# Patient Record
Sex: Female | Born: 1990 | Race: Black or African American | Hispanic: No | Marital: Single | Smoking: Never smoker
Health system: Southern US, Community
[De-identification: ages and names within clinical notes are randomized; demographics above are authoritative.]

## PROBLEM LIST (undated history)

## (undated) ENCOUNTER — Inpatient Hospital Stay (HOSPITAL_COMMUNITY): Payer: Self-pay

## (undated) DIAGNOSIS — Z862 Personal history of diseases of the blood and blood-forming organs and certain disorders involving the immune mechanism: Secondary | ICD-10-CM

## (undated) DIAGNOSIS — R51 Headache: Secondary | ICD-10-CM

## (undated) DIAGNOSIS — A749 Chlamydial infection, unspecified: Secondary | ICD-10-CM

## (undated) DIAGNOSIS — F909 Attention-deficit hyperactivity disorder, unspecified type: Secondary | ICD-10-CM

## (undated) DIAGNOSIS — O139 Gestational [pregnancy-induced] hypertension without significant proteinuria, unspecified trimester: Secondary | ICD-10-CM

## (undated) DIAGNOSIS — R519 Headache, unspecified: Secondary | ICD-10-CM

## (undated) DIAGNOSIS — O24419 Gestational diabetes mellitus in pregnancy, unspecified control: Secondary | ICD-10-CM

## (undated) DIAGNOSIS — I10 Essential (primary) hypertension: Secondary | ICD-10-CM

## (undated) HISTORY — PX: INDUCED ABORTION: SHX677

## (undated) HISTORY — DX: Attention-deficit hyperactivity disorder, unspecified type: F90.9

---
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
## 2008-03-18 ENCOUNTER — Emergency Department (HOSPITAL_COMMUNITY): Admission: EM | Admit: 2008-03-18 | Discharge: 2008-03-18 | Payer: Self-pay | Admitting: Emergency Medicine

## 2010-02-06 ENCOUNTER — Emergency Department (HOSPITAL_COMMUNITY)
Admission: EM | Admit: 2010-02-06 | Discharge: 2010-02-06 | Payer: Self-pay | Source: Home / Self Care | Admitting: Emergency Medicine

## 2010-04-28 LAB — POCT PREGNANCY, URINE: Preg Test, Ur: NEGATIVE

## 2010-11-06 LAB — POCT URINALYSIS DIP (DEVICE)
Glucose, UA: NEGATIVE
Hgb urine dipstick: NEGATIVE
Nitrite: NEGATIVE
Operator id: 247071
Specific Gravity, Urine: 1.025
Urobilinogen, UA: 1

## 2010-11-06 LAB — POCT PREGNANCY, URINE
Operator id: 247071
Preg Test, Ur: POSITIVE

## 2010-11-10 LAB — GC/CHLAMYDIA PROBE AMP, GENITAL: GC Probe Amp, Genital: POSITIVE — AB

## 2010-11-10 LAB — URINALYSIS, ROUTINE W REFLEX MICROSCOPIC
Glucose, UA: NEGATIVE
Hgb urine dipstick: NEGATIVE
Protein, ur: NEGATIVE
pH: 8

## 2010-11-10 LAB — URINE MICROSCOPIC-ADD ON

## 2010-11-10 LAB — WET PREP, GENITAL: Trich, Wet Prep: NONE SEEN

## 2010-11-13 LAB — URINALYSIS, ROUTINE W REFLEX MICROSCOPIC
Glucose, UA: NEGATIVE
Ketones, ur: NEGATIVE
Leukocytes, UA: NEGATIVE
Nitrite: NEGATIVE
Protein, ur: 30 — AB
Urobilinogen, UA: 0.2

## 2010-11-13 LAB — URINE MICROSCOPIC-ADD ON

## 2010-11-14 LAB — WET PREP, GENITAL: Trich, Wet Prep: NONE SEEN

## 2010-11-14 LAB — URINE MICROSCOPIC-ADD ON

## 2010-11-14 LAB — URINALYSIS, ROUTINE W REFLEX MICROSCOPIC
Glucose, UA: NEGATIVE
Ketones, ur: NEGATIVE
Protein, ur: NEGATIVE
Urobilinogen, UA: 0.2

## 2010-11-14 LAB — RAPID URINE DRUG SCREEN, HOSP PERFORMED
Amphetamines: NOT DETECTED
Benzodiazepines: NOT DETECTED
Cocaine: NOT DETECTED

## 2010-12-02 LAB — POCT PREGNANCY, URINE: Preg Test, Ur: NEGATIVE

## 2011-02-17 NOTE — L&D Delivery Note (Signed)
Delivery Note At 4:22 PM a viable female was delivered via  (Presentation: ;  ).  APGAR: , ; weight .   Placenta status: , .  Cord:  with the following complications: .  Cord pH: not done  Anesthesia:   Episiotomy:  Lacerations:  Suture Repair: 2.0 Est. Blood Loss (mL):   Mom to postpartum.  Baby to nursery-stable.  Hadyn Blanck A 08/15/2011, 4:42 PM

## 2011-03-23 ENCOUNTER — Inpatient Hospital Stay (HOSPITAL_COMMUNITY)
Admission: AD | Admit: 2011-03-23 | Discharge: 2011-03-23 | Disposition: A | Discharge status: Home / Self Care | Payer: Medicaid Other | Source: Ambulatory Visit | Attending: Family Medicine | Admitting: Family Medicine

## 2011-03-23 ENCOUNTER — Encounter (HOSPITAL_COMMUNITY): Payer: Self-pay

## 2011-03-23 ENCOUNTER — Inpatient Hospital Stay (HOSPITAL_COMMUNITY): Payer: Medicaid Other

## 2011-03-23 DIAGNOSIS — R109 Unspecified abdominal pain: Secondary | ICD-10-CM | POA: Insufficient documentation

## 2011-03-23 DIAGNOSIS — O99891 Other specified diseases and conditions complicating pregnancy: Secondary | ICD-10-CM | POA: Insufficient documentation

## 2011-03-23 DIAGNOSIS — Z3480 Encounter for supervision of other normal pregnancy, unspecified trimester: Secondary | ICD-10-CM

## 2011-03-23 HISTORY — DX: Chlamydial infection, unspecified: A74.9

## 2011-03-23 LAB — URINALYSIS, ROUTINE W REFLEX MICROSCOPIC
Glucose, UA: NEGATIVE mg/dL
Hgb urine dipstick: NEGATIVE
Leukocytes, UA: NEGATIVE
pH: 6.5 (ref 5.0–8.0)

## 2011-03-23 NOTE — ED Provider Notes (Signed)
Monique Reeve Stimpson21 y.o.G3P1011 @UnknownNo  chief complaint on file.   SUBJECTIVE  HPI: Presents with suprapubic abdominal pain and dysuria. She endorses urinary frequency but no urgency or gross hematuria. Denies back pain. She's not had an ultrasound that she had prenatal care in prison in [place] including blood work, pelvic and cultures/Pap. LMP is unsure. States she went to the health department  for her first pregnancy but intends to go to Dr. Gaynell Face for this pregnancy. Denies leakage of fluid, vaginal bleeding or other known pregnancy problems. Reports good fetal movement.  Past Medical History  Diagnosis Date  . Chlamydia    History reviewed. No pertinent past surgical history. History   Social History  . Marital Status: Single    Spouse Name: N/A    Number of Children: N/A  . Years of Education: N/A   Occupational History  . Not on file.   Social History Main Topics  . Smoking status: Never Smoker   . Smokeless tobacco: Never Used  . Alcohol Use: No  . Drug Use: Yes    Special: Marijuana     Last used in October  . Sexually Active: Yes     Intercourse last night   Other Topics Concern  . Not on file   Social History Narrative  . No narrative on file   No current facility-administered medications on file prior to encounter.   No current outpatient prescriptions on file prior to encounter.   Not on File  ROS: Pertinent items in HPI  OBJECTIVE Blood pressure 119/67, pulse 101, temperature 98.3 F (36.8 C), temperature source Oral, resp. rate 16, height 5\' 3"  (1.6 m), weight 64.139 kg (141 lb 6.4 oz), SpO2 99.00%. GENERAL: Well-developed, well-nourished female in no acute distress.  LUNGS: Clear to auscultation bilaterally.  HEART: Regular rate and rhythm. ABDOMEN: Soft, nontender. Fundus at umbilicus. FHR 152 EXTREMITIES: Nontender, no edema EXTERNAL GENITALIA: normal VAGINA: physiologic discharge CERVIX: long/closed/high  Results for orders placed  during the hospital encounter of 03/23/11 (from the past 24 hour(s))  URINALYSIS, ROUTINE W REFLEX MICROSCOPIC     Status: Abnormal   Collection Time   03/23/11  3:20 PM      Component Value Range   Color, Urine YELLOW  YELLOW    APPearance HAZY (*) CLEAR    Specific Gravity, Urine 1.025  1.005 - 1.030    pH 6.5  5.0 - 8.0    Glucose, UA NEGATIVE  NEGATIVE (mg/dL)   Hgb urine dipstick NEGATIVE  NEGATIVE    Bilirubin Urine NEGATIVE  NEGATIVE    Ketones, ur NEGATIVE  NEGATIVE (mg/dL)   Protein, ur NEGATIVE  NEGATIVE (mg/dL)   Urobilinogen, UA 0.2  0.0 - 1.0 (mg/dL)   Nitrite NEGATIVE  NEGATIVE    Leukocytes, UA NEGATIVE  NEGATIVE   [place]: Normal anatomy, normal AFV, cx3.3 cm  ASSESSMENT Normal pregnancy at [redacted]w[redacted]d  PLAN Pregnancy precautions. Continue prenatal vitamins. Call Dr. Gaynell Face for prenatal appointment as soon as possible

## 2011-03-23 NOTE — Progress Notes (Signed)
Patient states her last period was in September but unsure when. Has been in jail for the past 90 days. Wants to know how far pregnant she is. Denies any pain, bleeding or leaking at this time. Has had some lower abdominal pain off and on. Has felt fetal movement.

## 2011-03-24 NOTE — ED Provider Notes (Signed)
Chart reviewed and agree with management and plan.  

## 2011-04-13 ENCOUNTER — Inpatient Hospital Stay (HOSPITAL_COMMUNITY)
Admission: AD | Admit: 2011-04-13 | Discharge: 2011-04-13 | Disposition: A | Discharge status: Home / Self Care | Payer: Medicaid Other | Source: Ambulatory Visit | Attending: Obstetrics | Admitting: Obstetrics

## 2011-04-13 ENCOUNTER — Inpatient Hospital Stay (HOSPITAL_COMMUNITY): Payer: Medicaid Other

## 2011-04-13 ENCOUNTER — Encounter (HOSPITAL_COMMUNITY): Payer: Self-pay | Admitting: *Deleted

## 2011-04-13 DIAGNOSIS — O99891 Other specified diseases and conditions complicating pregnancy: Secondary | ICD-10-CM | POA: Insufficient documentation

## 2011-04-13 DIAGNOSIS — R51 Headache: Secondary | ICD-10-CM | POA: Insufficient documentation

## 2011-04-13 LAB — URINE MICROSCOPIC-ADD ON

## 2011-04-13 LAB — CBC
Hemoglobin: 9.5 g/dL — ABNORMAL LOW (ref 12.0–15.0)
RBC: 3.32 MIL/uL — ABNORMAL LOW (ref 3.87–5.11)
WBC: 10.1 10*3/uL (ref 4.0–10.5)

## 2011-04-13 LAB — URINALYSIS, ROUTINE W REFLEX MICROSCOPIC
Leukocytes, UA: NEGATIVE
Nitrite: NEGATIVE
Specific Gravity, Urine: 1.025 (ref 1.005–1.030)
pH: 6 (ref 5.0–8.0)

## 2011-04-13 MED ORDER — BUTALBITAL-APAP-CAFFEINE 50-325-40 MG PO TABS
2.0000 | ORAL_TABLET | Freq: Once | ORAL | Status: AC
  Administered 2011-04-13: 2 via ORAL
  Filled 2011-04-13: qty 2

## 2011-04-13 MED ORDER — BUTALBITAL-APAP-CAFFEINE 50-325-40 MG PO TABS: 1.0000 | ORAL_TABLET | Freq: Four times a day (QID) | ORAL | Status: DC | PRN

## 2011-04-13 NOTE — ED Provider Notes (Signed)
History   Pt presents today c/o passing out on Thursday of last week. She states she was told to come to the MAU on that day, but she didn't have a ride. She states she has been doing well and has not had any trouble since that time other than occ HA that comes and goes. She denies blurry vision, CP, SOB, heart palpitations, or any other sx at this time.  Chief Complaint  Patient presents with  . Loss of Consciousness  . Dizziness   HPI  OB History    Grav Para Term Preterm Abortions TAB SAB Ect Mult Living   3 1 1  0 1 1 0 0 0 1      Past Medical History  Diagnosis Date  . Chlamydia     No past surgical history on file.  Family History  Problem Relation Age of Onset  . Anesthesia problems Neg Hx     History  Substance Use Topics  . Smoking status: Never Smoker   . Smokeless tobacco: Never Used  . Alcohol Use: No    Allergies: No Known Allergies  Prescriptions prior to admission  Medication Sig Dispense Refill  . acetaminophen (TYLENOL) 325 MG tablet Take 650 mg by mouth every 6 (six) hours as needed. Patient used this medication for a headache.      . nitrofurantoin, macrocrystal-monohydrate, (MACROBID) 100 MG capsule Take 100 mg by mouth 2 (two) times daily. Pt is on the third day of a 7 day course of medication.        Review of Systems  Constitutional: Negative for fever and chills.  HENT: Negative for hearing loss.   Eyes: Negative for blurred vision, double vision, photophobia, pain and discharge.  Respiratory: Negative for cough, hemoptysis, sputum production, shortness of breath and wheezing.   Cardiovascular: Negative for chest pain and palpitations.  Gastrointestinal: Negative for nausea, vomiting, abdominal pain, diarrhea and constipation.  Genitourinary: Negative for dysuria, urgency, frequency and hematuria.  Neurological: Positive for headaches. Negative for dizziness and tingling.  Psychiatric/Behavioral: Negative for depression and suicidal ideas.    Physical Exam   Blood pressure 108/63, pulse 88, temperature 97.6 F (36.4 C), temperature source Oral, resp. rate 18, height 5\' 4"  (1.626 m), weight 152 lb (68.947 kg), SpO2 100.00%.  Physical Exam  Nursing note and vitals reviewed. Constitutional: She is oriented to person, place, and time. She appears well-developed and well-nourished. No distress.  HENT:  Head: Normocephalic and atraumatic.  Eyes: Conjunctivae and EOM are normal. Pupils are equal, round, and reactive to light.  Neck: Normal range of motion. Neck supple.  GI: Soft. She exhibits no distension and no mass. There is no tenderness. There is no rebound and no guarding.  Neurological: She is alert and oriented to person, place, and time. No cranial nerve deficit. Coordination normal.  Skin: Skin is warm and dry. She is not diaphoretic.  Psychiatric: She has a normal mood and affect. Her behavior is normal. Judgment and thought content normal.    MAU Course  Procedures  On further discussion with pt, she now states that she has had a continuous HA and dizziness since she fell on Thursday. She states she fell and hit her head and she has had the sx since that time. Therefore, will get head CT.  Results for orders placed during the hospital encounter of 04/13/11 (from the past 72 hour(s))  URINALYSIS, ROUTINE W REFLEX MICROSCOPIC     Status: Abnormal   Collection Time  04/13/11 12:00 PM      Component Value Range Comment   Color, Urine YELLOW  YELLOW     APPearance CLOUDY (*) CLEAR     Specific Gravity, Urine 1.025  1.005 - 1.030     pH 6.0  5.0 - 8.0     Glucose, UA NEGATIVE  NEGATIVE (mg/dL)    Hgb urine dipstick NEGATIVE  NEGATIVE     Bilirubin Urine NEGATIVE  NEGATIVE     Ketones, ur NEGATIVE  NEGATIVE (mg/dL)    Protein, ur 30 (*) NEGATIVE (mg/dL)    Urobilinogen, UA 1.0  0.0 - 1.0 (mg/dL)    Nitrite NEGATIVE  NEGATIVE     Leukocytes, UA NEGATIVE  NEGATIVE    URINE MICROSCOPIC-ADD ON     Status: Abnormal    Collection Time   04/13/11 12:00 PM      Component Value Range Comment   Squamous Epithelial / LPF MANY (*) RARE     WBC, UA 0-2  <3 (WBC/hpf)    Bacteria, UA FEW (*) RARE    CBC     Status: Abnormal   Collection Time   04/13/11 12:15 PM      Component Value Range Comment   WBC 10.1  4.0 - 10.5 (K/uL)    RBC 3.32 (*) 3.87 - 5.11 (MIL/uL)    Hemoglobin 9.5 (*) 12.0 - 15.0 (g/dL)    HCT 16.1 (*) 09.6 - 46.0 (%)    MCV 91.3  78.0 - 100.0 (fL)    MCH 28.6  26.0 - 34.0 (pg)    MCHC 31.4  30.0 - 36.0 (g/dL)    RDW 04.5  40.9 - 81.1 (%)    Platelets 184  150 - 400 (K/uL)     Pt HA improved following fioricet. Assessment and Plan  HA: discussed with pt at length. Will give Rx for fioricet. She will f/u with Dr. Gaynell Face. Discussed diet, activity, risks, and precautions.  Clinton Gallant. Monika Chestang III, DrHSc, MPAS, PA-C  04/13/2011, 12:16 PM   Henrietta Hoover, PA 04/13/11 1321

## 2011-04-13 NOTE — Discharge Instructions (Signed)
Headache °Headaches are caused by many different problems. Most commonly, headache is caused by muscle tension from an injury, fatigue, or emotional upset. Excessive muscle contractions in the scalp and neck result in a headache that often feels like a tight band around the head. Tension headaches often have areas of tenderness over the scalp and the back of the neck. These headaches may last for hours, days, or longer, and some may contribute to migraines in those who have migraine problems. °Migraines usually cause a throbbing headache, which is made worse by activity. Sometimes only one side of the head hurts. Nausea, vomiting, eye pain, and avoidance of food are common with migraines. Visual symptoms such as light sensitivity, blind spots, or flashing lights may also occur. Loud noises may worsen migraine headaches. Many factors may cause migraine headaches: °· Emotional stress, lack of sleep, and menstrual periods.  °· Alcohol and some drugs (such as birth control pills).  °· Diet factors (fasting, caffeine, food preservatives, chocolate).  °· Environmental factors (weather changes, bright lights, odors, smoke).  °Other causes of headaches include minor injuries to the head. Arthritis in the neck; problems with the jaw, eyes, ears, or nose are also causes of headaches. Allergies, drugs, alcohol, and exposure to smoke can also cause moderate headaches. Rebound headaches can occur if someone uses pain medications for a long period of time and then stops. Less commonly, blood vessel problems in the neck and brain (including stroke) can cause various types of headache. °Treatment of headaches includes medicines for pain and relaxation. Ice packs or heat applied to the back of the head and neck help some people. Massaging the shoulders, neck and scalp are often very useful. Relaxation techniques and stretching can help prevent these headaches. Avoid alcohol and cigarette smoking as these tend to make headaches  worse. Please see your caregiver if your headache is not better in 2 days.  °SEEK IMMEDIATE MEDICAL CARE IF:  °· You develop a high fever, chills, or repeated vomiting.  °· You faint or have difficulty with vision.  °· You develop unusual numbness or weakness of your arms or legs.  °· Relief of pain is inadequate with medication, or you develop severe pain.  °· You develop confusion, or neck stiffness.  °· You have a worsening of a headache or do not obtain relief.  °Document Released: 02/02/2005 Document Revised: 10/15/2010 Document Reviewed: 07/29/2006 °[employer]® Patient Information ©2012 [employer]. °

## 2011-04-13 NOTE — Progress Notes (Addendum)
Light headed since Thurs, fainted on Thurs.  Did not have a ride in.  Happened at school, 'only for a second', hit the back of her head, no tenderness to touch.

## 2011-04-13 NOTE — ED Notes (Signed)
States that she has had her headache and her dizziness since Thursday ( 4 days ago); states that she was not feeling well and was lightheaded on Thursday before she passed out and fell; FHR dopplered at  156; denies any cramping,vaginal discharge or spotting;

## 2011-05-07 LAB — OB RESULTS CONSOLE HEPATITIS B SURFACE ANTIGEN: Hepatitis B Surface Ag: NEGATIVE

## 2011-05-07 LAB — OB RESULTS CONSOLE RPR: RPR: NONREACTIVE

## 2011-05-07 LAB — OB RESULTS CONSOLE ABO/RH: RH Type: POSITIVE

## 2011-05-07 LAB — OB RESULTS CONSOLE HIV ANTIBODY (ROUTINE TESTING): HIV: NONREACTIVE

## 2011-07-09 ENCOUNTER — Encounter (HOSPITAL_COMMUNITY): Payer: Self-pay | Admitting: *Deleted

## 2011-07-09 ENCOUNTER — Inpatient Hospital Stay (HOSPITAL_COMMUNITY)
Admission: AD | Admit: 2011-07-09 | Discharge: 2011-07-09 | Disposition: A | Discharge status: Home / Self Care | Payer: Medicaid Other | Source: Ambulatory Visit | Attending: Obstetrics | Admitting: Obstetrics

## 2011-07-09 DIAGNOSIS — O4700 False labor before 37 completed weeks of gestation, unspecified trimester: Secondary | ICD-10-CM

## 2011-07-09 LAB — URINALYSIS, ROUTINE W REFLEX MICROSCOPIC
Bilirubin Urine: NEGATIVE
Ketones, ur: NEGATIVE mg/dL
Nitrite: NEGATIVE
Protein, ur: NEGATIVE mg/dL
Urobilinogen, UA: 0.2 mg/dL (ref 0.0–1.0)

## 2011-07-09 NOTE — MAU Provider Note (Signed)
Emeterio Reeve Stimpson21 y.o.G3P1011 @[redacted]w[redacted]d  by LMP No chief complaint on file.    First Provider Initiated Contact with Patient 07/09/11 2152      SUBJECTIVE  HPI: Pt presents to MAU with contractions.  She initially says contractions are 5 minutes apart but reports she has had 2-3 contractions per hour for last couple of hours.  She reports she has not had much water to drink today.  She reports good fetal movement, denies LOF, vaginal bleeding, vaginal itching/burning, urinary symptoms, h/a, dizziness, n/v, or fever/chills.    Past Medical History  Diagnosis Date  . Chlamydia    Past Surgical History  Procedure Date  . Induced abortion    History   Social History  . Marital Status: Single    Spouse Name: N/A    Number of Children: N/A  . Years of Education: N/A   Occupational History  . Not on file.   Social History Main Topics  . Smoking status: Never Smoker   . Smokeless tobacco: Never Used  . Alcohol Use: No  . Drug Use: Yes    Special: Marijuana     Last used in October  . Sexually Active: Yes     Intercourse last night   Other Topics Concern  . Not on file   Social History Narrative  . No narrative on file   No current facility-administered medications on file prior to encounter.   Current Outpatient Prescriptions on File Prior to Encounter  Medication Sig Dispense Refill  . acetaminophen (TYLENOL) 325 MG tablet Take 650 mg by mouth every 6 (six) hours as needed. Patient used this medication for a headache.       No Known Allergies  ROS: Pertinent items in HPI  OBJECTIVE Blood pressure 114/62, pulse 95, temperature 98.4 F (36.9 C), temperature source Oral, resp. rate 20, height 5\' 5"  (1.651 m), weight 76.431 kg (168 lb 8 oz).  GENERAL: Well-developed, well-nourished female in no acute distress.  HEENT: Normocephalic, good dentition HEART: normal rate RESP: normal effort ABDOMEN: Soft, nontender EXTREMITIES: Nontender, no edema NEURO: Alert and  oriented  Cervix 1/30/high, soft, posterior  FHR baseline 135 with moderate variability, positive accels, no decels [place] with no contractions, pt denies contractions since monitor placed  LAB RESULTS Results for orders placed during the hospital encounter of 07/09/11 (from the past 24 hour(s))  URINALYSIS, ROUTINE W REFLEX MICROSCOPIC     Status: Normal   Collection Time   07/09/11  9:30 PM      Component Value Range   Color, Urine YELLOW  YELLOW    APPearance CLEAR  CLEAR    Specific Gravity, Urine 1.010  1.005 - 1.030    pH 8.0  5.0 - 8.0    Glucose, UA NEGATIVE  NEGATIVE (mg/dL)   Hgb urine dipstick NEGATIVE  NEGATIVE    Bilirubin Urine NEGATIVE  NEGATIVE    Ketones, ur NEGATIVE  NEGATIVE (mg/dL)   Protein, ur NEGATIVE  NEGATIVE (mg/dL)   Urobilinogen, UA 0.2  0.0 - 1.0 (mg/dL)   Nitrite NEGATIVE  NEGATIVE    Leukocytes, UA NEGATIVE  NEGATIVE      ASSESSMENT Threatened preterm labor  PLAN D/C home per Dr Gaynell Face with labor precautions F/U in office Return to MAU as needed   LEFTWICH-KIRBY, Destenee Guerry 07/09/2011 9:54 PM

## 2011-07-09 NOTE — Discharge Instructions (Signed)
Pregnancy - Third Trimester The third trimester of pregnancy (the last 3 months) is a period of the most rapid growth for you and your baby. The baby approaches a length of 20 inches and a weight of 6 to 10 pounds. The baby is adding on fat and getting ready for life outside your body. While inside, babies have periods of sleeping and waking, suck their thumbs, and hiccups. You can often feel small contractions of the uterus. This is false labor. It is also called Braxton-Hicks contractions. This is like a practice for labor. The usual problems in this stage of pregnancy include more difficulty breathing, swelling of the hands and feet from water retention, and having to urinate more often because of the uterus and baby pressing on your bladder.  PRENATAL EXAMS  Blood work may continue to be done during prenatal exams. These tests are done to check on your health and the probable health of your baby. Blood work is used to follow your blood levels (hemoglobin). Anemia (low hemoglobin) is common during pregnancy. Iron and vitamins are given to help prevent this. You may also continue to be checked for diabetes. Some of the past blood tests may be done again.   The size of the uterus is measured during each visit. This makes sure your baby is growing properly according to your pregnancy dates.   Your blood pressure is checked every prenatal visit. This is to make sure you are not getting toxemia.   Your urine is checked every prenatal visit for infection, diabetes and protein.   Your weight is checked at each visit. This is done to make sure gains are happening at the suggested rate and that you and your baby are growing normally.   Sometimes, an ultrasound is performed to confirm the position and the proper growth and development of the baby. This is a test done that bounces harmless sound waves off the baby so your caregiver can more accurately determine due dates.   Discuss the type of pain  medication and anesthesia you will have during your labor and delivery.   Discuss the possibility and anesthesia if a Cesarean Section might be necessary.   Inform your caregiver if there is any mental or physical violence at home.  Sometimes, a specialized non-stress test, contraction stress test and biophysical profile are done to make sure the baby is not having a problem. Checking the amniotic fluid surrounding the baby is called an amniocentesis. The amniotic fluid is removed by sticking a needle into the belly (abdomen). This is sometimes done near the end of pregnancy if an early delivery is required. In this case, it is done to help make sure the baby's lungs are mature enough for the baby to live outside of the womb. If the lungs are not mature and it is unsafe to deliver the baby, an injection of cortisone medication is given to the mother 1 to 2 days before the delivery. This helps the baby's lungs mature and makes it safer to deliver the baby. CHANGES OCCURING IN THE THIRD TRIMESTER OF PREGNANCY Your body goes through many changes during pregnancy. They vary from person to person. Talk to your caregiver about changes you notice and are concerned about.  During the last trimester, you have probably had an increase in your appetite. It is normal to have cravings for certain foods. This varies from person to person and pregnancy to pregnancy.   You may begin to get stretch marks on your hips,   abdomen, and breasts. These are normal changes in the body during pregnancy. There are no exercises or medications to take which prevent this change.   Constipation may be treated with a stool softener or adding bulk to your diet. Drinking lots of fluids, fiber in vegetables, fruits, and whole grains are helpful.   Exercising is also helpful. If you have been very active up until your pregnancy, most of these activities can be continued during your pregnancy. If you have been less active, it is helpful  to start an exercise program such as walking. Consult your caregiver before starting exercise programs.   Avoid all smoking, alcohol, un-prescribed drugs, herbs and "street drugs" during your pregnancy. These chemicals affect the formation and growth of the baby. Avoid chemicals throughout the pregnancy to ensure the delivery of a healthy infant.   Backache, varicose veins and hemorrhoids may develop or get worse.   You will tire more easily in the third trimester, which is normal.   The baby's movements may be stronger and more often.   You may become short of breath easily.   Your belly button may stick out.   A yellow discharge may leak from your breasts called colostrum.   You may have a bloody mucus discharge. This usually occurs a few days to a week before labor begins.  HOME CARE INSTRUCTIONS   Keep your caregiver's appointments. Follow your caregiver's instructions regarding medication use, exercise, and diet.   During pregnancy, you are providing food for you and your baby. Continue to eat regular, well-balanced meals. Choose foods such as meat, fish, milk and other low fat dairy products, vegetables, fruits, and whole-grain breads and cereals. Your caregiver will tell you of the ideal weight gain.   A physical sexual relationship may be continued throughout pregnancy if there are no other problems such as early (premature) leaking of amniotic fluid from the membranes, vaginal bleeding, or belly (abdominal) pain.   Exercise regularly if there are no restrictions. Check with your caregiver if you are unsure of the safety of your exercises. Greater weight gain will occur in the last 2 trimesters of pregnancy. Exercising helps:   Control your weight.   Get you in shape for labor and delivery.   You lose weight after you deliver.   Rest a lot with legs elevated, or as needed for leg cramps or low back pain.   Wear a good support or jogging bra for breast tenderness during  pregnancy. This may help if worn during sleep. Pads or tissues may be used in the bra if you are leaking colostrum.   Do not use hot tubs, steam rooms, or saunas.   Wear your seat belt when driving. This protects you and your baby if you are in an accident.   Avoid raw meat, cat litter boxes and soil used by cats. These carry germs that can cause birth defects in the baby.   It is easier to loose urine during pregnancy. Tightening up and strengthening the pelvic muscles will help with this problem. You can practice stopping your urination while you are going to the bathroom. These are the same muscles you need to strengthen. It is also the muscles you would use if you were trying to stop from passing gas. You can practice tightening these muscles up 10 times a set and repeating this about 3 times per day. Once you know what muscles to tighten up, do not perform these exercises during urination. It is more likely   to cause an infection by backing up the urine.   Ask for help if you have financial, counseling or nutritional needs during pregnancy. Your caregiver will be able to offer counseling for these needs as well as refer you for other special needs.   Make a list of emergency phone numbers and have them available.   Plan on getting help from family or friends when you go home from the hospital.   Make a trial run to the hospital.   Take prenatal classes with the father to understand, practice and ask questions about the labor and delivery.   Prepare the baby's room/nursery.   Do not travel out of the city unless it is absolutely necessary and with the advice of your caregiver.   Wear only low or no heal shoes to have better balance and prevent falling.  MEDICATIONS AND DRUG USE IN PREGNANCY  Take prenatal vitamins as directed. The vitamin should contain 1 milligram of folic acid. Keep all vitamins out of reach of children. Only a couple vitamins or tablets containing iron may be fatal  to a baby or young child when ingested.   Avoid use of all medications, including herbs, over-the-counter medications, not prescribed or suggested by your caregiver. Only take over-the-counter or prescription medicines for pain, discomfort, or fever as directed by your caregiver. Do not use aspirin, ibuprofen (Motrin, Advil, Nuprin) or naproxen (Aleve) unless OK'd by your caregiver.   Let your caregiver also know about herbs you may be using.   Alcohol is related to a number of birth defects. This includes fetal alcohol syndrome. All alcohol, in any form, should be avoided completely. Smoking will cause low birth rate and premature babies.   Street/illegal drugs are very harmful to the baby. They are absolutely forbidden. A baby born to an addicted mother will be addicted at birth. The baby will go through the same withdrawal an adult does.  SEEK MEDICAL CARE IF: You have any concerns or worries during your pregnancy. It is better to call with your questions if you feel they cannot wait, rather than worry about them. DECISIONS ABOUT CIRCUMCISION You may or may not know the sex of your baby. If you know your baby is a boy, it may be time to think about circumcision. Circumcision is the removal of the foreskin of the penis. This is the skin that covers the sensitive end of the penis. There is no proven medical need for this. Often this decision is made on what is popular at the time or based upon religious beliefs and social issues. You can discuss these issues with your caregiver or pediatrician. SEEK IMMEDIATE MEDICAL CARE IF:   An unexplained oral temperature above 102 F (38.9 C) develops, or as your caregiver suggests.   You have leaking of fluid from the vagina (birth canal). If leaking membranes are suspected, take your temperature and tell your caregiver of this when you call.   There is vaginal spotting, bleeding or passing clots. Tell your caregiver of the amount and how many pads are  used.   You develop a bad smelling vaginal discharge with a change in the color from clear to white.   You develop vomiting that lasts more than 24 hours.   You develop chills or fever.   You develop shortness of breath.   You develop burning on urination.   You loose more than 2 pounds of weight or gain more than 2 pounds of weight or as suggested by your   caregiver.   You notice sudden swelling of your face, hands, and feet or legs.   You develop belly (abdominal) pain. Round ligament discomfort is a common non-cancerous (benign) cause of abdominal pain in pregnancy. Your caregiver still must evaluate you.   You develop a severe headache that does not go away.   You develop visual problems, blurred or double vision.   If you have not felt your baby move for more than 1 hour. If you think the baby is not moving as much as usual, eat something with sugar in it and lie down on your left side for an hour. The baby should move at least 4 to 5 times per hour. Call right away if your baby moves less than that.   You fall, are in a car accident or any kind of trauma.   There is mental or physical violence at home.  Document Released: 01/27/2001 Document Revised: 01/22/2011 Document Reviewed: 08/01/2008 [employer] Patient Information 2012 [employer]. 

## 2011-07-09 NOTE — MAU Note (Signed)
PT SAYS WHEN SHE WIPED  AT 12N- SHE SAW  RED BLOOD ON TOILET PAPER.  NO PAD ON.  SAYS IS HAVING UC - STARTED AT 12 NOON.  WENT TO SLEEP - AWOKE AT  830PM- STILL FEELS THEM. WENT TO DR MARSHALL YESTERDAY.

## 2011-07-12 ENCOUNTER — Inpatient Hospital Stay (HOSPITAL_COMMUNITY)
Admission: AD | Admit: 2011-07-12 | Discharge: 2011-07-12 | Disposition: A | Discharge status: Home / Self Care | Payer: Medicaid Other | Source: Ambulatory Visit | Attending: Obstetrics | Admitting: Obstetrics

## 2011-07-12 ENCOUNTER — Encounter (HOSPITAL_COMMUNITY): Payer: Self-pay | Admitting: *Deleted

## 2011-07-12 DIAGNOSIS — O4700 False labor before 37 completed weeks of gestation, unspecified trimester: Secondary | ICD-10-CM | POA: Insufficient documentation

## 2011-07-12 DIAGNOSIS — O479 False labor, unspecified: Secondary | ICD-10-CM

## 2011-07-12 LAB — URINALYSIS, ROUTINE W REFLEX MICROSCOPIC
Bilirubin Urine: NEGATIVE
Hgb urine dipstick: NEGATIVE
Ketones, ur: NEGATIVE mg/dL
Protein, ur: NEGATIVE mg/dL
Specific Gravity, Urine: 1.015 (ref 1.005–1.030)
Urobilinogen, UA: 1 mg/dL (ref 0.0–1.0)

## 2011-07-12 LAB — URINE MICROSCOPIC-ADD ON

## 2011-07-12 NOTE — MAU Provider Note (Signed)
Chief Complaint:  Contractions   None     HPI  Monique Gamble is a 21 y.o. G3P1011 at [redacted]w[redacted]d presenting with contractions. Denies contractions, leakage of fluid or vaginal bleeding. Good fetal movement.   Past Medical History: Past Medical History  Diagnosis Date  . Chlamydia     Past Surgical History: Past Surgical History  Procedure Date  . Induced abortion     Family History: Family History  Problem Relation Age of Onset  . Anesthesia problems Neg Hx   . Hypertension Mother     Social History: History  Substance Use Topics  . Smoking status: Never Smoker   . Smokeless tobacco: Never Used  . Alcohol Use: No    Allergies: No Known Allergies  Meds:  Prescriptions prior to admission  Medication Sig Dispense Refill  . acetaminophen (TYLENOL) 325 MG tablet Take 650 mg by mouth every 6 (six) hours as needed. Patient used this medication for a headache.      . calcium carbonate (TUMS - DOSED IN MG ELEMENTAL CALCIUM) 500 MG chewable tablet Chew 1 tablet by mouth daily. As needed for heartburn      . OVER THE COUNTER MEDICATION Take 1 tablet by mouth daily. Patient takes an over-the-counter chewable multivitamin          Physical Exam  Blood pressure 124/75, pulse 116, temperature 98.3 F (36.8 C), resp. rate 20, height 5\' 5"  (1.651 m), weight 77.202 kg (170 lb 3.2 oz). GENERAL: Well-developed, well-nourished female in no acute distress.  HEENT: normocephalic HEART: normal rate RESP: normal effort ABDOMEN: Soft, nontender, nondistended, gravid.  EXTREMITIES: Nontender, no edema NEURO: alert and oriented  Dilation: 1 Effacement (%): 50 Cervical Position: Posterior Station: -3 Presentation: Vertex Exam by:: Ivonne Andrew CNM    FHT:  Baseline 130 , moderate variability, accelerations present, no decelerations Contractions: q 4-7 mins, mild   Labs:    Imaging:    Assessment: 1. Preterm contractions     Plan: D/C home pr consult w/ Dr.  Tamela Oddi Follow-up Information    Follow up with MARSHALL,BERNARD A, MD. (as scheduled or MAU as needed if symptoms worsen)    Contact information:   [address] [place] [address] [phone]         Medication List  As of 07/12/2011  5:19 AM   CONTINUE taking these medications         acetaminophen 325 MG tablet   Commonly known as: TYLENOL      calcium carbonate 500 MG chewable tablet   Commonly known as: TUMS - dosed in mg elemental calcium      OVER THE COUNTER MEDICATION          PTL precautions, FKCs  Akeria Hedstrom 5/26/20134:22 AM

## 2011-07-12 NOTE — MAU Note (Signed)
OK to d/c efm per V. SMith CNM 

## 2011-07-12 NOTE — MAU Note (Signed)
"  I've been having contractions for about an hour. Have not been able to sleep. Was here the other night with ctxs. "

## 2011-07-12 NOTE — Progress Notes (Signed)
Written and verbal d/c instructions given and understanding voiced. 

## 2011-07-24 ENCOUNTER — Inpatient Hospital Stay (HOSPITAL_COMMUNITY)
Admission: AD | Admit: 2011-07-24 | Discharge: 2011-07-24 | Disposition: A | Discharge status: Home / Self Care | Payer: Medicaid Other | Source: Ambulatory Visit | Attending: Obstetrics | Admitting: Obstetrics

## 2011-07-24 ENCOUNTER — Encounter (HOSPITAL_COMMUNITY): Payer: Self-pay | Admitting: *Deleted

## 2011-07-24 DIAGNOSIS — O4700 False labor before 37 completed weeks of gestation, unspecified trimester: Secondary | ICD-10-CM | POA: Insufficient documentation

## 2011-07-24 LAB — URINALYSIS, ROUTINE W REFLEX MICROSCOPIC
Bilirubin Urine: NEGATIVE
Nitrite: NEGATIVE
Specific Gravity, Urine: 1.015 (ref 1.005–1.030)
pH: 7.5 (ref 5.0–8.0)

## 2011-07-24 LAB — URINE MICROSCOPIC-ADD ON

## 2011-07-24 NOTE — Discharge Instructions (Signed)

## 2011-07-24 NOTE — MAU Note (Signed)
Patient states she is having irregular contractions, about one every hour. Denies any bleeding or leaking and reports good fetal movement.

## 2011-07-27 ENCOUNTER — Encounter (HOSPITAL_COMMUNITY): Payer: Self-pay | Admitting: *Deleted

## 2011-07-27 ENCOUNTER — Inpatient Hospital Stay (HOSPITAL_COMMUNITY)
Admission: AD | Admit: 2011-07-27 | Discharge: 2011-07-27 | Disposition: A | Discharge status: Home / Self Care | Payer: Medicaid Other | Source: Ambulatory Visit | Attending: Obstetrics | Admitting: Obstetrics

## 2011-07-27 DIAGNOSIS — O4700 False labor before 37 completed weeks of gestation, unspecified trimester: Secondary | ICD-10-CM | POA: Insufficient documentation

## 2011-07-27 LAB — URINALYSIS, ROUTINE W REFLEX MICROSCOPIC
Bilirubin Urine: NEGATIVE
Hgb urine dipstick: NEGATIVE
Protein, ur: NEGATIVE mg/dL
Urobilinogen, UA: 1 mg/dL (ref 0.0–1.0)

## 2011-07-27 NOTE — Discharge Instructions (Signed)

## 2011-07-27 NOTE — MAU Note (Signed)
Pt G3 P1 at 36.6wks having contractions all day.  Denies bleeding or leaking.  No problems with pregnancy.

## 2011-08-12 ENCOUNTER — Encounter (HOSPITAL_COMMUNITY): Payer: Self-pay | Admitting: *Deleted

## 2011-08-12 ENCOUNTER — Telehealth (HOSPITAL_COMMUNITY): Payer: Self-pay | Admitting: *Deleted

## 2011-08-12 NOTE — Telephone Encounter (Signed)
Preadmission screen  

## 2011-08-15 ENCOUNTER — Encounter (HOSPITAL_COMMUNITY): Payer: Self-pay

## 2011-08-15 ENCOUNTER — Inpatient Hospital Stay (HOSPITAL_COMMUNITY)
Admission: RE | Admit: 2011-08-15 | Discharge: 2011-08-17 | DRG: 775 | Disposition: A | Discharge status: Home / Self Care | Payer: Medicaid Other | Source: Ambulatory Visit | Attending: Obstetrics | Admitting: Obstetrics

## 2011-08-15 DIAGNOSIS — O99892 Other specified diseases and conditions complicating childbirth: Principal | ICD-10-CM | POA: Diagnosis present

## 2011-08-15 DIAGNOSIS — Z22330 Carrier of Group B streptococcus: Secondary | ICD-10-CM

## 2011-08-15 LAB — CBC
HCT: 30.3 % — ABNORMAL LOW (ref 36.0–46.0)
MCHC: 30.4 g/dL (ref 30.0–36.0)
MCV: 78.9 fL (ref 78.0–100.0)
RDW: 14.8 % (ref 11.5–15.5)

## 2011-08-15 LAB — ABO/RH: ABO/RH(D): O POS

## 2011-08-15 MED ORDER — DIPHENHYDRAMINE HCL 25 MG PO CAPS: 25.0000 mg | ORAL_CAPSULE | Freq: Four times a day (QID) | ORAL | Status: DC | PRN

## 2011-08-15 MED ORDER — PENICILLIN G POTASSIUM 5000000 UNITS IJ SOLR
5.0000 10*6.[IU] | Freq: Once | INTRAVENOUS | Status: AC
  Administered 2011-08-15: 5 10*6.[IU] via INTRAVENOUS
  Filled 2011-08-15: qty 5

## 2011-08-15 MED ORDER — IBUPROFEN 600 MG PO TABS
600.0000 mg | ORAL_TABLET | Freq: Four times a day (QID) | ORAL | Status: DC
  Administered 2011-08-15 – 2011-08-17 (×7): 600 mg via ORAL
  Filled 2011-08-15 (×2): qty 1

## 2011-08-15 MED ORDER — OXYTOCIN BOLUS FROM INFUSION
250.0000 mL | Freq: Once | INTRAVENOUS | Status: DC
  Filled 2011-08-15: qty 500

## 2011-08-15 MED ORDER — TERBUTALINE SULFATE 1 MG/ML IJ SOLN: 0.2500 mg | Freq: Once | INTRAMUSCULAR | Status: DC | PRN

## 2011-08-15 MED ORDER — LANOLIN HYDROUS EX OINT: TOPICAL_OINTMENT | CUTANEOUS | Status: DC | PRN

## 2011-08-15 MED ORDER — PRENATAL MULTIVITAMIN CH
1.0000 | ORAL_TABLET | Freq: Every day | ORAL | Status: DC
  Administered 2011-08-15 – 2011-08-17 (×3): 1 via ORAL
  Filled 2011-08-15 (×3): qty 1

## 2011-08-15 MED ORDER — CITRIC ACID-SODIUM CITRATE 334-500 MG/5ML PO SOLN: 30.0000 mL | ORAL | Status: DC | PRN

## 2011-08-15 MED ORDER — OXYTOCIN 40 UNITS IN LACTATED RINGERS INFUSION - SIMPLE MED
1.0000 m[IU]/min | INTRAVENOUS | Status: DC
  Administered 2011-08-15: 1 m[IU]/min via INTRAVENOUS
  Administered 2011-08-15: 7 m[IU]/min via INTRAVENOUS

## 2011-08-15 MED ORDER — FERROUS SULFATE 325 (65 FE) MG PO TABS
325.0000 mg | ORAL_TABLET | Freq: Two times a day (BID) | ORAL | Status: DC
  Administered 2011-08-15 – 2011-08-17 (×4): 325 mg via ORAL
  Filled 2011-08-15 (×4): qty 1

## 2011-08-15 MED ORDER — LACTATED RINGERS IV SOLN
INTRAVENOUS | Status: DC
  Administered 2011-08-15: 10:00:00 via INTRAVENOUS

## 2011-08-15 MED ORDER — OXYTOCIN 40 UNITS IN LACTATED RINGERS INFUSION - SIMPLE MED
62.5000 mL/h | Freq: Once | INTRAVENOUS | Status: DC
  Filled 2011-08-15: qty 1000

## 2011-08-15 MED ORDER — OXYCODONE-ACETAMINOPHEN 5-325 MG PO TABS: 1.0000 | ORAL_TABLET | ORAL | Status: DC | PRN

## 2011-08-15 MED ORDER — TETANUS-DIPHTH-ACELL PERTUSSIS 5-2.5-18.5 LF-MCG/0.5 IM SUSP
0.5000 mL | Freq: Once | INTRAMUSCULAR | Status: AC
  Administered 2011-08-17: 0.5 mL via INTRAMUSCULAR

## 2011-08-15 MED ORDER — PENICILLIN G POTASSIUM 5000000 UNITS IJ SOLR
2.5000 10*6.[IU] | INTRAVENOUS | Status: DC
  Administered 2011-08-15: 2.5 10*6.[IU] via INTRAVENOUS
  Filled 2011-08-15 (×4): qty 2.5

## 2011-08-15 MED ORDER — ONDANSETRON HCL 4 MG PO TABS: 4.0000 mg | ORAL_TABLET | ORAL | Status: DC | PRN

## 2011-08-15 MED ORDER — SIMETHICONE 80 MG PO CHEW: 80.0000 mg | CHEWABLE_TABLET | ORAL | Status: DC | PRN

## 2011-08-15 MED ORDER — SENNOSIDES-DOCUSATE SODIUM 8.6-50 MG PO TABS: 2.0000 | ORAL_TABLET | Freq: Every day | ORAL | Status: DC

## 2011-08-15 MED ORDER — WITCH HAZEL-GLYCERIN EX PADS: 1.0000 "application " | MEDICATED_PAD | CUTANEOUS | Status: DC | PRN

## 2011-08-15 MED ORDER — LIDOCAINE HCL (PF) 1 % IJ SOLN
30.0000 mL | INTRAMUSCULAR | Status: DC | PRN
  Filled 2011-08-15: qty 30

## 2011-08-15 MED ORDER — IBUPROFEN 600 MG PO TABS
600.0000 mg | ORAL_TABLET | Freq: Four times a day (QID) | ORAL | Status: DC | PRN
  Filled 2011-08-15 (×5): qty 1

## 2011-08-15 MED ORDER — ACETAMINOPHEN 325 MG PO TABS: 650.0000 mg | ORAL_TABLET | ORAL | Status: DC | PRN

## 2011-08-15 MED ORDER — LACTATED RINGERS IV SOLN: 500.0000 mL | INTRAVENOUS | Status: DC | PRN

## 2011-08-15 MED ORDER — FLEET ENEMA 7-19 GM/118ML RE ENEM: 1.0000 | ENEMA | RECTAL | Status: DC | PRN

## 2011-08-15 MED ORDER — ZOLPIDEM TARTRATE 5 MG PO TABS: 5.0000 mg | ORAL_TABLET | Freq: Every evening | ORAL | Status: DC | PRN

## 2011-08-15 MED ORDER — ONDANSETRON HCL 4 MG/2ML IJ SOLN: 4.0000 mg | INTRAMUSCULAR | Status: DC | PRN

## 2011-08-15 MED ORDER — DIBUCAINE 1 % RE OINT: 1.0000 "application " | TOPICAL_OINTMENT | RECTAL | Status: DC | PRN

## 2011-08-15 MED ORDER — BUTORPHANOL TARTRATE 2 MG/ML IJ SOLN
1.0000 mg | INTRAMUSCULAR | Status: DC | PRN
  Administered 2011-08-15: 1 mg via INTRAVENOUS
  Filled 2011-08-15: qty 1

## 2011-08-15 MED ORDER — BENZOCAINE-MENTHOL 20-0.5 % EX AERO
1.0000 "application " | INHALATION_SPRAY | CUTANEOUS | Status: DC | PRN
  Administered 2011-08-15: 1 via TOPICAL
  Filled 2011-08-15: qty 56

## 2011-08-15 MED ORDER — ONDANSETRON HCL 4 MG/2ML IJ SOLN: 4.0000 mg | Freq: Four times a day (QID) | INTRAMUSCULAR | Status: DC | PRN

## 2011-08-15 NOTE — H&P (Signed)
This is Dr. Francoise Ceo dictating the history and physical on  Monique Gamble she's a 21 year old gravida 3 para 1011 at 39+ weeks her EDC 7-13 and she desires induction her cervix is 3 cm 80% and vertex at -3 station amniotomy was performed the fluids clear she is on low-dose Pitocin should've positive GBS and was treated with penicillin Past medical history negative Past surgical history negative Social history negative System review noncontributory Physical exam well-developed female in in early labor HEENT negative Lungs clear to P&A Heart regular rhythm no murmurs no gallops Breasts engorged Abdomen term Pelvic as described above Extremities negative

## 2011-08-15 NOTE — Progress Notes (Signed)
Pt removing monitors--adjusting frequently

## 2011-08-15 NOTE — Progress Notes (Signed)
Pt refusing medication for pain-pt moving frequently r/t pain--difficult to trace at times--pt continue to remove monitors

## 2011-08-15 NOTE — Progress Notes (Signed)
Patient ID: Monique Gamble, female   DOB: 1990-06-16, 21 y.o.   MRN: [id number] I was called to room by nurse as Dr Gaynell Face was in with another delivery.  Infant was crowning and delivered with one push.  Delivery ROA with right hand by face.  Dr Gaynell Face arrived shortly after cord was clamped and cut.  Monique Heritage, DO 08/15/2011 4:34 PM

## 2011-08-16 LAB — CBC
MCH: 24.1 pg — ABNORMAL LOW (ref 26.0–34.0)
MCHC: 30.6 g/dL (ref 30.0–36.0)
RDW: 14.7 % (ref 11.5–15.5)

## 2011-08-16 NOTE — Progress Notes (Signed)
Patient ID: Monique Gamble, female   DOB: 1991/02/05, 21 y.o.   MRN: [id number] Postpartum day one Vital signs normal Fundus firm Lochia moderate Legs negative No complaints doing well

## 2011-08-16 NOTE — Progress Notes (Signed)
Patient was referred for Social Work Consult: * Referral screened out by Visual merchandiser because none of the following criteria appear to apply: ~ History of anxiety/depression during this pregnancy, or of post-partum depression. ~ Diagnosis of anxiety and/or depression within last 3 years ~ History of depression due to pregnancy loss/loss of child  Please contact the Clinical Social Worker if needs arise, or by the patient's request.  Staci Acosta, MSW LCSW 08/16/2011, 8:49 am

## 2011-08-17 NOTE — Discharge Summary (Signed)
Obstetric Discharge Summary Reason for Admission: induction of labor Prenatal Procedures: none Intrapartum Procedures: spontaneous vaginal delivery Postpartum Procedures: none Complications-Operative and Postpartum: none Hemoglobin  Date Value Range Status  08/16/2011 7.7* 12.0 - 15.0 g/dL Final     HCT  Date Value Range Status  08/16/2011 25.2* 36.0 - 46.0 % Final    Physical Exam:  General: alert Lochia: appropriate Uterine Fundus: firm Incision: healing well DVT Evaluation: No evidence of DVT seen on physical exam.  Discharge Diagnoses: Term Pregnancy-delivered  Discharge Information: Date: 08/17/2011 Activity: pelvic rest Diet: routine Medications: Percocet Condition: stable Instructions: refer to practice specific booklet Discharge to: home Follow-up Information    Follow up with Cataleah Stites A, MD. Call in 6 weeks.   Contact information:   [address] [place] [address] [phone]          Newborn Data: Live born female  Birth Weight: 6 lb 12.1 oz (3065 g) APGAR: 7, 9  Home with mother.  Monique Gamble A 08/17/2011, 6:41 AM

## 2011-08-17 NOTE — Progress Notes (Signed)
UR chart review completed.  

## 2011-11-27 ENCOUNTER — Encounter (HOSPITAL_COMMUNITY): Payer: Self-pay | Admitting: *Deleted

## 2011-11-27 ENCOUNTER — Emergency Department (HOSPITAL_COMMUNITY)
Admission: EM | Admit: 2011-11-27 | Discharge: 2011-11-28 | Disposition: A | Discharge status: Home / Self Care | Payer: Medicaid Other | Attending: Emergency Medicine | Admitting: Emergency Medicine

## 2011-11-27 DIAGNOSIS — Z8489 Family history of other specified conditions: Secondary | ICD-10-CM | POA: Insufficient documentation

## 2011-11-27 DIAGNOSIS — K612 Anorectal abscess: Secondary | ICD-10-CM | POA: Insufficient documentation

## 2011-11-27 DIAGNOSIS — Z8249 Family history of ischemic heart disease and other diseases of the circulatory system: Secondary | ICD-10-CM | POA: Insufficient documentation

## 2011-11-27 MED ORDER — LIDOCAINE HCL 1 % IJ SOLN
INTRAMUSCULAR | Status: AC
  Administered 2011-11-28
  Filled 2011-11-27: qty 40

## 2011-11-27 MED ORDER — LORAZEPAM 1 MG PO TABS
1.0000 mg | ORAL_TABLET | Freq: Once | ORAL | Status: AC
  Administered 2011-11-27: 1 mg via ORAL
  Filled 2011-11-27: qty 1

## 2011-11-27 MED ORDER — OXYCODONE-ACETAMINOPHEN 5-325 MG PO TABS
1.0000 | ORAL_TABLET | Freq: Once | ORAL | Status: AC
Start: 2011-11-27 — End: 2011-11-27
  Administered 2011-11-27: 1 via ORAL
  Filled 2011-11-27: qty 1

## 2011-11-27 NOTE — ED Provider Notes (Signed)
History     CSN: [id number]  Arrival date & time 11/27/11  2024   First MD Initiated Contact with Patient 11/27/11 2119      Chief Complaint  Patient presents with  . Recurrent Skin Infections    (Consider location/radiation/quality/duration/timing/severity/associated sxs/prior treatment) HPI Comments: 21 year old female presents emergency department complaining of right gluteal abscess.  Onset of boil began approximately 4-5 days ago.  Patient reports inability to defecate due to pain and associated symptoms including chills and fever.  Patient has been taking ibuprofen and Tylenol without any relief.  Patient has had a gluteal abscess before when pregnant, but she has never had to get drained in the OR.  There is a family history of abscesses.  Patient has no other complaints this time.  Patient denies a personal history of diabetes, immunocompromise, cancer or steroid use.  The history is provided by the patient.    Past Medical History  Diagnosis Date  . Chlamydia     Past Surgical History  Procedure Date  . Induced abortion     Family History  Problem Relation Age of Onset  . Anesthesia problems Neg Hx   . Hypertension Mother     History  Substance Use Topics  . Smoking status: Never Smoker   . Smokeless tobacco: Never Used  . Alcohol Use: No    OB History    Grav Para Term Preterm Abortions TAB SAB Ect Mult Living   3 2 2  0 1 1 0 0 0 2      Review of Systems  Constitutional: Positive for fever and chills. Negative for diaphoresis and activity change.  HENT: Negative for congestion and neck pain.   Respiratory: Negative for cough.   Genitourinary: Negative for dysuria.  Musculoskeletal: Negative for myalgias.  Skin: Positive for color change. Negative for wound.       Abscess, gluteal  Neurological: Negative for headaches.  All other systems reviewed and are negative.    Allergies  Review of patient's allergies indicates no known allergies.  Home  Medications   Current Outpatient Rx  Name Route Sig Dispense Refill  . ACETAMINOPHEN 500 MG PO TABS Oral Take 1,000 mg by mouth every 6 (six) hours as needed. For pain    . IBUPROFEN 200 MG PO TABS Oral Take 400 mg by mouth every 6 (six) hours as needed.    Marland Kitchen MEDROXYPROGESTERONE ACETATE 150 MG/ML IM SUSP Intramuscular Inject 150 mg into the muscle every 3 (three) months. Next dose due 12/2011      BP 124/60  Pulse 112  Temp 99 F (37.2 C) (Oral)  Resp 16  Ht 5\' 6"  (1.676 m)  Wt 160 lb (72.576 kg)  BMI 25.82 kg/m2  SpO2 100%  Physical Exam  Nursing note and vitals reviewed. Constitutional: She is oriented to person, place, and time. She appears well-developed and well-nourished. She does not have a sickly appearance. She does not appear ill. No distress.  HENT:  Head: Normocephalic and atraumatic.  Eyes: Conjunctivae normal and EOM are normal.  Neck: Normal range of motion. Neck supple.  Cardiovascular: Normal rate and regular rhythm.   Pulmonary/Chest: Effort normal and breath sounds normal.  Genitourinary:       Exam chaperoned.  Patient unable to tolerate digital exam do to severe pain.  Fluctuance palpated on the right rectal wall.  Musculoskeletal: She exhibits no edema.  Lymphadenopathy:       Head (right side): No submental, no preauricular and no posterior auricular  adenopathy present.       Head (left side): No submental, no submandibular, no preauricular and no posterior auricular adenopathy present.    She has no axillary adenopathy.  Neurological: She is alert and oriented to person, place, and time.  Skin: Skin is warm and dry. No rash noted. She is not diaphoretic.       7x5cm sized abscess located on right gluteal fold.  and aExtreme tenderness to palpation. Not currently draining. Abscess is fluctuant without warmth.  Surrounding erythema and induration.  Fluctuance extends into the perianal region     ED Course  Procedures (including critical care  time)   Labs Reviewed  CBC WITH DIFFERENTIAL  BASIC METABOLIC PANEL   No results found.   No diagnosis found.  Consult to general surgery; Dr. Briant Cedar to see patient in the ER for incision and drainage of abscess.  MDM  Rectal abscess  Pt I&D preformed in the ED by general surgery. Dc done by him. Follow up in his office in 2 weeks. Strict return precautions discussed.         Jaci Carrel, [place] 11/28/11 0009

## 2011-11-27 NOTE — ED Notes (Signed)
I and D kit placed at patient bedside.

## 2011-11-27 NOTE — ED Notes (Signed)
Pt arrives ambulatory to [facility]. Pt sts 4-5 days ago she noticed a boil on her right buttocks. Pt sts she has been taking ibuprofen and tylenol for pain. Fever associated with boil.

## 2011-11-28 DIAGNOSIS — K612 Anorectal abscess: Secondary | ICD-10-CM

## 2011-11-28 MED ORDER — AMOXICILLIN-POT CLAVULANATE 875-125 MG PO TABS: 1.0000 | ORAL_TABLET | Freq: Two times a day (BID) | ORAL | Status: DC

## 2011-11-28 MED ORDER — HYDROCODONE-ACETAMINOPHEN 5-325 MG PO TABS: 1.0000 | ORAL_TABLET | Freq: Four times a day (QID) | ORAL | Status: DC | PRN

## 2011-11-28 NOTE — Consult Note (Signed)
Reason for Consult:Right buttock abscess Referring Physician: Dr. Joanna Puff is an 21 y.o. female.  HPI: She describes a 2 day history of swelling in the right buttock area. She has had this before and put some cream on it and the area drained. This did not work with this specific area. She presented to the emergency department and was found to have a right buttock abscess and I was asked to see her for this. She has had a low-grade fever. She has had some constipation.  Past Medical History  Diagnosis Date  . Chlamydia     Past Surgical History  Procedure Date  . Induced abortion     Family History  Problem Relation Age of Onset  . Anesthesia problems Neg Hx   . Hypertension Mother     Social History:  reports that she has never smoked. She has never used smokeless tobacco. She reports that she uses illicit drugs (Marijuana). She reports that she does not drink alcohol.  Allergies: No Known Allergies  Prior to Admission medications   Medication Sig Start Date End Date Taking? Authorizing Provider  acetaminophen (TYLENOL) 500 MG tablet Take 1,000 mg by mouth every 6 (six) hours as needed. For pain   Yes Historical Provider, MD  ibuprofen (ADVIL,MOTRIN) 200 MG tablet Take 400 mg by mouth every 6 (six) hours as needed.   Yes Historical Provider, MD  medroxyPROGESTERone (DEPO-PROVERA) 150 MG/ML injection Inject 150 mg into the muscle every 3 (three) months. Next dose due 12/2011   Yes Historical Provider, MD  amoxicillin-clavulanate (AUGMENTIN) 875-125 MG per tablet Take 1 tablet by mouth 2 (two) times daily. 11/28/11   Lisette Paz, PA-C  HYDROcodone-acetaminophen (NORCO/VICODIN) 5-325 MG per tablet Take 1 tablet by mouth every 6 (six) hours as needed for pain. 11/28/11   Lisette Paz, PA-C    No results found for this or any previous visit (from the past 48 hour(s)).  No results found.  Review of Systems  Constitutional: Positive for fever and chills.  Respiratory:  Negative for shortness of breath.   Cardiovascular: Negative for chest pain.  Gastrointestinal: Positive for constipation.   Blood pressure 124/60, pulse 112, temperature 99 F (37.2 C), temperature source Oral, resp. rate 16, height 5\' 6"  (1.676 m), weight 160 lb (72.576 kg), SpO2 100.00%. Physical Exam  Constitutional: She appears well-developed and well-nourished. No distress.  HENT:  Head: Normocephalic and atraumatic.  Genitourinary:       Tender fluctuant area and medial aspect of right buttock with surrounding erythema.    Assessment/Plan: Right anorectal abscess  Plan: Incision and drainage. Will give her wound care instructions. Oral antibiotics and Norco for pain. Follow up in the office in 2 weeks.  Roseland Braun J 11/28/2011, 12:13 AM

## 2011-11-28 NOTE — Op Note (Signed)
Preoperative diagnosis: Right anorectal abscess  Postoperative diagnosis: Same   Procedure: Complex incision and drainage of anorectal abscess abscess  Surgeon Dr. Avel Peace  Anesthesia: Xylocaine local  Technique: After explaining the procedure and risks to her, the abscess area was sterilely prepped with Betadine and anesthetized with local anesthetic. A cruciate incision was made at the fluctuant portion of the abscess and purulent material under pressure was evacuated. The corners were then excised leaving a circular defect. Suction was used to completely evacuate the purulent fluid. A quarter inch Penrose drain was then placed into the wound. The depth of the wound was approximately 4-5 cm. The drain was sutured to the skin with 2-0 chromic suture. A bulky dressing was applied.  She tolerated the procedure well without any apparent complications. She was given wound care instructions and we will followup with her in our office in about 2 weeks. She was told to call for an appointment.

## 2011-11-28 NOTE — ED Notes (Signed)
Pt having procedure performed unable to obtain labs at this time attempts x 2

## 2011-11-28 NOTE — ED Provider Notes (Signed)
Medical screening examination/treatment/procedure(s) were conducted as a shared visit with non-physician practitioner(s) and myself.  I personally evaluated the patient during the encounter Jones Skene, M.D.  Patient had abscess on the right buttock extending down the gluteal cleft-on PA exam there is fluctuance inside the rectal canal. Will consult surgical colleagues for I&D.    Jones Skene, MD 11/28/11 4782

## 2011-12-14 ENCOUNTER — Encounter (INDEPENDENT_AMBULATORY_CARE_PROVIDER_SITE_OTHER): Payer: Medicaid Other | Admitting: General Surgery

## 2011-12-31 ENCOUNTER — Encounter (INDEPENDENT_AMBULATORY_CARE_PROVIDER_SITE_OTHER): Payer: Self-pay | Admitting: General Surgery

## 2012-01-11 ENCOUNTER — Encounter (INDEPENDENT_AMBULATORY_CARE_PROVIDER_SITE_OTHER): Payer: Self-pay | Admitting: General Surgery

## 2012-03-22 ENCOUNTER — Emergency Department (HOSPITAL_COMMUNITY)
Admission: EM | Admit: 2012-03-22 | Discharge: 2012-03-22 | Disposition: A | Discharge status: Home / Self Care | Payer: Medicaid Other | Attending: Emergency Medicine | Admitting: Emergency Medicine

## 2012-03-22 ENCOUNTER — Encounter (HOSPITAL_COMMUNITY): Payer: Self-pay | Admitting: Emergency Medicine

## 2012-03-22 DIAGNOSIS — Z79899 Other long term (current) drug therapy: Secondary | ICD-10-CM | POA: Insufficient documentation

## 2012-03-22 DIAGNOSIS — Z8619 Personal history of other infectious and parasitic diseases: Secondary | ICD-10-CM | POA: Insufficient documentation

## 2012-03-22 DIAGNOSIS — N764 Abscess of vulva: Secondary | ICD-10-CM | POA: Insufficient documentation

## 2012-03-22 MED ORDER — PERCOCET 5-325 MG PO TABS: 1.0000 | ORAL_TABLET | Freq: Four times a day (QID) | ORAL | Status: DC | PRN

## 2012-03-22 MED ORDER — OXYCODONE-ACETAMINOPHEN 5-325 MG PO TABS
1.0000 | ORAL_TABLET | Freq: Once | ORAL | Status: AC
  Administered 2012-03-22: 1 via ORAL
  Filled 2012-03-22: qty 1

## 2012-03-22 NOTE — ED Provider Notes (Signed)
History     CSN: [id number]  Arrival date & time 03/22/12  1649   First MD Initiated Contact with Patient 03/22/12 1656      Chief Complaint  Patient presents with  . Abscess    (Consider location/radiation/quality/duration/timing/severity/associated sxs/prior treatment) Patient is a 22 y.o. female presenting with abscess. The history is provided by the patient.  Abscess  This is a new problem. Episode onset: 3 days. The onset was gradual. The problem has been gradually worsening. Affected Location: left upper labia. The problem is mild. The abscess is characterized by swelling, painfulness and redness. It is unknown what she was exposed to. The abscess first occurred at home. Pertinent negatives include no anorexia, not sleeping less and no fever. Her past medical history is significant for skin abscesses in family. There were no sick contacts. She has received no recent medical care.    Past Medical History  Diagnosis Date  . Chlamydia     Past Surgical History  Procedure Date  . Induced abortion     Family History  Problem Relation Age of Onset  . Anesthesia problems Neg Hx   . Hypertension Mother     History  Substance Use Topics  . Smoking status: Never Smoker   . Smokeless tobacco: Never Used  . Alcohol Use: No    OB History    Grav Para Term Preterm Abortions TAB SAB Ect Mult Living   3 2 2  0 1 1 0 0 0 2      Review of Systems  Constitutional: Negative for fever.  Gastrointestinal: Negative for anorexia.    Allergies  Review of patient's allergies indicates no known allergies.  Home Medications   Current Outpatient Rx  Name  Route  Sig  Dispense  Refill  . ACETAMINOPHEN 500 MG PO TABS   Oral   Take 1,000 mg by mouth every 6 (six) hours as needed. For pain         . MEDROXYPROGESTERONE ACETATE 150 MG/ML IM SUSP   Intramuscular   Inject 150 mg into the muscle every 3 (three) months. Next dose due 12/2011           BP 123/74  Pulse 98   Temp 98.4 F (36.9 C) (Oral)  Resp 20  SpO2 99%  Physical Exam  Nursing note and vitals reviewed. Constitutional: She is oriented to person, place, and time. She appears well-developed and well-nourished. She does not have a sickly appearance. She does not appear ill. No distress.  HENT:  Head: Normocephalic and atraumatic.  Eyes: Conjunctivae normal and EOM are normal.  Neck: Normal range of motion. Neck supple.  Cardiovascular: Normal rate and regular rhythm.   Pulmonary/Chest: Effort normal and breath sounds normal.  Musculoskeletal: She exhibits no edema.  Lymphadenopathy:       Head (right side): No submental, no preauricular and no posterior auricular adenopathy present.       Head (left side): No submental, no submandibular, no preauricular and no posterior auricular adenopathy present.    She has no axillary adenopathy.  Neurological: She is alert and oriented to person, place, and time.  Skin: Skin is warm and dry. No rash noted. She is not diaphoretic.       2x2 cm sized abscess located on left upper labia. Extreme tenderness to palpation. Not currently draining. Abscess is fluctuant without warmth, surrounding erythema, or induration.    ED Course  Procedures (including critical care time)  Labs Reviewed - No data to  display No results found.  INCISION AND DRAINAGE Performed by: Jaci Carrel Consent: Verbal consent obtained. Risks and benefits: risks, benefits and alternatives were discussed Type: abscess  Body area: left upper labia  Anesthesia: local infiltration  Incision was made with a scalpel.  Local anesthetic: lidocaine 2% wothout epinephrine  Anesthetic total: 2 ml  Complexity: complex Blunt dissection to break up loculations  Drainage: purulent  Drainage amount: moderate  Packing material: none  Patient tolerance: Patient tolerated the procedure well with no immediate complications.    No diagnosis found.    MDM  Patient with skin  abscess amenable to incision and drainage.  Abscess was not large enough to warrant packing or drain,  wound recheck in 2 days. Encouraged home warm soaks and flushing.  Mild signs of cellulitis is surrounding skin.  Will d/c to home.  No antibiotic therapy is indicated.         Jaci Carrel, [place] 03/22/12 [phone]

## 2012-03-22 NOTE — ED Notes (Signed)
Pt with vaginal abscess x 3 days

## 2012-03-22 NOTE — ED Notes (Signed)
Pt c/o boil to vaginal area that started a few days ago, reports pain has gotten worse.

## 2012-03-24 NOTE — ED Provider Notes (Signed)
Medical screening examination/treatment/procedure(s) were performed by non-physician practitioner and as supervising physician I was immediately available for consultation/collaboration.   Joya Gaskins, MD 03/24/12 2222

## 2012-05-12 ENCOUNTER — Encounter (HOSPITAL_COMMUNITY): Payer: Self-pay | Admitting: Emergency Medicine

## 2012-05-12 ENCOUNTER — Emergency Department (HOSPITAL_COMMUNITY)
Admission: EM | Admit: 2012-05-12 | Discharge: 2012-05-12 | Disposition: A | Discharge status: Home / Self Care | Payer: Medicaid Other | Attending: Emergency Medicine | Admitting: Emergency Medicine

## 2012-05-12 DIAGNOSIS — R21 Rash and other nonspecific skin eruption: Secondary | ICD-10-CM | POA: Insufficient documentation

## 2012-05-12 DIAGNOSIS — Z8619 Personal history of other infectious and parasitic diseases: Secondary | ICD-10-CM | POA: Insufficient documentation

## 2012-05-12 DIAGNOSIS — L299 Pruritus, unspecified: Secondary | ICD-10-CM | POA: Insufficient documentation

## 2012-05-12 MED ORDER — PREDNISONE 20 MG PO TABS: ORAL_TABLET | ORAL | Status: DC

## 2012-05-12 MED ORDER — PREDNISONE 20 MG PO TABS
60.0000 mg | ORAL_TABLET | Freq: Once | ORAL | Status: AC
  Administered 2012-05-12: 60 mg via ORAL
  Filled 2012-05-12: qty 3

## 2012-05-12 NOTE — ED Provider Notes (Signed)
Medical screening examination/treatment/procedure(s) were performed by non-physician practitioner and as supervising physician I was immediately available for consultation/collaboration. Devoria Albe, MD, Armando Gang   Ward Givens, MD 05/12/12 [phone]

## 2012-05-12 NOTE — ED Notes (Signed)
Pt presents with a rash over body. Pt states "itstarted 3 days ago on my hands and it has spread all over my body" Pt report that the rash itches.

## 2012-05-12 NOTE — ED Notes (Signed)
MD at bedside. 

## 2012-05-12 NOTE — ED Provider Notes (Signed)
History    This chart was scribed for non-physician practitioner working with Ward Givens, MD by Leone Payor, ED Scribe. This patient was seen in room WTR8/WTR8 and the patient's care was started at 1748.   CSN: [id number]  Arrival date & time 05/12/12  1748   First MD Initiated Contact with Patient 05/12/12 1752      Chief Complaint  Patient presents with  . Rash     The history is provided by the patient. No language interpreter was used.    Monique Gamble is a 22 y.o. female who presents to the Emergency Department complaining of a rash that started 3 days ago. Pt states the bumps started on wrists and back of hands and spread to upper arms, lower extremities, all over body. Pt denies being bitten by a tick. Pt denies any allergies. She states the bumps are itchy. She denies fever, vaginal discharge, genital sores, eye redness, eye swelling, runny nose, sore throat. Denies new skin exposure such as new soaps or lotions. She denies past history of STD however records show she has had Chlamydia in the past. Patient was seen in emergency department 2 months ago for a labial abscess. Onset of symptoms acute. Course is constant. Nothing makes symptoms better or worse.  Pt denies smoking and alcohol use.   Past Medical History  Diagnosis Date  . Chlamydia     Past Surgical History  Procedure Laterality Date  . Induced abortion      Family History  Problem Relation Age of Onset  . Anesthesia problems Neg Hx   . Hypertension Mother     History  Substance Use Topics  . Smoking status: Never Smoker   . Smokeless tobacco: Never Used  . Alcohol Use: No    OB History   Grav Para Term Preterm Abortions TAB SAB Ect Mult Living   3 2 2  0 1 1 0 0 0 2      Review of Systems  Constitutional: Negative for fever.  HENT: Negative for congestion, sore throat, facial swelling, rhinorrhea and trouble swallowing.   Eyes: Negative for discharge, redness and itching.  Respiratory:  Negative for shortness of breath, wheezing and stridor.   Cardiovascular: Negative for chest pain.  Gastrointestinal: Negative for nausea and vomiting.  Genitourinary: Negative for vaginal discharge and genital sores.  Musculoskeletal: Negative for myalgias.  Skin: Positive for rash.  Neurological: Negative for light-headedness.  Psychiatric/Behavioral: Negative for confusion.    Allergies  Review of patient's allergies indicates no known allergies.  Home Medications   Current Outpatient Rx  Name  Route  Sig  Dispense  Refill  . predniSONE (DELTASONE) 20 MG tablet      3 Tabs PO Days 1-3, then 2 tabs PO Days 4-6, then 1 tab PO Day 7-9, then Half Tab PO Day 10-12   20 tablet   0     BP 151/91  Pulse 86  Temp(Src) 98.9 F (37.2 C) (Oral)  Resp 15  SpO2 99%  Physical Exam  Nursing note and vitals reviewed. Constitutional: She is oriented to person, place, and time. She appears well-developed and well-nourished. No distress.  HENT:  Head: Normocephalic and atraumatic.  Mouth/Throat: Oropharynx is clear and moist.  Eyes: EOM are normal. Pupils are equal, round, and reactive to light.  Conjunctiva normal.  Neck: Neck supple. No tracheal deviation present.  Cardiovascular: Normal rate.   Pulmonary/Chest: Effort normal. No respiratory distress.  Abdominal: Soft. There is no tenderness.  Musculoskeletal: Normal range of motion.  Neurological: She is alert and oriented to person, place, and time.  Skin: Skin is warm and dry. Rash noted.  Scattered papules over entire body. They spare her palms and soles. A few small macules noted on the periphery of her hands.   Psychiatric: She has a normal mood and affect. Her behavior is normal.    ED Course  Procedures (including critical care time)  DIAGNOSTIC STUDIES: Oxygen Saturation is 99% on room air, normal by my interpretation.    COORDINATION OF CARE: 6:07 PM Discussed treatment plan with pt at bedside and pt agreed to  plan.    Labs Reviewed - No data to display No results found.   1. Rash    6:33 PM Patient seen and examined. Work-up initiated. Medications ordered.   Vital signs reviewed and are as follows: Filed Vitals:   05/12/12 1825  BP: 121/80  Pulse: 92  Temp:   Resp:    Will treat his allergic reaction.  Patient's boyfriend who is present states that he was recently tested for STDs in emergency department. He did not receive a phone call regarding positive results. Patient gave me verbal permission to check his results. He was tested for syphilis, gonorrhea, Chlamydia. All of these tests were negative. Patient informed.  Patient informed that [facility] will not return and that she will receive a phone call this is positive.  Counseled on course of prednisone. Urged return if not improved in several days. Urged return sooner with worsening symptoms including airway swelling, trouble breathing, or fever. She verbalizes understanding and agrees with plan.    MDM  Patient with nonspecific skin eruption. She does not have any systemic symptoms including fever. No signs of anaphylaxis.  No mucosal involvement. Patient denies history of STDs. Rash spares palms and soles. She denies chancre. Regardless, [facility] sent to screen for syphilis. Since rash is itchy, will treat as allergic reaction with prednisone. Patient appears well, nontoxic  I personally performed the services described in this documentation, which was scribed in my presence. The recorded information has been reviewed and is accurate.       Renne Crigler, PA-C 05/12/12 1836

## 2013-07-24 ENCOUNTER — Emergency Department (HOSPITAL_COMMUNITY)
Admission: EM | Admit: 2013-07-24 | Discharge: 2013-07-25 | Disposition: A | Discharge status: Home / Self Care | Payer: Medicaid Other | Attending: Emergency Medicine | Admitting: Emergency Medicine

## 2013-07-24 ENCOUNTER — Encounter (HOSPITAL_COMMUNITY): Payer: Self-pay | Admitting: Emergency Medicine

## 2013-07-24 DIAGNOSIS — R51 Headache: Secondary | ICD-10-CM | POA: Insufficient documentation

## 2013-07-24 DIAGNOSIS — H539 Unspecified visual disturbance: Secondary | ICD-10-CM | POA: Insufficient documentation

## 2013-07-24 DIAGNOSIS — H538 Other visual disturbances: Secondary | ICD-10-CM | POA: Insufficient documentation

## 2013-07-24 DIAGNOSIS — O9989 Other specified diseases and conditions complicating pregnancy, childbirth and the puerperium: Secondary | ICD-10-CM | POA: Insufficient documentation

## 2013-07-24 DIAGNOSIS — H53149 Visual discomfort, unspecified: Secondary | ICD-10-CM | POA: Insufficient documentation

## 2013-07-24 DIAGNOSIS — Z3490 Encounter for supervision of normal pregnancy, unspecified, unspecified trimester: Secondary | ICD-10-CM

## 2013-07-24 DIAGNOSIS — Z8619 Personal history of other infectious and parasitic diseases: Secondary | ICD-10-CM | POA: Insufficient documentation

## 2013-07-24 DIAGNOSIS — R630 Anorexia: Secondary | ICD-10-CM | POA: Insufficient documentation

## 2013-07-24 DIAGNOSIS — R42 Dizziness and giddiness: Secondary | ICD-10-CM | POA: Insufficient documentation

## 2013-07-24 DIAGNOSIS — R1110 Vomiting, unspecified: Secondary | ICD-10-CM

## 2013-07-24 DIAGNOSIS — O210 Mild hyperemesis gravidarum: Secondary | ICD-10-CM | POA: Insufficient documentation

## 2013-07-24 DIAGNOSIS — R519 Headache, unspecified: Secondary | ICD-10-CM

## 2013-07-24 NOTE — ED Notes (Signed)
Patient is alert and oriented x3.  She is complaining of nausea, vomiting, blurred vision and  Lightheadedness.  Patient states that the nausea and vomiting started 3 days ago and the  Vision and lightheadedness started today.  Currently she rates her 10 of 10.

## 2013-07-25 ENCOUNTER — Emergency Department (HOSPITAL_COMMUNITY): Payer: Medicaid Other

## 2013-07-25 LAB — URINALYSIS, ROUTINE W REFLEX MICROSCOPIC
Bilirubin Urine: NEGATIVE
Glucose, UA: NEGATIVE mg/dL
Hgb urine dipstick: NEGATIVE
KETONES UR: NEGATIVE mg/dL
LEUKOCYTES UA: NEGATIVE
NITRITE: NEGATIVE
PH: 7 (ref 5.0–8.0)
Protein, ur: NEGATIVE mg/dL
Specific Gravity, Urine: 1.015 (ref 1.005–1.030)
UROBILINOGEN UA: 1 mg/dL (ref 0.0–1.0)

## 2013-07-25 LAB — PREGNANCY, URINE: Preg Test, Ur: POSITIVE — AB

## 2013-07-25 MED ORDER — SODIUM CHLORIDE 0.9 % IV BOLUS (SEPSIS)
1000.0000 mL | Freq: Once | INTRAVENOUS | Status: AC
  Administered 2013-07-25: 1000 mL via INTRAVENOUS

## 2013-07-25 MED ORDER — METOCLOPRAMIDE HCL 5 MG/ML IJ SOLN
10.0000 mg | Freq: Once | INTRAMUSCULAR | Status: AC
  Administered 2013-07-25: 10 mg via INTRAVENOUS
  Filled 2013-07-25: qty 2

## 2013-07-25 MED ORDER — ONDANSETRON 4 MG PO TBDP
4.0000 mg | ORAL_TABLET | Freq: Once | ORAL | Status: AC
  Administered 2013-07-25: 4 mg via ORAL
  Filled 2013-07-25: qty 1

## 2013-07-25 MED ORDER — DIPHENHYDRAMINE HCL 50 MG/ML IJ SOLN
50.0000 mg | Freq: Once | INTRAMUSCULAR | Status: AC
  Administered 2013-07-25: 50 mg via INTRAVENOUS
  Filled 2013-07-25: qty 1

## 2013-07-25 NOTE — ED Provider Notes (Signed)
CSN: [id number]     Arrival date & time 07/24/13  2126 History   First MD Initiated Contact with Patient 07/25/13 0021     Chief Complaint  Patient presents with  . Vomiting  . Blurred Vision     (Consider location/radiation/quality/duration/timing/severity/associated sxs/prior Treatment) HPI Comments: 23 year old female with no significant medical history, nonsmoker presents with vomiting, headache, photophobia. Symptoms have been gradually worsening for 3 days and this is more severe than her normal headaches. Mild blurry vision which has improved in ER. No head injuries, local history, aneurysm history, bleeding disorders, fevers, neck stiffness. Located frontal, non radiating.   The history is provided by the patient.    Past Medical History  Diagnosis Date  . Chlamydia    Past Surgical History  Procedure Laterality Date  . Induced abortion     Family History  Problem Relation Age of Onset  . Anesthesia problems Neg Hx   . Hypertension Mother    History  Substance Use Topics  . Smoking status: Never Smoker   . Smokeless tobacco: Never Used  . Alcohol Use: No   OB History   Grav Para Term Preterm Abortions TAB SAB Ect Mult Living   3 2 2  0 1 1 0 0 0 2     Review of Systems  Constitutional: Positive for appetite change. Negative for fever and chills.  HENT: Negative for congestion.   Eyes: Positive for photophobia and visual disturbance.  Respiratory: Negative for shortness of breath.   Cardiovascular: Negative for chest pain and leg swelling.  Gastrointestinal: Negative for vomiting and abdominal pain.  Genitourinary: Negative for dysuria and flank pain.  Musculoskeletal: Negative for back pain, neck pain and neck stiffness.  Skin: Negative for rash.  Neurological: Positive for light-headedness and headaches. Negative for syncope, weakness and numbness.      Allergies  Review of patient's allergies indicates no known allergies.  Home Medications   Prior  to Admission medications   Medication Sig Start Date End Date Taking? Authorizing Provider  acetaminophen (TYLENOL) 500 MG tablet Take 500 mg by mouth every 6 (six) hours as needed (pain).   Yes Historical Provider, MD   BP 124/72  Pulse 83  Temp(Src) 98.3 F (36.8 C) (Oral)  Resp 18  SpO2 100%  LMP 06/07/2013 Physical Exam  Nursing note and vitals reviewed. Constitutional: She is oriented to person, place, and time. She appears well-developed and well-nourished.  HENT:  Head: Normocephalic and atraumatic.  Eyes: Conjunctivae are normal. Right eye exhibits no discharge. Left eye exhibits no discharge.  Neck: Normal range of motion. Neck supple. No tracheal deviation present.  Cardiovascular: Normal rate and regular rhythm.   Pulmonary/Chest: Effort normal and breath sounds normal.  Abdominal: Soft. She exhibits no distension. There is no tenderness. There is no guarding.  Musculoskeletal: She exhibits no edema.  Neurological: She is alert and oriented to person, place, and time. GCS eye subscore is 4. GCS verbal subscore is 5. GCS motor subscore is 6.  5+ strength in UE and LE with f/e at major joints. Sensation to palpation intact in UE and LE. CNs 2-12 grossly intact.  EOMFI.  PERRL.   Finger nose and coordination intact bilateral.   Visual fields intact to finger testing. No papilledema  Skin: Skin is warm. No rash noted.  Psychiatric: She has a normal mood and affect.    ED Course  Procedures (including critical care time) EMERGENCY DEPARTMENT [place] PREGNANCY "Study: Limited Ultrasound of the Pelvis for Pregnancy"  INDICATIONS:Pregnancy(required) Multiple views of the uterus and pelvic cavity were obtained in real-time with a multi-frequency probe.  APPROACH:Transabdominal   PERFORMED BY: Myself  IMAGES ARCHIVED?: Yes  LIMITATIONS: Decompressed bladder  PREGNANCY FREE FLUID: None  PREGNANCY FINDINGS: Intrauterine gestational sac noted, Fetal pole present and Fetal  heart activity seen  INTERPRETATION: Viable intrauterine pregnancy  GESTATIONAL AGE, ESTIMATE: 6wk 4 d  FETAL HEART RATE: nl     Labs Review Labs Reviewed  PREGNANCY, URINE - Abnormal; Notable for the following:    Preg Test, Ur POSITIVE (*)    All other components within normal limits  URINALYSIS, ROUTINE W REFLEX MICROSCOPIC    Imaging Review Ct Head Wo Contrast  07/25/2013   CLINICAL DATA:  Blurred vision and lightheadedness.  EXAM: CT HEAD WITHOUT CONTRAST  TECHNIQUE: Contiguous axial images were obtained from the base of the skull through the vertex without contrast.  COMPARISON:  04/13/2011.  FINDINGS: Normal appearance of the intracranial structures. No evidence for acute hemorrhage, mass lesion, midline shift, hydrocephalus or large infarct. No acute bony abnormality. The visualized sinuses are clear.  IMPRESSION: No acute intracranial abnormality.  Stable appearance from priors.   Electronically Signed   By: Davonna BellingJohn  Curnes M.D.   On: 07/25/2013 01:39     EKG Interpretation None      MDM   Final diagnoses:  Pregnancy  Vomiting  Headache   Well-appearing female with clinically migraine/other type of non-emergent headache.  Patient improved significantly in ER and is asymptomatic on recheck. Urinalysis shows new pregnancy to discuss with patient she is having no abdominal pain no vaginal bleeding. Patient has OB to followup outpatient. Bedside ultrasound showed 6 week 4 days normal fetal heart rate. CT head reviewed unremarkable. No signs of meningitis.  Results and differential diagnosis were discussed with the patient/parent/guardian. Close follow up outpatient was discussed, comfortable with the plan.   Medications  ondansetron (ZOFRAN-ODT) disintegrating tablet 4 mg (4 mg Oral Given 07/25/13 0107)  sodium chloride 0.9 % bolus 1,000 mL (0 mLs Intravenous Stopped 07/25/13 0212)  diphenhydrAMINE (BENADRYL) injection 50 mg (50 mg Intravenous Given 07/25/13 0107)   metoCLOPramide (REGLAN) injection 10 mg (10 mg Intravenous Given 07/25/13 0107)    Filed Vitals:   07/24/13 2257 07/25/13 0206  BP: 125/77 124/72  Pulse: 72 83  Temp: 98.5 F (36.9 C) 98.3 F (36.8 C)  TempSrc:  Oral  Resp: 18 18  SpO2: 100% 100%         Enid SkeensJoshua M Zacory Fiola, MD 07/25/13 [phone]

## 2013-07-25 NOTE — Discharge Instructions (Signed)
If you were given medicines take as directed.  If you are on coumadin or contraceptives realize their levels and effectiveness is altered by many different medicines.  If you have any reaction (rash, tongues swelling, other) to the medicines stop taking and see a physician.   Please follow up as directed and return to the ER or see a physician for new or worsening symptoms.  Thank you. Filed Vitals:   07/24/13 2257 07/25/13 0206  BP: 125/77 124/72  Pulse: 72 83  Temp: 98.5 F (36.9 C) 98.3 F (36.8 C)  TempSrc:  Oral  Resp: 18 18  SpO2: 100% 100%

## 2013-08-14 ENCOUNTER — Emergency Department (HOSPITAL_COMMUNITY)
Admission: EM | Admit: 2013-08-14 | Discharge: 2013-08-14 | Disposition: A | Discharge status: Home / Self Care | Payer: Medicaid Other | Attending: Emergency Medicine | Admitting: Emergency Medicine

## 2013-08-14 ENCOUNTER — Encounter (HOSPITAL_COMMUNITY): Payer: Self-pay | Admitting: Emergency Medicine

## 2013-08-14 DIAGNOSIS — N390 Urinary tract infection, site not specified: Secondary | ICD-10-CM | POA: Insufficient documentation

## 2013-08-14 DIAGNOSIS — R109 Unspecified abdominal pain: Secondary | ICD-10-CM | POA: Insufficient documentation

## 2013-08-14 DIAGNOSIS — R51 Headache: Secondary | ICD-10-CM | POA: Insufficient documentation

## 2013-08-14 DIAGNOSIS — O2390 Unspecified genitourinary tract infection in pregnancy, unspecified trimester: Secondary | ICD-10-CM | POA: Insufficient documentation

## 2013-08-14 DIAGNOSIS — O2341 Unspecified infection of urinary tract in pregnancy, first trimester: Secondary | ICD-10-CM

## 2013-08-14 DIAGNOSIS — O9989 Other specified diseases and conditions complicating pregnancy, childbirth and the puerperium: Secondary | ICD-10-CM | POA: Insufficient documentation

## 2013-08-14 DIAGNOSIS — Z8619 Personal history of other infectious and parasitic diseases: Secondary | ICD-10-CM | POA: Insufficient documentation

## 2013-08-14 LAB — URINALYSIS, ROUTINE W REFLEX MICROSCOPIC
GLUCOSE, UA: NEGATIVE mg/dL
HGB URINE DIPSTICK: NEGATIVE
KETONES UR: NEGATIVE mg/dL
Nitrite: POSITIVE — AB
PROTEIN: 30 mg/dL — AB
Specific Gravity, Urine: 1.025 (ref 1.005–1.030)
UROBILINOGEN UA: 2 mg/dL — AB (ref 0.0–1.0)
pH: 6.5 (ref 5.0–8.0)

## 2013-08-14 LAB — PREGNANCY, URINE: PREG TEST UR: POSITIVE — AB

## 2013-08-14 LAB — URINE MICROSCOPIC-ADD ON

## 2013-08-14 MED ORDER — SODIUM CHLORIDE 0.9 % IV BOLUS (SEPSIS)
1000.0000 mL | Freq: Once | INTRAVENOUS | Status: AC
  Administered 2013-08-14: 1000 mL via INTRAVENOUS

## 2013-08-14 MED ORDER — ACETAMINOPHEN 325 MG PO TABS
650.0000 mg | ORAL_TABLET | Freq: Once | ORAL | Status: AC
  Administered 2013-08-14: 650 mg via ORAL
  Filled 2013-08-14: qty 2

## 2013-08-14 MED ORDER — DEXTROSE 5 % IV SOLN
1.0000 g | Freq: Once | INTRAVENOUS | Status: AC
  Administered 2013-08-14: 1 g via INTRAVENOUS
  Filled 2013-08-14: qty 10

## 2013-08-14 MED ORDER — CEPHALEXIN 500 MG PO CAPS: 500.0000 mg | ORAL_CAPSULE | Freq: Four times a day (QID) | ORAL | Status: DC

## 2013-08-14 MED ORDER — ONDANSETRON HCL 4 MG PO TABS: 4.0000 mg | ORAL_TABLET | Freq: Four times a day (QID) | ORAL | Status: DC

## 2013-08-14 NOTE — ED Notes (Signed)
Per pt, started having headache 4-5 hours ago.  Pt is pregnant.  No hx of migraines.  Vitals:  136/74, hr 100, resp 18, cbg 129

## 2013-08-14 NOTE — ED Notes (Signed)
Pt states started having headache last night.  No n/v.  No fever.  Pt also states left lower side pain.  States no bleeding or discharge. No difficulty urinating.

## 2013-08-14 NOTE — ED Provider Notes (Signed)
CSN: [id number]     Arrival date & time 08/14/13  1528 History   First MD Initiated Contact with Patient 08/14/13 1716     Chief Complaint  Patient presents with  . Headache  . Flank Pain     (Consider location/radiation/quality/duration/timing/severity/associated sxs/prior Treatment) HPI  23 year old pregnant female (LMP 4/22) presents with headache and dysuria.  Pt report for the past 1 week she has been experiencing burning while urinate with urinary frequency and urgency.  She also has intermittent sharp pain to the L side of her abd that lasted for a few seconds and resolved.  Since last night she experienced sharp throbbing pain to R temporal region, gradual onset around 9pm last night while watching TV and now it has becoming progressively worse.  Light bothers her, and she endorse mild nausea and blurry vision.  Denies fever, chills, neck stiffness, cp, sob, productive cough, rash, recent trauma, vaginal bleeding, vaginal discharge.  Pt is G3P2.  No specific treatment tried.    Past Medical History  Diagnosis Date  . Chlamydia    Past Surgical History  Procedure Laterality Date  . Induced abortion     Family History  Problem Relation Age of Onset  . Anesthesia problems Neg Hx   . Hypertension Mother    History  Substance Use Topics  . Smoking status: Never Smoker   . Smokeless tobacco: Never Used  . Alcohol Use: No   OB History   Grav Para Term Preterm Abortions TAB SAB Ect Mult Living   4 2 2  0 1 1 0 0 0 2     Review of Systems  All other systems reviewed and are negative.     Allergies  Review of patient's allergies indicates no known allergies.  Home Medications   Prior to Admission medications   Medication Sig Start Date End Date Taking? Authorizing Alejo Beamer  acetaminophen (TYLENOL) 500 MG tablet Take 500 mg by mouth every 6 (six) hours as needed for moderate pain.    Yes Historical Iyari Hagner, MD   BP 116/78  Pulse 102  Temp(Src) 97.9 F (36.6 C)  (Oral)  Resp 16  SpO2 100%  LMP 06/07/2013 Physical Exam  Nursing note and vitals reviewed. Constitutional: She appears well-developed and well-nourished. No distress.  HENT:  Head: Atraumatic.  Eyes: Conjunctivae are normal.  Neck: Normal range of motion. Neck supple.  No nuchal rigidity  Cardiovascular: Normal rate and regular rhythm.   Pulmonary/Chest: Effort normal and breath sounds normal.  Abdominal: Soft. There is tenderness (gravid abdomen with mild L lateral abdominal discomfort without guarding or rebound tenderness.  ).  Neurological: She is alert.  Skin: No rash noted.  Psychiatric: She has a normal mood and affect.    ED Course  Procedures (including critical care time)  5:34 PM Pt here with dysuria concerning for UTI.  Headache without meningismal sign concerning for meningitis.  No Htn concerning for preeclampsia.  Tylenol for headache, IVF because pt feels dehydrated with nausea.    6:22 PM UA result consistent with UTI.  Doubt STD given no c/o vaginal discharge or other risky sexual behavior.  Rocephin given in ER.  Will d/c with keflex.  Pt request for antiemetic.  I discussed risk of taking antiemetic in first trimester pregnancy such as Zofran however pt acknowledge the risk, sts she took it in past pregnancy and it has helped without adverse effect.  Pt fully acknowledge the risk and request medication.  Care discussed with attending.  Will prescribe zofran and keflex.  Recommend return if sxs not improve.  Pt able to tolerates PO here. I also offer pelvic exam to r/o STD, pt declined stating she has no sxs to suggest it, and sts she has STD in the past without similar sxs this time.  Pt is scheduled to f/u with OBGYN next week.  She has not had a formal US but a bedside US was performed by Dr. Jodi MourningZavitz over a week ago with evidence of IUP.  Labs Review Labs Reviewed  URINALYSIS, ROUTINE W REFLEX MICROSCOPIC - Abnormal; Notable for the following:    Color, Urine  AMBER (*)    APPearance TURBID (*)    Bilirubin Urine SMALL (*)    Protein, ur 30 (*)    Urobilinogen, UA 2.0 (*)    Nitrite POSITIVE (*)    Leukocytes, UA SMALL (*)    All other components within normal limits  PREGNANCY, URINE - Abnormal; Notable for the following:    Preg Test, Ur POSITIVE (*)    All other components within normal limits  URINE MICROSCOPIC-ADD ON - Abnormal; Notable for the following:    Squamous Epithelial / LPF MANY (*)    Bacteria, UA MANY (*)    All other components within normal limits    Imaging Review No results found.   EKG Interpretation None      MDM   Final diagnoses:  UTI in pregnancy, first trimester    BP 130/73  Pulse 99  Temp(Src) 98.2 F (36.8 C) (Oral)  Resp 18  SpO2 100%  LMP 06/07/2013      Fayrene HelperBowie Tran, PA-C 08/14/13 1950

## 2013-08-14 NOTE — Discharge Instructions (Signed)
Please take antibiotic for the full duration as treatment of your urinary tract infection.  Take antinausea medication only as needed, but please acknowledge the possible risk the medication may present for your fetus.  Return if your symptoms worsen.  Follow up with your OBGYN as previously scheduled for a full evaluation and for a formal ultrasound.  Pregnancy and Urinary Tract Infection A urinary tract infection (UTI) is a bacterial infection of the urinary tract. Infection of the urinary tract can include the ureters, kidneys (pyelonephritis), bladder (cystitis), and urethra (urethritis). All pregnant women should be screened for bacteria in the urinary tract. Identifying and treating a UTI will decrease the risk of preterm labor and developing more serious infections in both the mother and baby. CAUSES Bacteria germs cause almost all UTIs.  RISK FACTORS Many factors can increase your chances of getting a UTI during pregnancy. These include:  Having a short urethra.  Poor toilet and hygiene habits.  Sexual intercourse.  Blockage of urine along the urinary tract.  Problems with the pelvic muscles or nerves.  Diabetes.  Obesity.  Bladder problems after having several children.  Previous history of UTI. SIGNS AND SYMPTOMS   Pain, burning, or a stinging feeling when urinating.  Suddenly feeling the need to urinate right away (urgency).  Loss of bladder control (urinary incontinence).  Frequent urination, more than is common with pregnancy.  Lower abdominal or back discomfort.  Cloudy urine.  Blood in the urine (hematuria).  Fever. When the kidneys are infected, the symptoms may be:  Back pain.  Flank pain on the right side more so than the left.  Fever.  Chills.  Nausea.  Vomiting. DIAGNOSIS  A urinary tract infection is usually diagnosed through urine tests. Additional tests and procedures are sometimes done. These may include:  Ultrasound exam of the  kidneys, ureters, bladder, and urethra.  Looking in the bladder with a lighted tube (cystoscopy). TREATMENT Typically, UTIs can be treated with antibiotic medicines.  HOME CARE INSTRUCTIONS   Only take over-the-counter or prescription medicines as directed by your health care provider. If you were prescribed antibiotics, take them as directed. Finish them even if you start to feel better.  Drink enough fluids to keep your urine clear or pale yellow.  Do not have sexual intercourse until the infection is gone and your health care provider says it is okay.  Make sure you are tested for UTIs throughout your pregnancy. These infections often come back. Preventing a UTI in the Future  Practice good toilet habits. Always wipe from front to back. Use the tissue only once.  Do not hold your urine. Empty your bladder as soon as possible when the urge comes.  Do not douche or use deodorant sprays.  Wash with soap and warm water around the genital area and the anus.  Empty your bladder before and after sexual intercourse.  Wear underwear with a cotton crotch.  Avoid caffeine and carbonated drinks. They can irritate the bladder.  Drink cranberry juice or take cranberry pills. This may decrease the risk of getting a UTI.  Do not drink alcohol.  Keep all your appointments and tests as scheduled. SEEK MEDICAL CARE IF:   Your symptoms get worse.  You are still having fevers 2 or more days after treatment begins.  You have a rash.  You feel that you are having problems with medicines prescribed.  You have abnormal vaginal discharge. SEEK IMMEDIATE MEDICAL CARE IF:   You have back or flank pain.  You have chills.  You have blood in your urine.  You have nausea and vomiting.  You have contractions of your uterus.  You have a gush of fluid from the vagina. MAKE SURE YOU:  Understand these instructions.   Will watch your condition.   Will get help right away if you are  not doing well or get worse.  Document Released: 05/30/2010 Document Revised: 11/23/2012 Document Reviewed: 09/01/2012 [facility] Patient Information 2015 [place], [place]. This information is not intended to replace advice given to you by your health care provider. Make sure you discuss any questions you have with your health care provider.

## 2013-08-15 NOTE — ED Provider Notes (Signed)
Medical screening examination/treatment/procedure(s) were conducted as a shared visit with non-physician practitioner(s) and myself.  I personally evaluated the patient during the encounter.   EKG Interpretation None      I interviewed and examined the patient. Lungs are CTAB. Cardiac exam wnl. Abdomen soft, gravid. Pt feeling better after tx. BP normal. Will tx UTI and rec f/u w/ obgyn.   Junius ArgyleForrest S Harrison, MD 08/15/13 1017

## 2013-09-26 ENCOUNTER — Inpatient Hospital Stay (HOSPITAL_COMMUNITY)
Admission: AD | Admit: 2013-09-26 | Discharge: 2013-09-26 | Disposition: A | Discharge status: Home / Self Care | Payer: Medicaid Other | Source: Ambulatory Visit | Attending: Obstetrics & Gynecology | Admitting: Obstetrics & Gynecology

## 2013-09-26 ENCOUNTER — Encounter (HOSPITAL_COMMUNITY): Payer: Self-pay | Admitting: *Deleted

## 2013-09-26 DIAGNOSIS — N10 Acute tubulo-interstitial nephritis: Secondary | ICD-10-CM

## 2013-09-26 DIAGNOSIS — N12 Tubulo-interstitial nephritis, not specified as acute or chronic: Secondary | ICD-10-CM | POA: Diagnosis not present

## 2013-09-26 DIAGNOSIS — O2390 Unspecified genitourinary tract infection in pregnancy, unspecified trimester: Secondary | ICD-10-CM | POA: Insufficient documentation

## 2013-09-26 DIAGNOSIS — R52 Pain, unspecified: Secondary | ICD-10-CM | POA: Insufficient documentation

## 2013-09-26 LAB — URINALYSIS, ROUTINE W REFLEX MICROSCOPIC
Bilirubin Urine: NEGATIVE
GLUCOSE, UA: NEGATIVE mg/dL
Ketones, ur: >80 mg/dL — AB
NITRITE: POSITIVE — AB
PH: 6 (ref 5.0–8.0)
Protein, ur: 30 mg/dL — AB
SPECIFIC GRAVITY, URINE: 1.02 (ref 1.005–1.030)
Urobilinogen, UA: >8 mg/dL — ABNORMAL HIGH (ref 0.0–1.0)

## 2013-09-26 LAB — URINE MICROSCOPIC-ADD ON

## 2013-09-26 LAB — CBC WITH DIFFERENTIAL/PLATELET
BASOS PCT: 0 % (ref 0–1)
Basophils Absolute: 0 10*3/uL (ref 0.0–0.1)
EOS ABS: 0 10*3/uL (ref 0.0–0.7)
EOS PCT: 0 % (ref 0–5)
HCT: 33 % — ABNORMAL LOW (ref 36.0–46.0)
Hemoglobin: 11 g/dL — ABNORMAL LOW (ref 12.0–15.0)
LYMPHS ABS: 1.7 10*3/uL (ref 0.7–4.0)
Lymphocytes Relative: 12 % (ref 12–46)
MCH: 28.1 pg (ref 26.0–34.0)
MCHC: 33.3 g/dL (ref 30.0–36.0)
MCV: 84.2 fL (ref 78.0–100.0)
Monocytes Absolute: 1.6 10*3/uL — ABNORMAL HIGH (ref 0.1–1.0)
Monocytes Relative: 11 % (ref 3–12)
NEUTROS PCT: 77 % (ref 43–77)
Neutro Abs: 11.4 10*3/uL — ABNORMAL HIGH (ref 1.7–7.7)
PLATELETS: 183 10*3/uL (ref 150–400)
RBC: 3.92 MIL/uL (ref 3.87–5.11)
RDW: 14 % (ref 11.5–15.5)
WBC: 14.7 10*3/uL — ABNORMAL HIGH (ref 4.0–10.5)

## 2013-09-26 MED ORDER — AMOXICILLIN-POT CLAVULANATE 875-125 MG PO TABS: 1.0000 | ORAL_TABLET | Freq: Two times a day (BID) | ORAL | Status: DC

## 2013-09-26 MED ORDER — CEFTRIAXONE SODIUM 1 G IJ SOLR
1.0000 g | Freq: Once | INTRAMUSCULAR | Status: AC
  Administered 2013-09-26: 1 g via INTRAMUSCULAR
  Filled 2013-09-26: qty 10

## 2013-09-26 MED ORDER — CEFTRIAXONE SODIUM 1 G IJ SOLR: 2.0000 g | Freq: Once | INTRAMUSCULAR | Status: DC

## 2013-09-26 NOTE — MAU Provider Note (Signed)
History     CSN: [id number]  Arrival date and time: 09/26/13 2002   First Provider Initiated Contact with Patient 09/26/13 2058      Chief Complaint  Patient presents with  . Dysuria  . Generalized Body Aches   HPI [name] is a 23 y.o. [phone] at 3019w0d who presents to MAU today with complaint of flank pain worse on the left. The patient states subjective fever yesterday, but none today. She states Tylenol taken last night and around 1800 today. She states flank pain x 2 days. She denies UTI symptoms, but had told RN she had dysuria. She denies vaginal bleeding, hematuria, abdominal pain, N/V/D today. She states last BM was ~ 3 days ago, however this is "normal for her."   OB History   Grav Para Term Preterm Abortions TAB SAB Ect Mult Living   4 2 2  0 1 1 0 0 0 2      Past Medical History  Diagnosis Date  . Chlamydia     Past Surgical History  Procedure Laterality Date  . Induced abortion      Family History  Problem Relation Age of Onset  . Anesthesia problems Neg Hx   . Hypertension Mother     History  Substance Use Topics  . Smoking status: Never Smoker   . Smokeless tobacco: Never Used  . Alcohol Use: No    Allergies: No Known Allergies  No prescriptions prior to admission    Review of Systems  Constitutional: Positive for fever. Negative for chills and malaise/fatigue.  Gastrointestinal: Positive for constipation. Negative for nausea, vomiting, abdominal pain and diarrhea.  Genitourinary: Positive for flank pain. Negative for dysuria, urgency, frequency and hematuria.       Neg - vaginal bleeding   Physical Exam   Blood pressure 122/65, pulse 88, temperature 98.4 F (36.9 C), temperature source Oral, resp. rate 16, height 5\' 6"  (1.676 m), weight 150 lb (68.04 kg), last menstrual period 06/07/2013, SpO2 100.00%.  Physical Exam  Vitals reviewed. Constitutional: She is oriented to person, place, and time. She appears well-developed and  well-nourished. No distress.  HENT:  Head: Normocephalic and atraumatic.  Cardiovascular: Regular rhythm and normal heart sounds.  Tachycardia present.   Respiratory: Breath sounds normal. No respiratory distress.  GI: Soft. Bowel sounds are normal. She exhibits no distension and no mass. There is no tenderness. There is CVA tenderness (mild, more on the left). There is no rebound and no guarding.  Neurological: She is alert and oriented to person, place, and time.  Skin: Skin is warm and dry. No erythema.  Psychiatric: She has a normal mood and affect.   Results for orders placed during the hospital encounter of 09/26/13 (from the past 24 hour(s))  URINALYSIS, ROUTINE W REFLEX MICROSCOPIC     Status: Abnormal   Collection Time    09/26/13  8:20 PM      Result Value Ref Range   Color, Urine ORANGE (*) YELLOW   APPearance CLEAR  CLEAR   Specific Gravity, Urine 1.020  1.005 - 1.030   pH 6.0  5.0 - 8.0   Glucose, UA NEGATIVE  NEGATIVE mg/dL   Hgb urine dipstick TRACE (*) NEGATIVE   Bilirubin Urine NEGATIVE  NEGATIVE   Ketones, ur >80 (*) NEGATIVE mg/dL   Protein, ur 30 (*) NEGATIVE mg/dL   Urobilinogen, UA >1.4>8.0 (*) 0.0 - 1.0 mg/dL   Nitrite POSITIVE (*) NEGATIVE   Leukocytes, UA MODERATE (*) NEGATIVE  URINE MICROSCOPIC-ADD ON     Status: Abnormal   Collection Time    09/26/13  8:20 PM      Result Value Ref Range   Squamous Epithelial / LPF FEW (*) RARE   WBC, UA 21-50  <3 WBC/hpf   RBC / HPF 7-10  <3 RBC/hpf   Bacteria, UA MANY (*) RARE   Urine-Other MUCOUS PRESENT    CBC WITH DIFFERENTIAL     Status: Abnormal   Collection Time    09/26/13 10:18 PM      Result Value Ref Range   WBC 14.7 (*) 4.0 - 10.5 K/uL   RBC 3.92  3.87 - 5.11 MIL/uL   Hemoglobin 11.0 (*) 12.0 - 15.0 g/dL   HCT 16.1 (*) 09.6 - 04.5 %   MCV 84.2  78.0 - 100.0 fL   MCH 28.1  26.0 - 34.0 pg   MCHC 33.3  30.0 - 36.0 g/dL   RDW 40.9  81.1 - 91.4 %   Platelets 183  150 - 400 K/uL   Neutrophils Relative % 77   43 - 77 %   Neutro Abs 11.4 (*) 1.7 - 7.7 K/uL   Lymphocytes Relative 12  12 - 46 %   Lymphs Abs 1.7  0.7 - 4.0 K/uL   Monocytes Relative 11  3 - 12 %   Monocytes Absolute 1.6 (*) 0.1 - 1.0 K/uL   Eosinophils Relative 0  0 - 5 %   Eosinophils Absolute 0.0  0.0 - 0.7 K/uL   Basophils Relative 0  0 - 1 %   Basophils Absolute 0.0  0.0 - 0.1 K/uL    MAU Course  Procedures None  MDM FHR - 156 bpm with doppler UA and CBC today Discussed with Dr. Erin Fulling. 1 G Rocephin in MAU and 10 days Augmentin Rx. Follow-up if symptoms persist or worsen.   Assessment and Plan  A: SIUP at [redacted]w[redacted]d Pyelonephritis  P: Discharge home Rx for Augmentin given to patient Patient advised to continue Tylenol PRN pain Discussed warning signs for worsening pyelonephritis Patient may return to MAU as needed or if her condition were to change or worsen  Freddi Starr, PA-C  09/27/2013, 4:35 AM

## 2013-09-26 NOTE — MAU Note (Signed)
Pt reports body aches, hurts to urinate.

## 2013-09-26 NOTE — MAU Note (Signed)
Pt states she has been having pain in her back and left side, Pt states she has just been having body aches. Pt states she doesn't feel well and "I think I am dehydrated"

## 2013-09-26 NOTE — Discharge Instructions (Signed)
Pregnancy and Urinary Tract Infection °A urinary tract infection (UTI) is a bacterial infection of the urinary tract. Infection of the urinary tract can include the ureters, kidneys (pyelonephritis), bladder (cystitis), and urethra (urethritis). All pregnant women should be screened for bacteria in the urinary tract. Identifying and treating a UTI will decrease the risk of preterm labor and developing more serious infections in both the mother and baby. °CAUSES °Bacteria germs cause almost all UTIs.  °RISK FACTORS °Many factors can increase your chances of getting a UTI during pregnancy. These include: °· Having a short urethra. °· Poor toilet and hygiene habits. °· Sexual intercourse. °· Blockage of urine along the urinary tract. °· Problems with the pelvic muscles or nerves. °· Diabetes. °· Obesity. °· Bladder problems after having several children. °· Previous history of UTI. °SIGNS AND SYMPTOMS  °· Pain, burning, or a stinging feeling when urinating. °· Suddenly feeling the need to urinate right away (urgency). °· Loss of bladder control (urinary incontinence). °· Frequent urination, more than is common with pregnancy. °· Lower abdominal or back discomfort. °· Cloudy urine. °· Blood in the urine (hematuria). °· Fever.  °When the kidneys are infected, the symptoms may be: °· Back pain. °· Flank pain on the right side more so than the left. °· Fever. °· Chills. °· Nausea. °· Vomiting. °DIAGNOSIS  °A urinary tract infection is usually diagnosed through urine tests. Additional tests and procedures are sometimes done. These may include: °· Ultrasound exam of the kidneys, ureters, bladder, and urethra. °· Looking in the bladder with a lighted tube (cystoscopy). °TREATMENT °Typically, UTIs can be treated with antibiotic medicines.  °HOME CARE INSTRUCTIONS  °· Only take over-the-counter or prescription medicines as directed by your health care provider. If you were prescribed antibiotics, take them as directed. Finish  them even if you start to feel better. °· Drink enough fluids to keep your urine clear or pale yellow. °· Do not have sexual intercourse until the infection is gone and your health care provider says it is okay. °· Make sure you are tested for UTIs throughout your pregnancy. These infections often come back.  °Preventing a UTI in the Future °· Practice good toilet habits. Always wipe from front to back. Use the tissue only once. °· Do not hold your urine. Empty your bladder as soon as possible when the urge comes. °· Do not douche or use deodorant sprays. °· Wash with soap and warm water around the genital area and the anus. °· Empty your bladder before and after sexual intercourse. °· Wear underwear with a cotton crotch. °· Avoid caffeine and carbonated drinks. They can irritate the bladder. °· Drink cranberry juice or take cranberry pills. This may decrease the risk of getting a UTI. °· Do not drink alcohol. °· Keep all your appointments and tests as scheduled.  °SEEK MEDICAL CARE IF:  °· Your symptoms get worse. °· You are still having fevers 2 or more days after treatment begins. °· You have a rash. °· You feel that you are having problems with medicines prescribed. °· You have abnormal vaginal discharge. °SEEK IMMEDIATE MEDICAL CARE IF:  °· You have back or flank pain. °· You have chills. °· You have blood in your urine. °· You have nausea and vomiting. °· You have contractions of your uterus. °· You have a gush of fluid from the vagina. °MAKE SURE YOU: °· Understand these instructions.   °· Will watch your condition.   °· Will get help right away if you are not doing   well or get worse.   °Document Released: 05/30/2010 Document Revised: 11/23/2012 Document Reviewed: 09/01/2012 °ExitCare® Patient Information ©2015 [employer], [employer]. This information is not intended to replace advice given to you by your health care provider. Make sure you discuss any questions you have with your health care provider. ° °

## 2013-09-27 NOTE — MAU Provider Note (Signed)
Attestation of Attending Supervision of Advanced Practitioner (CNM/NP): Evaluation and management procedures were performed by the Advanced Practitioner under my supervision and collaboration.  I have reviewed the Advanced Practitioner's note and chart, and I agree with the management and plan.   Will attempt the outpatient treatment of cystitis vs early pyelonephritis.  If she fails outpatient will need inpt abtx.   HARRAWAY-SMITH, Latania Bascomb 4:47 AM

## 2013-09-29 ENCOUNTER — Encounter (HOSPITAL_COMMUNITY): Payer: Self-pay | Admitting: *Deleted

## 2013-09-29 ENCOUNTER — Inpatient Hospital Stay (HOSPITAL_COMMUNITY)
Admission: AD | Admit: 2013-09-29 | Discharge: 2013-09-29 | Disposition: A | Discharge status: Home / Self Care | Payer: Medicaid Other | Source: Ambulatory Visit | Attending: Obstetrics and Gynecology | Admitting: Obstetrics and Gynecology

## 2013-09-29 DIAGNOSIS — R51 Headache: Secondary | ICD-10-CM | POA: Insufficient documentation

## 2013-09-29 DIAGNOSIS — O219 Vomiting of pregnancy, unspecified: Secondary | ICD-10-CM

## 2013-09-29 DIAGNOSIS — O9989 Other specified diseases and conditions complicating pregnancy, childbirth and the puerperium: Principal | ICD-10-CM

## 2013-09-29 DIAGNOSIS — O26892 Other specified pregnancy related conditions, second trimester: Secondary | ICD-10-CM

## 2013-09-29 DIAGNOSIS — O99891 Other specified diseases and conditions complicating pregnancy: Secondary | ICD-10-CM | POA: Insufficient documentation

## 2013-09-29 DIAGNOSIS — O210 Mild hyperemesis gravidarum: Secondary | ICD-10-CM

## 2013-09-29 LAB — URINALYSIS, ROUTINE W REFLEX MICROSCOPIC
GLUCOSE, UA: NEGATIVE mg/dL
HGB URINE DIPSTICK: NEGATIVE
KETONES UR: 15 mg/dL — AB
Leukocytes, UA: NEGATIVE
Nitrite: NEGATIVE
PROTEIN: NEGATIVE mg/dL
Specific Gravity, Urine: 1.01 (ref 1.005–1.030)
pH: 6.5 (ref 5.0–8.0)

## 2013-09-29 LAB — CBC WITH DIFFERENTIAL/PLATELET
BASOS ABS: 0 10*3/uL (ref 0.0–0.1)
BASOS PCT: 0 % (ref 0–1)
EOS ABS: 0.1 10*3/uL (ref 0.0–0.7)
EOS PCT: 1 % (ref 0–5)
HEMATOCRIT: 32.3 % — AB (ref 36.0–46.0)
Hemoglobin: 10.7 g/dL — ABNORMAL LOW (ref 12.0–15.0)
Lymphocytes Relative: 27 % (ref 12–46)
Lymphs Abs: 2.1 10*3/uL (ref 0.7–4.0)
MCH: 27.9 pg (ref 26.0–34.0)
MCHC: 33.1 g/dL (ref 30.0–36.0)
MCV: 84.1 fL (ref 78.0–100.0)
MONO ABS: 0.8 10*3/uL (ref 0.1–1.0)
Monocytes Relative: 11 % (ref 3–12)
Neutro Abs: 4.7 10*3/uL (ref 1.7–7.7)
Neutrophils Relative %: 61 % (ref 43–77)
PLATELETS: 227 10*3/uL (ref 150–400)
RBC: 3.84 MIL/uL — ABNORMAL LOW (ref 3.87–5.11)
RDW: 13.6 % (ref 11.5–15.5)
WBC: 7.7 10*3/uL (ref 4.0–10.5)

## 2013-09-29 MED ORDER — PROMETHAZINE HCL 25 MG PO TABS: 12.5000 mg | ORAL_TABLET | Freq: Four times a day (QID) | ORAL | Status: DC | PRN

## 2013-09-29 MED ORDER — ACETAMINOPHEN 325 MG PO TABS
650.0000 mg | ORAL_TABLET | Freq: Once | ORAL | Status: AC
  Administered 2013-09-29: 650 mg via ORAL
  Filled 2013-09-29: qty 2

## 2013-09-29 MED ORDER — ONDANSETRON 8 MG PO TBDP
8.0000 mg | ORAL_TABLET | Freq: Once | ORAL | Status: AC
  Administered 2013-09-29: 8 mg via ORAL
  Filled 2013-09-29: qty 1

## 2013-09-29 NOTE — MAU Provider Note (Signed)
History     CSN: [id number]  Arrival date and time: 09/29/13 1746   None     Chief Complaint  Patient presents with  . Headache   HPI Monique Gamble is 23 y.o. [phone] [redacted]w[redacted]d weeks presenting with headache , "left side of my head" and nausea/vomiting that began 2 days ago with beginning antibiotic for UTI.  She has vomited 4 X today.  She has not eaten anything today because everything comes back up.   She thinks she is dehydrated. She has not tried anything for her headache. She was seen here 8/12 with abnormal UA.  She was given Rocephin in MAU and home with Augmentin that she has been unable to keep down.   She states she had [facility] scan at [facility] for same type of headache, same date they told her she was pregnancy.  She states CT of her head was normal.     Past Medical History  Diagnosis Date  . Chlamydia     Past Surgical History  Procedure Laterality Date  . Induced abortion      Family History  Problem Relation Age of Onset  . Anesthesia problems Neg Hx   . Hypertension Mother     History  Substance Use Topics  . Smoking status: Never Smoker   . Smokeless tobacco: Never Used  . Alcohol Use: No    Allergies: No Known Allergies  Prescriptions prior to admission  Medication Sig Dispense Refill  . acetaminophen (TYLENOL) 500 MG tablet Take 500 mg by mouth every 6 (six) hours as needed for moderate pain.       Marland Kitchen amoxicillin-clavulanate (AUGMENTIN) 875-125 MG per tablet Take 1 tablet by mouth every 12 (twelve) hours.  20 tablet  0    Review of Systems  Constitutional: Negative for fever and chills.  Gastrointestinal: Positive for nausea and vomiting.  Genitourinary:       Neg for vaginal bleeding or discharge  Neurological: Positive for headaches.   Physical Exam   Blood pressure 118/71, pulse 101, temperature 98.7 F (37.1 C), temperature source Oral, resp. rate 18, height 5' 3.5" (1.613 m), weight 149 lb (67.586 kg), last menstrual period  06/07/2013.  Physical Exam  Constitutional: She is oriented to person, place, and time. She appears well-developed and well-nourished. No distress.  HENT:  Head: Normocephalic.  Neck: Normal range of motion.  Respiratory: Effort normal.  Neurological: She is alert and oriented to person, place, and time. No cranial nerve deficit. She exhibits normal muscle tone. Coordination normal.   Results for orders placed during the hospital encounter of 09/29/13 (from the past 24 hour(s))  URINALYSIS, ROUTINE W REFLEX MICROSCOPIC     Status: Abnormal   Collection Time    09/29/13  6:05 PM      Result Value Ref Range   Color, Urine YELLOW  YELLOW   APPearance HAZY (*) CLEAR   Specific Gravity, Urine 1.010  1.005 - 1.030   pH 6.5  5.0 - 8.0   Glucose, UA NEGATIVE  NEGATIVE mg/dL   Hgb urine dipstick NEGATIVE  NEGATIVE   Bilirubin Urine SMALL (*) NEGATIVE   Ketones, ur 15 (*) NEGATIVE mg/dL   Protein, ur NEGATIVE  NEGATIVE mg/dL   Urobilinogen, UA >1.4 (*) 0.0 - 1.0 mg/dL   Nitrite NEGATIVE  NEGATIVE   Leukocytes, UA NEGATIVE  NEGATIVE   MAU Course  Procedures  Tylenol 650mg  and Zofran given in MAU.  MDM Discussed with patient that nausea, vomiting and no  food can cause headache.  Because her SG is 1.010, Tylenol and Zofran ordered and will get her dinner and see how she does.   She is not actively vomiting.  Her UA is much improved from last visit. Care turned over to H. Mathews RobinsonsHogan, CNM at 8:10 2051: patient is tolerating PO food and fluids. Headache is better. Will stop Augmentin at this time.  Assessment and Plan   1. Nausea and vomiting during pregnancy prior to [redacted] weeks gestation   2. Headache in pregnancy, antepartum, second trimester      Medication List    STOP taking these medications       amoxicillin-clavulanate 875-125 MG per tablet  Commonly known as:  AUGMENTIN      TAKE these medications       acetaminophen 500 MG tablet  Commonly known as:  TYLENOL  Take 500 mg by  mouth every 6 (six) hours as needed for moderate pain.     promethazine 25 MG tablet  Commonly known as:  PHENERGAN  Take 0.5-1 tablets (12.5-25 mg total) by mouth every 6 (six) hours as needed.       Follow-up Information   [facility] [place] (As scheduled)    Contact information:   [address] [place] [id number] [phone]       Matt HolmesKEY,EVE M 09/29/2013, 8:08 PM

## 2013-09-29 NOTE — MAU Note (Signed)
Severe pain in head feels like been punched in the head. Started 2-3 days ago.  Feeling a little dizzy.  On rx for UTI, but not keeping it down. Thinks she is getting dehydrated.

## 2013-09-29 NOTE — MAU Provider Note (Signed)
Attestation of Attending Supervision of Advanced Practitioner: Evaluation and management procedures were performed by the PA/NP/CNM/OB Fellow under my supervision/collaboration. Chart reviewed and agree with management and plan.  Vandora Jaskulski V 09/29/2013 10:42 PM

## 2013-09-29 NOTE — Discharge Instructions (Signed)
Second Trimester of Pregnancy The second trimester is from week 13 through week 28, months 4 through 6. The second trimester is often a time when you feel your best. Your body has also adjusted to being pregnant, and you begin to feel better physically. Usually, morning sickness has lessened or quit completely, you may have more energy, and you may have an increase in appetite. The second trimester is also a time when the fetus is growing rapidly. At the end of the sixth month, the fetus is about 9 inches long and weighs about 1 pounds. You will likely begin to feel the baby move (quickening) between 18 and 20 weeks of the pregnancy. BODY CHANGES Your body goes through many changes during pregnancy. The changes vary from woman to woman.   Your weight will continue to increase. You will notice your lower abdomen bulging out.  You may begin to get stretch marks on your hips, abdomen, and breasts.  You may develop headaches that can be relieved by medicines approved by your health care provider.  You may urinate more often because the fetus is pressing on your bladder.  You may develop or continue to have heartburn as a result of your pregnancy.  You may develop constipation because certain hormones are causing the muscles that push waste through your intestines to slow down.  You may develop hemorrhoids or swollen, bulging veins (varicose veins).  You may have back pain because of the weight gain and pregnancy hormones relaxing your joints between the bones in your pelvis and as a result of a shift in weight and the muscles that support your balance.  Your breasts will continue to grow and be tender.  Your gums may bleed and may be sensitive to brushing and flossing.  Dark spots or blotches (chloasma, mask of pregnancy) may develop on your face. This will likely fade after the baby is born.  A dark line from your belly button to the pubic area (linea nigra) may appear. This will likely fade  after the baby is born.  You may have changes in your hair. These can include thickening of your hair, rapid growth, and changes in texture. Some women also have hair loss during or after pregnancy, or hair that feels dry or thin. Your hair will most likely return to normal after your baby is born. WHAT TO EXPECT AT YOUR PRENATAL VISITS During a routine prenatal visit:  You will be weighed to make sure you and the fetus are growing normally.  Your blood pressure will be taken.  Your abdomen will be measured to track your baby's growth.  The fetal heartbeat will be listened to.  Any test results from the previous visit will be discussed. Your health care provider may ask you:  How you are feeling.  If you are feeling the baby move.  If you have had any abnormal symptoms, such as leaking fluid, bleeding, severe headaches, or abdominal cramping.  If you have any questions. Other tests that may be performed during your second trimester include:  Blood tests that check for:  Low iron levels (anemia).  Gestational diabetes (between 24 and 28 weeks).  Rh antibodies.  Urine tests to check for infections, diabetes, or protein in the urine.  An ultrasound to confirm the proper growth and development of the baby.  An amniocentesis to check for possible genetic problems.  Fetal screens for spina bifida and Down syndrome. HOME CARE INSTRUCTIONS   Avoid all smoking, herbs, alcohol, and unprescribed   drugs. These chemicals affect the formation and growth of the baby.  Follow your health care provider's instructions regarding medicine use. There are medicines that are either safe or unsafe to take during pregnancy.  Exercise only as directed by your health care provider. Experiencing uterine cramps is a good sign to stop exercising.  Continue to eat regular, healthy meals.  Wear a good support bra for breast tenderness.  Do not use hot tubs, steam rooms, or saunas.  Wear your  seat belt at all times when driving.  Avoid raw meat, uncooked cheese, cat litter boxes, and soil used by cats. These carry germs that can cause birth defects in the baby.  Take your prenatal vitamins.  Try taking a stool softener (if your health care provider approves) if you develop constipation. Eat more high-fiber foods, such as fresh vegetables or fruit and whole grains. Drink plenty of fluids to keep your urine clear or pale yellow.  Take warm sitz baths to soothe any pain or discomfort caused by hemorrhoids. Use hemorrhoid cream if your health care provider approves.  If you develop varicose veins, wear support hose. Elevate your feet for 15 minutes, 3-4 times a day. Limit salt in your diet.  Avoid heavy lifting, wear low heel shoes, and practice good posture.  Rest with your legs elevated if you have leg cramps or low back pain.  Visit your dentist if you have not gone yet during your pregnancy. Use a soft toothbrush to brush your teeth and be gentle when you floss.  A sexual relationship may be continued unless your health care provider directs you otherwise.  Continue to go to all your prenatal visits as directed by your health care provider. SEEK MEDICAL CARE IF:   You have dizziness.  You have mild pelvic cramps, pelvic pressure, or nagging pain in the abdominal area.  You have persistent nausea, vomiting, or diarrhea.  You have a bad smelling vaginal discharge.  You have pain with urination. SEEK IMMEDIATE MEDICAL CARE IF:   You have a fever.  You are leaking fluid from your vagina.  You have spotting or bleeding from your vagina.  You have severe abdominal cramping or pain.  You have rapid weight gain or loss.  You have shortness of breath with chest pain.  You notice sudden or extreme swelling of your face, hands, ankles, feet, or legs.  You have not felt your baby move in over an hour.  You have severe headaches that do not go away with  medicine.  You have vision changes. Document Released: 01/27/2001 Document Revised: 02/07/2013 Document Reviewed: 04/05/2012 Exit[employer] Patient Information 2015 [employer]. This information is not intended to replace advice given to you by your health care provider. Make sure you discuss any questions you have with your health care provider.  

## 2013-09-29 NOTE — MAU Note (Signed)
PT SAYS SHE STARTED HAVING PAIN IN  HEAD  ON LEFT  - ABOVE EYE.-  STARTED  WED-  NO MEDS TODAY    SAYS SHE WAS HERE  ON Tuesday- FOR UTI-  GAVE AUGMENTIN-  AND INJECTION.   HAS BEEN VOMITING AUGMENTIN        NO VOMITING OTHERWISE.       [facility]    LAST SEX-  09-16-2013   .

## 2013-11-03 ENCOUNTER — Inpatient Hospital Stay (HOSPITAL_COMMUNITY)
Admission: AD | Admit: 2013-11-03 | Discharge: 2013-11-03 | Disposition: A | Discharge status: Home / Self Care | Payer: Medicaid Other | Source: Ambulatory Visit | Attending: Obstetrics and Gynecology | Admitting: Obstetrics and Gynecology

## 2013-11-03 DIAGNOSIS — O9989 Other specified diseases and conditions complicating pregnancy, childbirth and the puerperium: Principal | ICD-10-CM

## 2013-11-03 DIAGNOSIS — N949 Unspecified condition associated with female genital organs and menstrual cycle: Secondary | ICD-10-CM | POA: Insufficient documentation

## 2013-11-03 DIAGNOSIS — R109 Unspecified abdominal pain: Secondary | ICD-10-CM | POA: Diagnosis present

## 2013-11-03 DIAGNOSIS — O99891 Other specified diseases and conditions complicating pregnancy: Secondary | ICD-10-CM | POA: Diagnosis not present

## 2013-11-03 LAB — URINALYSIS, ROUTINE W REFLEX MICROSCOPIC
BILIRUBIN URINE: NEGATIVE
Glucose, UA: NEGATIVE mg/dL
Hgb urine dipstick: NEGATIVE
KETONES UR: 15 mg/dL — AB
NITRITE: NEGATIVE
PROTEIN: NEGATIVE mg/dL
Specific Gravity, Urine: 1.02 (ref 1.005–1.030)
UROBILINOGEN UA: 1 mg/dL (ref 0.0–1.0)
pH: 6 (ref 5.0–8.0)

## 2013-11-03 LAB — URINE MICROSCOPIC-ADD ON

## 2013-11-03 MED ORDER — ACETAMINOPHEN 325 MG PO TABS: 650.0000 mg | ORAL_TABLET | Freq: Four times a day (QID) | ORAL | Status: DC | PRN

## 2013-11-03 NOTE — Discharge Instructions (Signed)
Second Trimester of Pregnancy The second trimester is from week 13 through week 28, months 4 through 6. The second trimester is often a time when you feel your best. Your body has also adjusted to being pregnant, and you begin to feel better physically. Usually, morning sickness has lessened or quit completely, you may have more energy, and you may have an increase in appetite. The second trimester is also a time when the fetus is growing rapidly. At the end of the sixth month, the fetus is about 9 inches long and weighs about 1 pounds. You will likely begin to feel the baby move (quickening) between 18 and 20 weeks of the pregnancy. BODY CHANGES Your body goes through many changes during pregnancy. The changes vary from woman to woman.   Your weight will continue to increase. You will notice your lower abdomen bulging out.  You may begin to get stretch marks on your hips, abdomen, and breasts.  You may develop headaches that can be relieved by medicines approved by your health care provider.  You may urinate more often because the fetus is pressing on your bladder.  You may develop or continue to have heartburn as a result of your pregnancy.  You may develop constipation because certain hormones are causing the muscles that push waste through your intestines to slow down.  You may develop hemorrhoids or swollen, bulging veins (varicose veins).  You may have back pain because of the weight gain and pregnancy hormones relaxing your joints between the bones in your pelvis and as a result of a shift in weight and the muscles that support your balance.  Your breasts will continue to grow and be tender.  Your gums may bleed and may be sensitive to brushing and flossing.  Dark spots or blotches (chloasma, mask of pregnancy) may develop on your face. This will likely fade after the baby is born.  A dark line from your belly button to the pubic area (linea nigra) may appear. This will likely fade  after the baby is born.  You may have changes in your hair. These can include thickening of your hair, rapid growth, and changes in texture. Some women also have hair loss during or after pregnancy, or hair that feels dry or thin. Your hair will most likely return to normal after your baby is born. WHAT TO EXPECT AT YOUR PRENATAL VISITS During a routine prenatal visit:  You will be weighed to make sure you and the fetus are growing normally.  Your blood pressure will be taken.  Your abdomen will be measured to track your baby's growth.  The fetal heartbeat will be listened to.  Any test results from the previous visit will be discussed. Your health care provider may ask you:  How you are feeling.  If you are feeling the baby move.  If you have had any abnormal symptoms, such as leaking fluid, bleeding, severe headaches, or abdominal cramping.  If you have any questions. Other tests that may be performed during your second trimester include:  Blood tests that check for:  Low iron levels (anemia).  Gestational diabetes (between 24 and 28 weeks).  Rh antibodies.  Urine tests to check for infections, diabetes, or protein in the urine.  An ultrasound to confirm the proper growth and development of the baby.  An amniocentesis to check for possible genetic problems.  Fetal screens for spina bifida and Down syndrome. HOME CARE INSTRUCTIONS   Avoid all smoking, herbs, alcohol, and unprescribed   drugs. These chemicals affect the formation and growth of the baby.  Follow your health care provider's instructions regarding medicine use. There are medicines that are either safe or unsafe to take during pregnancy.  Exercise only as directed by your health care provider. Experiencing uterine cramps is a good sign to stop exercising.  Continue to eat regular, healthy meals.  Wear a good support bra for breast tenderness.  Do not use hot tubs, steam rooms, or saunas.  Wear your  seat belt at all times when driving.  Avoid raw meat, uncooked cheese, cat litter boxes, and soil used by cats. These carry germs that can cause birth defects in the baby.  Take your prenatal vitamins.  Try taking a stool softener (if your health care provider approves) if you develop constipation. Eat more high-fiber foods, such as fresh vegetables or fruit and whole grains. Drink plenty of fluids to keep your urine clear or pale yellow.  Take warm sitz baths to soothe any pain or discomfort caused by hemorrhoids. Use hemorrhoid cream if your health care provider approves.  If you develop varicose veins, wear support hose. Elevate your feet for 15 minutes, 3-4 times a day. Limit salt in your diet.  Avoid heavy lifting, wear low heel shoes, and practice good posture.  Rest with your legs elevated if you have leg cramps or low back pain.  Visit your dentist if you have not gone yet during your pregnancy. Use a soft toothbrush to brush your teeth and be gentle when you floss.  A sexual relationship may be continued unless your health care provider directs you otherwise.  Continue to go to all your prenatal visits as directed by your health care provider. SEEK MEDICAL CARE IF:   You have dizziness.  You have mild pelvic cramps, pelvic pressure, or nagging pain in the abdominal area.  You have persistent nausea, vomiting, or diarrhea.  You have a bad smelling vaginal discharge.  You have pain with urination. SEEK IMMEDIATE MEDICAL CARE IF:   You have a fever.  You are leaking fluid from your vagina.  You have spotting or bleeding from your vagina.  You have severe abdominal cramping or pain.  You have rapid weight gain or loss.  You have shortness of breath with chest pain.  You notice sudden or extreme swelling of your face, hands, ankles, feet, or legs.  You have not felt your baby move in over an hour.  You have severe headaches that do not go away with  medicine.  You have vision changes. Document Released: 01/27/2001 Document Revised: 02/07/2013 Document Reviewed: 04/05/2012 Exit[employer] Patient Information 2015 [employer]. This information is not intended to replace advice given to you by your health care provider. Make sure you discuss any questions you have with your health care provider.  

## 2013-11-03 NOTE — MAU Note (Signed)
Patient states she has had abdominal cramping for 2 days. Denies bleeding or leaking fluid. Patient has had no prenatal care and plans to go to Dr. Gaynell Face when she gets her Medicaid.

## 2013-11-03 NOTE — MAU Provider Note (Signed)
  History     CSN: [id number]  Arrival date and time: 11/03/13 4098   None     Chief Complaint  Patient presents with  . Abdominal Cramping   HPI Monique Gamble is a 23yo [id number] @ 21.2wks by LMP presenting for eval of occ abd cramping. Denies bldg, fever, or dysuria. Reports H/A but has not tried anything for it yet. She plans to see Dr Gaynell Face for prenatal care once her MCD is established.  OB History   Grav Para Term Preterm Abortions TAB SAB Ect Mult Living   0 1 1 0 0 0 2      Past Medical History  Diagnosis Date  . Chlamydia     Past Surgical History  Procedure Laterality Date  . Induced abortion      Family History  Problem Relation Age of Onset  . Anesthesia problems Neg Hx   . Hypertension Mother     History  Substance Use Topics  . Smoking status: Never Smoker   . Smokeless tobacco: Never Used  . Alcohol Use: No    Allergies: No Known Allergies  Prescriptions prior to admission  Medication Sig Dispense Refill  . acetaminophen (TYLENOL) 500 MG tablet Take 500 mg by mouth every 6 (six) hours as needed for moderate pain.       . Pediatric Multiple Vit-C-FA (FLINSTONES GUMMIES OMEGA-3 DHA) CHEW Chew 2 tablets by mouth daily.        ROS Physical Exam   Blood pressure 125/72, pulse 107, temperature 98.9 F (37.2 C), resp. rate 16, height  (1.626 m), weight 74.39 kg (164 lb), last menstrual period 06/07/2013, SpO2 99.00%.  Physical Exam  Constitutional: She is oriented to person, place, and time. She appears well-developed.  HENT:  Head: Normocephalic.  Neck: Normal range of motion.  Cardiovascular:  Sl tachycardia  Respiratory: Effort normal.  GI:  Soft, gravid; FHTs 145  Genitourinary: Vagina normal.  Cx C/L  Musculoskeletal: Normal range of motion.  Neurological: She is alert and oriented to person, place, and time.  Skin: Skin is warm and dry.  Psychiatric: She has a normal mood and affect. Her behavior is normal. Thought content  normal.   Urinalysis    Component Value Date/Time   COLORURINE YELLOW 11/03/2013 1920   APPEARANCEUR CLEAR 11/03/2013 1920   LABSPEC 1.020 11/03/2013 1920   PHURINE 6.0 11/03/2013 1920   GLUCOSEU NEGATIVE 11/03/2013 1920   HGBUR NEGATIVE 11/03/2013 1920   BILIRUBINUR NEGATIVE 11/03/2013 1920   KETONESUR 15* 11/03/2013 1920   PROTEINUR NEGATIVE 11/03/2013 1920   UROBILINOGEN 1.0 11/03/2013 1920   NITRITE NEGATIVE 11/03/2013 1920   LEUKOCYTESUR TRACE* 11/03/2013 1920   Micro: many SE, few bact  MAU Course  Procedures    Assessment and Plan  IUP @ 21.2wks Round lig pain  D/C home with preterm labor precautions Outpt U/S for anatomy/dating ordered F/U with Dr Elsie Stain office to establish [facility] as soon as possible  Cam Hai CNM 11/03/2013, 9:31 PM

## 2013-11-05 NOTE — MAU Provider Note (Signed)
Attestation of Attending Supervision of Advanced Practitioner (CNM/NP): Evaluation and management procedures were performed by the Advanced Practitioner under my supervision and collaboration.  I have reviewed the Advanced Practitioner's note and chart, and I agree with the management and plan.  Claudius Mich 11/05/2013 9:49 AM

## 2013-11-10 ENCOUNTER — Ambulatory Visit (HOSPITAL_COMMUNITY)
Admission: RE | Admit: 2013-11-10 | Discharge: 2013-11-10 | Disposition: A | Discharge status: Home / Self Care | Payer: Medicaid Other | Source: Ambulatory Visit | Attending: Advanced Practice Midwife | Admitting: Advanced Practice Midwife

## 2013-11-10 DIAGNOSIS — Z363 Encounter for antenatal screening for malformations: Secondary | ICD-10-CM | POA: Insufficient documentation

## 2013-11-10 DIAGNOSIS — N949 Unspecified condition associated with female genital organs and menstrual cycle: Secondary | ICD-10-CM | POA: Diagnosis not present

## 2013-11-10 DIAGNOSIS — Z1389 Encounter for screening for other disorder: Secondary | ICD-10-CM | POA: Diagnosis not present

## 2013-11-10 DIAGNOSIS — O99891 Other specified diseases and conditions complicating pregnancy: Secondary | ICD-10-CM | POA: Diagnosis present

## 2013-11-10 DIAGNOSIS — O9989 Other specified diseases and conditions complicating pregnancy, childbirth and the puerperium: Principal | ICD-10-CM

## 2013-12-13 ENCOUNTER — Encounter (HOSPITAL_COMMUNITY): Payer: Self-pay | Admitting: *Deleted

## 2013-12-13 ENCOUNTER — Inpatient Hospital Stay (HOSPITAL_COMMUNITY)
Admission: AD | Admit: 2013-12-13 | Discharge: 2013-12-13 | Disposition: A | Discharge status: Home / Self Care | Payer: Medicaid Other | Source: Ambulatory Visit | Attending: Obstetrics & Gynecology | Admitting: Obstetrics & Gynecology

## 2013-12-13 DIAGNOSIS — Z3A27 27 weeks gestation of pregnancy: Secondary | ICD-10-CM | POA: Diagnosis not present

## 2013-12-13 DIAGNOSIS — O4702 False labor before 37 completed weeks of gestation, second trimester: Secondary | ICD-10-CM | POA: Diagnosis not present

## 2013-12-13 DIAGNOSIS — O479 False labor, unspecified: Secondary | ICD-10-CM

## 2013-12-13 HISTORY — DX: Headache, unspecified: R51.9

## 2013-12-13 HISTORY — DX: Headache: R51

## 2013-12-13 LAB — FETAL FIBRONECTIN: Fetal Fibronectin: NEGATIVE

## 2013-12-13 LAB — URINALYSIS, ROUTINE W REFLEX MICROSCOPIC
BILIRUBIN URINE: NEGATIVE
Glucose, UA: NEGATIVE mg/dL
HGB URINE DIPSTICK: NEGATIVE
KETONES UR: NEGATIVE mg/dL
Leukocytes, UA: NEGATIVE
NITRITE: NEGATIVE
PROTEIN: NEGATIVE mg/dL
Specific Gravity, Urine: 1.015 (ref 1.005–1.030)
UROBILINOGEN UA: 1 mg/dL (ref 0.0–1.0)
pH: 7 (ref 5.0–8.0)

## 2013-12-13 NOTE — MAU Provider Note (Signed)
  History     CSN: [id number]  Arrival date and time: 12/13/13 1648   None     Chief Complaint  Patient presents with  . Contractions   HPI  Patient is 23 y.o. [id number] 1437w1d here with complaints of contractions since Sunday, has been in MAU for 2 hours and thinks she may have had 2 contractions.  +FM, denies LOF, VB, vaginal discharge.     Past Medical History  Diagnosis Date  . Chlamydia   . Headache     Past Surgical History  Procedure Laterality Date  . Induced abortion      Family History  Problem Relation Age of Onset  . Anesthesia problems Neg Hx   . Hypertension Mother     History  Substance Use Topics  . Smoking status: Never Smoker   . Smokeless tobacco: Never Used  . Alcohol Use: No    Allergies: No Known Allergies  Prescriptions prior to admission  Medication Sig Dispense Refill  . Pediatric Multiple Vit-C-FA (FLINSTONES GUMMIES OMEGA-3 DHA) CHEW Chew 2 tablets by mouth daily.        Review of Systems  Constitutional: Positive for diaphoresis. Negative for fever and chills.  Respiratory: Negative for cough and shortness of breath.   Cardiovascular: Negative for chest pain and leg swelling.  Gastrointestinal: Positive for heartburn. Negative for nausea, vomiting and diarrhea.  Genitourinary: Negative for dysuria, urgency, frequency and hematuria.  Neurological:       No headache   Physical Exam   Blood pressure 122/65, pulse 115, temperature 99 F (37.2 C), temperature source Oral, resp. rate 18, height 5\' 5"  (1.651 m), weight 176 lb (79.833 kg), last menstrual period 06/07/2013.  Physical Exam  Constitutional: She is oriented to person, place, and time. She appears well-developed and well-nourished.  HENT:  Head: Normocephalic and atraumatic.  Eyes: Conjunctivae and EOM are normal.  Neck: Normal range of motion.  Cardiovascular: Normal rate, regular rhythm and normal heart sounds.   Respiratory: Effort normal. No respiratory  distress.  GI: Soft. Bowel sounds are normal. She exhibits no distension. There is no tenderness.  Musculoskeletal: Normal range of motion. She exhibits no edema.  Neurological: She is alert and oriented to person, place, and time.  Skin: Skin is warm and dry. No erythema.  Dilation: Closed Exam by:: Dr. Weyman RodneyAccosta   MAU Course  Procedures  MDM NST: reactive, no contractions  Assessment and Plan  Patient is 23 y.o. [id number] 6872w2d reporting contractions however no contractions currently - fetal kick counts reinforced - preterm labor precautions   Madoc Holquin ROCIO 12/13/2013, 6:50 PM

## 2013-12-13 NOTE — Discharge Instructions (Signed)
Braxton Hicks Contractions °Contractions of the uterus can occur throughout pregnancy. Contractions are not always a sign that you are in labor.  °WHAT ARE BRAXTON HICKS CONTRACTIONS?  °Contractions that occur before labor are called Braxton Hicks contractions, or false labor. Toward the end of pregnancy (32-34 weeks), these contractions can develop more often and may become more forceful. This is not true labor because these contractions do not result in opening (dilatation) and thinning of the cervix. They are sometimes difficult to tell apart from true labor because these contractions can be forceful and people have different pain tolerances. You should not feel embarrassed if you go to the hospital with false labor. Sometimes, the only way to tell if you are in true labor is for your health care provider to look for changes in the cervix. °If there are no prenatal problems or other health problems associated with the pregnancy, it is completely safe to be sent home with false labor and await the onset of true labor. °HOW CAN YOU TELL THE DIFFERENCE BETWEEN TRUE AND FALSE LABOR? °False Labor °· The contractions of false labor are usually shorter and not as hard as those of true labor.   °· The contractions are usually irregular.   °· The contractions are often felt in the front of the lower abdomen and in the groin.   °· The contractions may go away when you walk around or change positions while lying down.   °· The contractions get weaker and are shorter lasting as time goes on.   °· The contractions do not usually become progressively stronger, regular, and closer together as with true labor.   °True Labor °· Contractions in true labor last 30-70 seconds, become very regular, usually become more intense, and increase in frequency.   °· The contractions do not go away with walking.   °· The discomfort is usually felt in the top of the uterus and spreads to the lower abdomen and low back.   °· True labor can be  determined by your health care provider with an exam. This will show that the cervix is dilating and getting thinner.   °WHAT TO REMEMBER °· Keep up with your usual exercises and follow other instructions given by your health care provider.   °· Take medicines as directed by your health care provider.   °· Keep your regular prenatal appointments.   °· Eat and drink lightly if you think you are going into labor.   °· If Braxton Hicks contractions are making you uncomfortable:   °¨ Change your position from lying down or resting to walking, or from walking to resting.   °¨ Sit and rest in a tub of warm water.   °¨ Drink 2-3 glasses of water. Dehydration may cause these contractions.   °¨ Do slow and deep breathing several times an hour.   °WHEN SHOULD I SEEK IMMEDIATE MEDICAL CARE? °Seek immediate medical care if: °· Your contractions become stronger, more regular, and closer together.   °· You have fluid leaking or gushing from your vagina.   °· You have a fever.   °· You pass blood-tinged mucus.   °· You have vaginal bleeding.   °· You have continuous abdominal pain.   °· You have low back pain that you never had before.   °· You feel your baby's head pushing down and causing pelvic pressure.   °· Your baby is not moving as much as it used to.   °Document Released: 02/02/2005 Document Revised: 02/07/2013 Document Reviewed: 11/14/2012 °[employer]® Patient Information ©2015 [employer]. This information is not intended to replace advice given to you by your health care   provider. Make sure you discuss any questions you have with your health care provider. ° °

## 2013-12-13 NOTE — MAU Note (Signed)
Contractions since Sunday.  Come and they go; every 5-1310min, maybe 20. No bleeding, no leaking..  No hx of PTL

## 2013-12-18 ENCOUNTER — Encounter (HOSPITAL_COMMUNITY): Payer: Self-pay | Admitting: *Deleted

## 2014-01-29 ENCOUNTER — Inpatient Hospital Stay (HOSPITAL_COMMUNITY)
Admission: AD | Admit: 2014-01-29 | Discharge: 2014-01-29 | Disposition: A | Discharge status: Home / Self Care | Payer: Medicaid Other | Source: Ambulatory Visit | Attending: Family Medicine | Admitting: Family Medicine

## 2014-01-29 DIAGNOSIS — E860 Dehydration: Secondary | ICD-10-CM | POA: Diagnosis not present

## 2014-01-29 DIAGNOSIS — Z3A32 32 weeks gestation of pregnancy: Secondary | ICD-10-CM | POA: Diagnosis not present

## 2014-01-29 DIAGNOSIS — O212 Late vomiting of pregnancy: Secondary | ICD-10-CM | POA: Insufficient documentation

## 2014-01-29 LAB — COMPREHENSIVE METABOLIC PANEL WITH GFR
ALT: <5 U/L (ref 0–35)
AST: 13 U/L (ref 0–37)
Albumin: 2.4 g/dL — ABNORMAL LOW (ref 3.5–5.2)
Alkaline Phosphatase: 120 U/L — ABNORMAL HIGH (ref 39–117)
Anion gap: 12 (ref 5–15)
BUN: 3 mg/dL — ABNORMAL LOW (ref 6–23)
CO2: 22 meq/L (ref 19–32)
Calcium: 8.5 mg/dL (ref 8.4–10.5)
Chloride: 104 meq/L (ref 96–112)
Creatinine, Ser: 0.55 mg/dL (ref 0.50–1.10)
GFR calc Af Amer: >90 mL/min
GFR calc non Af Amer: >90 mL/min
Glucose, Bld: 79 mg/dL (ref 70–99)
Potassium: 4.1 meq/L (ref 3.7–5.3)
Sodium: 138 meq/L (ref 137–147)
Total Bilirubin: 0.8 mg/dL (ref 0.3–1.2)
Total Protein: 6.6 g/dL (ref 6.0–8.3)

## 2014-01-29 LAB — URINE MICROSCOPIC-ADD ON

## 2014-01-29 LAB — URINALYSIS, ROUTINE W REFLEX MICROSCOPIC
BILIRUBIN URINE: NEGATIVE
GLUCOSE, UA: NEGATIVE mg/dL
HGB URINE DIPSTICK: NEGATIVE
KETONES UR: 15 mg/dL — AB
Nitrite: NEGATIVE
PROTEIN: 30 mg/dL — AB
Specific Gravity, Urine: 1.02 (ref 1.005–1.030)
UROBILINOGEN UA: 4 mg/dL — AB (ref 0.0–1.0)
pH: 7.5 (ref 5.0–8.0)

## 2014-01-29 MED ORDER — ONDANSETRON 4 MG PO TBDP
4.0000 mg | ORAL_TABLET | Freq: Once | ORAL | Status: AC
  Administered 2014-01-29: 4 mg via ORAL
  Filled 2014-01-29: qty 1

## 2014-01-29 MED ORDER — ONDANSETRON HCL 4 MG PO TABS: 4.0000 mg | ORAL_TABLET | Freq: Three times a day (TID) | ORAL | Status: DC | PRN

## 2014-01-29 MED ORDER — LACTATED RINGERS IV BOLUS (SEPSIS)
1000.0000 mL | Freq: Once | INTRAVENOUS | Status: AC
  Administered 2014-01-29: 1000 mL via INTRAVENOUS

## 2014-01-29 NOTE — MAU Note (Signed)
Patient presents at 32 weeks with complaint of nausea, vomiting and abdominal pain X 2-3 days. Denies bleeding or LOF. Fetus active.

## 2014-01-29 NOTE — Discharge Instructions (Signed)
Third Trimester of Pregnancy °The third trimester is from week 29 through week 42, months 7 through 9. The third trimester is a time when the fetus is growing rapidly. At the end of the ninth month, the fetus is about 20 inches in length and weighs 6-10 pounds.  °BODY CHANGES °Your body goes through many changes during pregnancy. The changes vary from woman to woman.  °· Your weight will continue to increase. You can expect to gain 25-35 pounds (11-16 kg) by the end of the pregnancy. °· You may begin to get stretch marks on your hips, abdomen, and breasts. °· You may urinate more often because the fetus is moving lower into your pelvis and pressing on your bladder. °· You may develop or continue to have heartburn as a result of your pregnancy. °· You may develop constipation because certain hormones are causing the muscles that push waste through your intestines to slow down. °· You may develop hemorrhoids or swollen, bulging veins (varicose veins). °· You may have pelvic pain because of the weight gain and pregnancy hormones relaxing your joints between the bones in your pelvis. Backaches may result from overexertion of the muscles supporting your posture. °· You may have changes in your hair. These can include thickening of your hair, rapid growth, and changes in texture. Some women also have hair loss during or after pregnancy, or hair that feels dry or thin. Your hair will most likely return to normal after your baby is born. °· Your breasts will continue to grow and be tender. A yellow discharge may leak from your breasts called colostrum. °· Your belly button may stick out. °· You may feel short of breath because of your expanding uterus. °· You may notice the fetus "dropping," or moving lower in your abdomen. °· You may have a bloody mucus discharge. This usually occurs a few days to a week before labor begins. °· Your cervix becomes thin and soft (effaced) near your due date. °WHAT TO EXPECT AT YOUR PRENATAL  EXAMS  °You will have prenatal exams every 2 weeks until week 36. Then, you will have weekly prenatal exams. During a routine prenatal visit: °· You will be weighed to make sure you and the fetus are growing normally. °· Your blood pressure is taken. °· Your abdomen will be measured to track your baby's growth. °· The fetal heartbeat will be listened to. °· Any test results from the previous visit will be discussed. °· You may have a cervical check near your due date to see if you have effaced. °At around 36 weeks, your caregiver will check your cervix. At the same time, your caregiver will also perform a test on the secretions of the vaginal tissue. This test is to determine if a type of bacteria, Group B streptococcus, is present. Your caregiver will explain this further. °Your caregiver may ask you: °· What your birth plan is. °· How you are feeling. °· If you are feeling the baby move. °· If you have had any abnormal symptoms, such as leaking fluid, bleeding, severe headaches, or abdominal cramping. °· If you have any questions. °Other tests or screenings that may be performed during your third trimester include: °· Blood tests that check for low iron levels (anemia). °· Fetal testing to check the health, activity level, and growth of the fetus. Testing is done if you have certain medical conditions or if there are problems during the pregnancy. °FALSE LABOR °You may feel small, irregular contractions that   eventually go away. These are called Braxton Hicks contractions, or false labor. Contractions may last for hours, days, or even weeks before true labor sets in. If contractions come at regular intervals, intensify, or become painful, it is best to be seen by your caregiver.  °SIGNS OF LABOR  °· Menstrual-like cramps. °· Contractions that are 5 minutes apart or less. °· Contractions that start on the top of the uterus and spread down to the lower abdomen and back. °· A sense of increased pelvic pressure or back  pain. °· A watery or bloody mucus discharge that comes from the vagina. °If you have any of these signs before the 37th week of pregnancy, call your caregiver right away. You need to go to the hospital to get checked immediately. °HOME CARE INSTRUCTIONS  °· Avoid all smoking, herbs, alcohol, and unprescribed drugs. These chemicals affect the formation and growth of the baby. °· Follow your caregiver's instructions regarding medicine use. There are medicines that are either safe or unsafe to take during pregnancy. °· Exercise only as directed by your caregiver. Experiencing uterine cramps is a good sign to stop exercising. °· Continue to eat regular, healthy meals. °· Wear a good support bra for breast tenderness. °· Do not use hot tubs, steam rooms, or saunas. °· Wear your seat belt at all times when driving. °· Avoid raw meat, uncooked cheese, cat litter boxes, and soil used by cats. These carry germs that can cause birth defects in the baby. °· Take your prenatal vitamins. °· Try taking a stool softener (if your caregiver approves) if you develop constipation. Eat more high-fiber foods, such as fresh vegetables or fruit and whole grains. Drink plenty of fluids to keep your urine clear or pale yellow. °· Take warm sitz baths to soothe any pain or discomfort caused by hemorrhoids. Use hemorrhoid cream if your caregiver approves. °· If you develop varicose veins, wear support hose. Elevate your feet for 15 minutes, 3-4 times a day. Limit salt in your diet. °· Avoid heavy lifting, wear low heal shoes, and practice good posture. °· Rest a lot with your legs elevated if you have leg cramps or low back pain. °· Visit your dentist if you have not gone during your pregnancy. Use a soft toothbrush to brush your teeth and be gentle when you floss. °· A sexual relationship may be continued unless your caregiver directs you otherwise. °· Do not travel far distances unless it is absolutely necessary and only with the approval  of your caregiver. °· Take prenatal classes to understand, practice, and ask questions about the labor and delivery. °· Make a trial run to the hospital. °· Pack your hospital bag. °· Prepare the baby's nursery. °· Continue to go to all your prenatal visits as directed by your caregiver. °SEEK MEDICAL CARE IF: °· You are unsure if you are in labor or if your water has broken. °· You have dizziness. °· You have mild pelvic cramps, pelvic pressure, or nagging pain in your abdominal area. °· You have persistent nausea, vomiting, or diarrhea. °· You have a bad smelling vaginal discharge. °· You have pain with urination. °SEEK IMMEDIATE MEDICAL CARE IF:  °· You have a fever. °· You are leaking fluid from your vagina. °· You have spotting or bleeding from your vagina. °· You have severe abdominal cramping or pain. °· You have rapid weight loss or gain. °· You have shortness of breath with chest pain. °· You notice sudden or extreme swelling   of your face, hands, ankles, feet, or legs.  You have not felt your baby move in over an hour.  You have severe headaches that do not go away with medicine.  You have vision changes. Document Released: 01/27/2001 Document Revised: 02/07/2013 Document Reviewed: 04/05/2012 [facility] Patient Information 2015 [place], [place]. This information is not intended to replace advice given to you by your health care provider. Make sure you discuss any questions you have with your health care provider.    Dehydration, Adult Dehydration is when you lose more fluids from the body than you take in. Vital organs like the kidneys, brain, and heart cannot function without a proper amount of fluids and salt. Any loss of fluids from the body can cause dehydration.  CAUSES   Vomiting.  Diarrhea.  Excessive sweating.  Excessive urine output.  Fever. SYMPTOMS  Mild dehydration  Thirst.  Dry lips.  Slightly dry mouth. Moderate dehydration  Very dry mouth.  Sunken  eyes.  Skin does not bounce back quickly when lightly pinched and released.  Dark urine and decreased urine production.  Decreased tear production.  Headache. Severe dehydration  Very dry mouth.  Extreme thirst.  Rapid, weak pulse (more than 100 beats per minute at rest).  Cold hands and feet.  Not able to sweat in spite of heat and temperature.  Rapid breathing.  Blue lips.  Confusion and lethargy.  Difficulty being awakened.  Minimal urine production.  No tears. DIAGNOSIS  Your caregiver will diagnose dehydration based on your symptoms and your exam. Blood and urine tests will help confirm the diagnosis. The diagnostic evaluation should also identify the cause of dehydration. TREATMENT  Treatment of mild or moderate dehydration can often be done at home by increasing the amount of fluids that you drink. It is best to drink small amounts of fluid more often. Drinking too much at one time can make vomiting worse. Refer to the home care instructions below. Severe dehydration needs to be treated at the hospital where you will probably be given intravenous (IV) fluids that contain water and electrolytes. HOME CARE INSTRUCTIONS   Ask your caregiver about specific rehydration instructions.  Drink enough fluids to keep your urine clear or pale yellow.  Drink small amounts frequently if you have nausea and vomiting.  Eat as you normally do.  Avoid:  Foods or drinks high in sugar.  Carbonated drinks.  Juice.  Extremely hot or cold fluids.  Drinks with caffeine.  Fatty, greasy foods.  Alcohol.  Tobacco.  Overeating.  Gelatin desserts.  Wash your hands well to avoid spreading bacteria and viruses.  Only take over-the-counter or prescription medicines for pain, discomfort, or fever as directed by your caregiver.  Ask your caregiver if you should continue all prescribed and over-the-counter medicines.  Keep all follow-up appointments with your  caregiver. SEEK MEDICAL CARE IF:  You have abdominal pain and it increases or stays in one area (localizes).  You have a rash, stiff neck, or severe headache.  You are irritable, sleepy, or difficult to awaken.  You are weak, dizzy, or extremely thirsty. SEEK IMMEDIATE MEDICAL CARE IF:   You are unable to keep fluids down or you get worse despite treatment.  You have frequent episodes of vomiting or diarrhea.  You have blood or green matter (bile) in your vomit.  You have blood in your stool or your stool looks black and tarry.  You have not urinated in 6 to 8 hours, or you have only urinated a  small amount of very dark urine.  You have a fever.  You faint. MAKE SURE YOU:   Understand these instructions.  Will watch your condition.  Will get help right away if you are not doing well or get worse. Document Released: 02/02/2005 Document Revised: 04/27/2011 Document Reviewed: 09/22/2010 [facility] Patient Information 2015 [place], [place]. This information is not intended to replace advice given to you by your health care provider. Make sure you discuss any questions you have with your health care provider.

## 2014-01-29 NOTE — MAU Provider Note (Addendum)
History     CSN: [id number]  Arrival date and time: 01/29/14 1606   None     Chief Complaint  Patient presents with  . Nausea  . Emesis  . Abdominal Pain   HPI  Monique Gamble is a 23 y.o. [phone] at 6744w6d who presents to the MAU for evaluation of three days of nausea, vomiting, and inability to keep down any fluids. She reports severe nausea this pregnancy requiring multiple trips to the MAU for evaluation. States she was in her usual state until a few days ago when the nausea became so severe that she was vomiting up everything she ate. Now with dizziness. Denies fevers, chills, cough, URI symptoms, chest pain, shortness of breath. +Abdominal cramping but no pain. Denies diarrhea. Denies dysuria, vaginal discharge. No sick contacts.   +Fetal movement. Denies contractions, loss of fluid, vaginal bleeding.   OB History    Gravida Para Term Preterm AB TAB SAB Ectopic Multiple Living   4 2 2  0 1 1 0 0 0 2      Past Medical History  Diagnosis Date  . Chlamydia   . Headache     Past Surgical History  Procedure Laterality Date  . Induced abortion      Family History  Problem Relation Age of Onset  . Anesthesia problems Neg Hx   . Hypertension Mother     History  Substance Use Topics  . Smoking status: Never Smoker   . Smokeless tobacco: Never Used  . Alcohol Use: No    Allergies: No Known Allergies  Prescriptions prior to admission  Medication Sig Dispense Refill Last Dose  . calcium carbonate (TUMS - DOSED IN MG ELEMENTAL CALCIUM) 500 MG chewable tablet Chew 2 tablets by mouth 2 (two) times daily as needed for indigestion or heartburn.   01/28/2014 at Unknown time  . Pediatric Multiple Vit-C-FA (FLINSTONES GUMMIES OMEGA-3 DHA) CHEW Chew 2 tablets by mouth daily.   01/28/2014 at Unknown time    Review of Systems  Constitutional: Negative.  Negative for fever, chills and malaise/fatigue.  HENT: Negative.  Negative for congestion and sore throat.   Eyes:  Negative.  Negative for double vision and photophobia.  Respiratory: Negative.  Negative for cough, shortness of breath and wheezing.   Cardiovascular: Negative.  Negative for chest pain and leg swelling.  Gastrointestinal: Positive for nausea and vomiting. Negative for abdominal pain, diarrhea and constipation.  Genitourinary: Negative.  Negative for dysuria, urgency, frequency and hematuria.  Musculoskeletal: Negative.  Negative for myalgias.  Skin: Negative.   Neurological: Positive for dizziness. Negative for weakness and headaches.  Psychiatric/Behavioral: Negative.   All other systems reviewed and are negative.  Physical Exam   Blood pressure 121/69, pulse 102, temperature 97.9 F (36.6 C), temperature source Oral, resp. rate 18, height 5\' 6"  (1.676 m), weight 180 lb 8 oz (81.874 kg), last menstrual period 06/07/2013.  Physical Exam  Nursing note and vitals reviewed. Constitutional: She is oriented to person, place, and time. She appears well-developed and well-nourished. No distress.  HENT:  Head: Normocephalic and atraumatic.  Cardiovascular: Normal rate.   Respiratory: Effort normal.  GI: Soft. There is no tenderness.  Gravid   Neurological: She is alert and oriented to person, place, and time.  Skin: Skin is warm and dry.  Psychiatric: She has a normal mood and affect. Her behavior is normal.    MAU Course  Procedures  MDM VSS UA +ketones 1L LR bolus CMP Zofran ODT x 1  PO Challenge  NST reactive  Assessment and Plan  Monique Gamble is a 23 y.o. [id number] at 170w6d who presents to the MAU for evaluation of nausea/vomiting.  # Mild dehydration likely secondary to nausea/vomiting of pregnancy - Urinalysis with +ketones and SG 1.020 - Afebrile with stable vital signs - CMP wnl - Tolerated PO challenge after fluids, antiemetic - Stable for discharge home - Rx Zofran - Follow up in clinic - FM/PTL precautions given   William DaltonMcEachern, [place] 01/29/2014, 6:23 PM

## 2014-01-29 NOTE — MAU Note (Signed)
Started throwing up 2-3 days ago. States she cannot hold anything down, not even water. Also c/o lower abdominal pain that comes and goes.

## 2014-02-09 ENCOUNTER — Encounter (HOSPITAL_COMMUNITY): Payer: Self-pay

## 2014-02-09 ENCOUNTER — Inpatient Hospital Stay (HOSPITAL_COMMUNITY)
Admission: AD | Admit: 2014-02-09 | Discharge: 2014-02-10 | Disposition: A | Discharge status: Home / Self Care | Payer: Medicaid Other | Source: Ambulatory Visit | Attending: Obstetrics & Gynecology | Admitting: Obstetrics & Gynecology

## 2014-02-09 DIAGNOSIS — O99013 Anemia complicating pregnancy, third trimester: Secondary | ICD-10-CM | POA: Diagnosis not present

## 2014-02-09 DIAGNOSIS — O26899 Other specified pregnancy related conditions, unspecified trimester: Secondary | ICD-10-CM

## 2014-02-09 DIAGNOSIS — R102 Pelvic and perineal pain: Secondary | ICD-10-CM

## 2014-02-09 DIAGNOSIS — D649 Anemia, unspecified: Secondary | ICD-10-CM | POA: Diagnosis not present

## 2014-02-09 DIAGNOSIS — O0933 Supervision of pregnancy with insufficient antenatal care, third trimester: Secondary | ICD-10-CM | POA: Insufficient documentation

## 2014-02-09 DIAGNOSIS — O9989 Other specified diseases and conditions complicating pregnancy, childbirth and the puerperium: Secondary | ICD-10-CM | POA: Diagnosis not present

## 2014-02-09 DIAGNOSIS — Z3A34 34 weeks gestation of pregnancy: Secondary | ICD-10-CM | POA: Insufficient documentation

## 2014-02-09 DIAGNOSIS — N949 Unspecified condition associated with female genital organs and menstrual cycle: Secondary | ICD-10-CM | POA: Insufficient documentation

## 2014-02-09 LAB — DIFFERENTIAL
BASOS ABS: 0 10*3/uL (ref 0.0–0.1)
BASOS PCT: 0 % (ref 0–1)
EOS ABS: 0.1 10*3/uL (ref 0.0–0.7)
Eosinophils Relative: 1 % (ref 0–5)
Lymphocytes Relative: 32 % (ref 12–46)
Lymphs Abs: 3 10*3/uL (ref 0.7–4.0)
Monocytes Absolute: 1.1 10*3/uL — ABNORMAL HIGH (ref 0.1–1.0)
Monocytes Relative: 12 % (ref 3–12)
Neutro Abs: 5.2 10*3/uL (ref 1.7–7.7)
Neutrophils Relative %: 55 % (ref 43–77)

## 2014-02-09 LAB — CBC
HCT: 29.7 % — ABNORMAL LOW (ref 36.0–46.0)
Hemoglobin: 9.3 g/dL — ABNORMAL LOW (ref 12.0–15.0)
MCH: 24.9 pg — AB (ref 26.0–34.0)
MCHC: 31.3 g/dL (ref 30.0–36.0)
MCV: 79.6 fL (ref 78.0–100.0)
PLATELETS: 208 10*3/uL (ref 150–400)
RBC: 3.73 MIL/uL — ABNORMAL LOW (ref 3.87–5.11)
RDW: 14.3 % (ref 11.5–15.5)
WBC: 9.5 10*3/uL (ref 4.0–10.5)

## 2014-02-09 LAB — OB RESULTS CONSOLE GC/CHLAMYDIA
CHLAMYDIA, DNA PROBE: NEGATIVE
Gonorrhea: NEGATIVE

## 2014-02-09 LAB — URINALYSIS, ROUTINE W REFLEX MICROSCOPIC
BILIRUBIN URINE: NEGATIVE
Glucose, UA: NEGATIVE mg/dL
HGB URINE DIPSTICK: NEGATIVE
Ketones, ur: NEGATIVE mg/dL
Nitrite: NEGATIVE
PH: 7.5 (ref 5.0–8.0)
Protein, ur: NEGATIVE mg/dL
SPECIFIC GRAVITY, URINE: 1.01 (ref 1.005–1.030)
UROBILINOGEN UA: 0.2 mg/dL (ref 0.0–1.0)

## 2014-02-09 LAB — URINE MICROSCOPIC-ADD ON

## 2014-02-09 MED ORDER — CYCLOBENZAPRINE HCL 10 MG PO TABS
10.0000 mg | ORAL_TABLET | Freq: Once | ORAL | Status: AC
  Administered 2014-02-09: 10 mg via ORAL
  Filled 2014-02-09: qty 1

## 2014-02-09 NOTE — MAU Note (Addendum)
Pain in pelvic area since 0900. Can't put legs together. Denies LOF or bleeding. No PNC

## 2014-02-09 NOTE — MAU Provider Note (Signed)
  History     CSN: [id number]  Arrival date and time: 02/09/14 2242   None     Chief Complaint  Patient presents with  . Contractions   HPI  Pt is a 23 yo G4P2012 at 4348w3d wks IUP here with report of pain in pelvic area since 0900. Pain is described as pressure and intermittent sharp pain on left side.  "Can't put legs together" due to pain in groin area.  . Denies LOF or bleeding. No PNC.  Two appointments scheduled with Dr. Gaynell FaceMarshall however did not make them.  Not able to get childcare for the youngest child.    Past Medical History  Diagnosis Date  . Chlamydia   . Headache     Past Surgical History  Procedure Laterality Date  . Induced abortion      Family History  Problem Relation Age of Onset  . Anesthesia problems Neg Hx   . Hypertension Mother     History  Substance Use Topics  . Smoking status: Never Smoker   . Smokeless tobacco: Never Used  . Alcohol Use: No    Allergies: No Known Allergies  Prescriptions prior to admission  Medication Sig Dispense Refill Last Dose  . acetaminophen (TYLENOL) 325 MG tablet Take 650 mg by mouth every 6 (six) hours as needed.   02/09/2014 at Unknown time  . calcium carbonate (TUMS - DOSED IN MG ELEMENTAL CALCIUM) 500 MG chewable tablet Chew 2 tablets by mouth 2 (two) times daily as needed for indigestion or heartburn.   02/09/2014 at Unknown time  . ondansetron (ZOFRAN) 4 MG tablet Take 1 tablet (4 mg total) by mouth every 8 (eight) hours as needed for nausea or vomiting. 20 tablet 0 02/08/2014 at Unknown time  . Pediatric Multiple Vit-C-FA (FLINSTONES GUMMIES OMEGA-3 DHA) CHEW Chew 2 tablets by mouth daily.   02/08/2014 at Unknown time    Review of Systems  Gastrointestinal: Positive for abdominal pain (contractions).  Genitourinary:       Pelvic pressure  All other systems reviewed and are negative.  Physical Exam   Blood pressure 128/69, pulse 89, temperature 98.4 F (36.9 C), resp. rate 18, height 5\' 6"  (1.676 m),  weight 82.283 kg (181 lb 6.4 oz), last menstrual period 06/07/2013.  Physical Exam  Constitutional: She is oriented to person, place, and time. She appears well-developed and well-nourished.  HENT:  Head: Normocephalic.  Neck: Normal range of motion. Neck supple.  Cardiovascular: Normal rate, regular rhythm and normal heart sounds.   Respiratory: Effort normal and breath sounds normal.  GI: Soft. There is no tenderness.  Genitourinary: No bleeding in the vagina. Vaginal discharge (mucusy) found.  Neurological: She is alert and oriented to person, place, and time.  Skin: Skin is warm and dry.   Dilation: 1 Effacement (%): Thick Cervical Position: Posterior Exam by:: Margarita MailW. Karim, CNM   MAU Course  Procedures  Assessment and Plan  23 yo [phone] at 2548w3d wks IUP Round Ligament Pain Category I FHR Tracing Anemia No Prenatal Care  Plan: Discharge to home OB panel collected GC/CT collected RX Inte[facility] Message routed [facility]; explained can bring child to the visit.  Rochele PagesKARIM, Terralyn Matsumura N 02/09/2014, 11:28 PM

## 2014-02-10 DIAGNOSIS — N949 Unspecified condition associated with female genital organs and menstrual cycle: Secondary | ICD-10-CM

## 2014-02-10 DIAGNOSIS — D649 Anemia, unspecified: Secondary | ICD-10-CM

## 2014-02-10 DIAGNOSIS — O9989 Other specified diseases and conditions complicating pregnancy, childbirth and the puerperium: Secondary | ICD-10-CM

## 2014-02-10 DIAGNOSIS — O99013 Anemia complicating pregnancy, third trimester: Secondary | ICD-10-CM

## 2014-02-10 LAB — TYPE AND SCREEN
ABO/RH(D): O POS
ANTIBODY SCREEN: NEGATIVE

## 2014-02-10 LAB — HIV ANTIBODY (ROUTINE TESTING W REFLEX): HIV 1&2 Ab, 4th Generation: NONREACTIVE

## 2014-02-10 LAB — HEPATITIS B SURFACE ANTIGEN: Hepatitis B Surface Ag: NEGATIVE

## 2014-02-10 LAB — RPR

## 2014-02-10 MED ORDER — INTEGRA F 125-1 MG PO CAPS: 1.0000 | ORAL_CAPSULE | Freq: Every day | ORAL | Status: DC

## 2014-02-10 NOTE — Discharge Instructions (Signed)
ROUND LIGAMENT PAIN ° °Round ligament pain is most common during the second trimester. Women may have a sharp pain in their abdomen or hip area that is either on one side or both. Some women even report pain that extends into the groin area. Round ligament pain is considered a normal part of pregnancy as your body goes through many different changes. ° °What Causes Round Ligament Pain? ° °The round ligament supports the uterus and stretches during pregnancy.  It connects the front portion of the uterus to the groin. These ligaments contract and relax like muscles, but much more slowly. Any movement (including going from a sitting position to standing position quickly, laughing, or coughing) that stretches these ligaments, by making the ligaments contract quickly, can cause a woman to experience pain.  Round ligament pain should only last for a few seconds. ° °What Can Be Done To Alleviate Round Ligament Pain? ° °Rest is one of the best ways to help with this kind of pain. Changing positions slowly allows the ligaments to stretch more gradually and can help alleviate any pain. If you know that you are going to sneeze, cough, or laugh you can bend and flex your hips, which can reduce the pull on the ligaments. ° °If you are having consistent round ligament pain your health care provider may recommend daily stretching exercises.  The most common exercise is done by placing your hands and knees on the floor, lowering your head to the floor, and keeping your bottom in the air. ° °When Should I Call My Health Care Provider? ° °If the pain persists after resting, or it is accompanied by severe pain, you would want to notify your health care provider. If the pain lasts for more than a few minutes you should contact your health care provider immediately. You would also want to notify your health care provider if the pain is accompanied by any bleeding, cramping, fever, chills, nausea, vomiting, or change in vaginal  discharge. °

## 2014-02-12 LAB — GC/CHLAMYDIA PROBE AMP
CT Probe RNA: NEGATIVE
GC Probe RNA: NEGATIVE

## 2014-02-12 LAB — OB RESULTS CONSOLE GBS: GBS: NEGATIVE

## 2014-02-13 ENCOUNTER — Encounter: Payer: Self-pay | Admitting: Family

## 2014-02-13 DIAGNOSIS — O9989 Other specified diseases and conditions complicating pregnancy, childbirth and the puerperium: Secondary | ICD-10-CM

## 2014-02-13 DIAGNOSIS — Z283 Underimmunization status: Secondary | ICD-10-CM | POA: Insufficient documentation

## 2014-02-13 DIAGNOSIS — O09899 Supervision of other high risk pregnancies, unspecified trimester: Secondary | ICD-10-CM | POA: Insufficient documentation

## 2014-02-13 LAB — RUBELLA SCREEN: RUBELLA: 0.44 {index} (ref <0.90)

## 2014-02-14 ENCOUNTER — Ambulatory Visit (INDEPENDENT_AMBULATORY_CARE_PROVIDER_SITE_OTHER): Payer: Medicaid Other | Admitting: Family Medicine

## 2014-02-14 ENCOUNTER — Encounter: Payer: Self-pay | Admitting: Family Medicine

## 2014-02-14 VITALS — BP 113/70 | HR 119 | Temp 98.4°F | Wt 177.0 lb

## 2014-02-14 DIAGNOSIS — O0933 Supervision of pregnancy with insufficient antenatal care, third trimester: Secondary | ICD-10-CM | POA: Insufficient documentation

## 2014-02-14 DIAGNOSIS — Z3483 Encounter for supervision of other normal pregnancy, third trimester: Secondary | ICD-10-CM

## 2014-02-14 NOTE — Patient Instructions (Signed)
Third Trimester of Pregnancy The third trimester is from week 29 through week 42, months 7 through 9. The third trimester is a time when the fetus is growing rapidly. At the end of the ninth month, the fetus is about 20 inches in length and weighs 6-10 pounds.  BODY CHANGES Your body goes through many changes during pregnancy. The changes vary from woman to woman.   Your weight will continue to increase. You can expect to gain 25-35 pounds (11-16 kg) by the end of the pregnancy.  You may begin to get stretch marks on your hips, abdomen, and breasts.  You may urinate more often because the fetus is moving lower into your pelvis and pressing on your bladder.  You may develop or continue to have heartburn as a result of your pregnancy.  You may develop constipation because certain hormones are causing the muscles that push waste through your intestines to slow down.  You may develop hemorrhoids or swollen, bulging veins (varicose veins).  You may have pelvic pain because of the weight gain and pregnancy hormones relaxing your joints between the bones in your pelvis. Backaches may result from overexertion of the muscles supporting your posture.  You may have changes in your hair. These can include thickening of your hair, rapid growth, and changes in texture. Some women also have hair loss during or after pregnancy, or hair that feels dry or thin. Your hair will most likely return to normal after your baby is born.  Your breasts will continue to grow and be tender. A yellow discharge may leak from your breasts called colostrum.  Your belly button may stick out.  You may feel short of breath because of your expanding uterus.  You may notice the fetus "dropping," or moving lower in your abdomen.  You may have a bloody mucus discharge. This usually occurs a few days to a week before labor begins.  Your cervix becomes thin and soft (effaced) near your due date. WHAT TO EXPECT AT YOUR PRENATAL  EXAMS  You will have prenatal exams every 2 weeks until week 36. Then, you will have weekly prenatal exams. During a routine prenatal visit:  You will be weighed to make sure you and the fetus are growing normally.  Your blood pressure is taken.  Your abdomen will be measured to track your baby's growth.  The fetal heartbeat will be listened to.  Any test results from the previous visit will be discussed.  You may have a cervical check near your due date to see if you have effaced. At around 36 weeks, your caregiver will check your cervix. At the same time, your caregiver will also perform a test on the secretions of the vaginal tissue. This test is to determine if a type of bacteria, Group B streptococcus, is present. Your caregiver will explain this further. Your caregiver may ask you:  What your birth plan is.  How you are feeling.  If you are feeling the baby move.  If you have had any abnormal symptoms, such as leaking fluid, bleeding, severe headaches, or abdominal cramping.  If you have any questions. Other tests or screenings that may be performed during your third trimester include:  Blood tests that check for low iron levels (anemia).  Fetal testing to check the health, activity level, and growth of the fetus. Testing is done if you have certain medical conditions or if there are problems during the pregnancy. FALSE LABOR You may feel small, irregular contractions that   eventually go away. These are called Braxton Hicks contractions, or false labor. Contractions may last for hours, days, or even weeks before true labor sets in. If contractions come at regular intervals, intensify, or become painful, it is best to be seen by your caregiver.  SIGNS OF LABOR   Menstrual-like cramps.  Contractions that are 5 minutes apart or less.  Contractions that start on the top of the uterus and spread down to the lower abdomen and back.  A sense of increased pelvic pressure or back  pain.  A watery or bloody mucus discharge that comes from the vagina. If you have any of these signs before the 37th week of pregnancy, call your caregiver right away. You need to go to the hospital to get checked immediately. HOME CARE INSTRUCTIONS   Avoid all smoking, herbs, alcohol, and unprescribed drugs. These chemicals affect the formation and growth of the baby.  Follow your caregiver's instructions regarding medicine use. There are medicines that are either safe or unsafe to take during pregnancy.  Exercise only as directed by your caregiver. Experiencing uterine cramps is a good sign to stop exercising.  Continue to eat regular, healthy meals.  Wear a good support bra for breast tenderness.  Do not use hot tubs, steam rooms, or saunas.  Wear your seat belt at all times when driving.  Avoid raw meat, uncooked cheese, cat litter boxes, and soil used by cats. These carry germs that can cause birth defects in the baby.  Take your prenatal vitamins.  Try taking a stool softener (if your caregiver approves) if you develop constipation. Eat more high-fiber foods, such as fresh vegetables or fruit and whole grains. Drink plenty of fluids to keep your urine clear or pale yellow.  Take warm sitz baths to soothe any pain or discomfort caused by hemorrhoids. Use hemorrhoid cream if your caregiver approves.  If you develop varicose veins, wear support hose. Elevate your feet for 15 minutes, 3-4 times a day. Limit salt in your diet.  Avoid heavy lifting, wear low heal shoes, and practice good posture.  Rest a lot with your legs elevated if you have leg cramps or low back pain.  Visit your dentist if you have not gone during your pregnancy. Use a soft toothbrush to brush your teeth and be gentle when you floss.  A sexual relationship may be continued unless your caregiver directs you otherwise.  Do not travel far distances unless it is absolutely necessary and only with the approval  of your caregiver.  Take prenatal classes to understand, practice, and ask questions about the labor and delivery.  Make a trial run to the hospital.  Pack your hospital bag.  Prepare the baby's nursery.  Continue to go to all your prenatal visits as directed by your caregiver. SEEK MEDICAL CARE IF:  You are unsure if you are in labor or if your water has broken.  You have dizziness.  You have mild pelvic cramps, pelvic pressure, or nagging pain in your abdominal area.  You have persistent nausea, vomiting, or diarrhea.  You have a bad smelling vaginal discharge.  You have pain with urination. SEEK IMMEDIATE MEDICAL CARE IF:   You have a fever.  You are leaking fluid from your vagina.  You have spotting or bleeding from your vagina.  You have severe abdominal cramping or pain.  You have rapid weight loss or gain.  You have shortness of breath with chest pain.  You notice sudden or extreme swelling   of your face, hands, ankles, feet, or legs.  You have not felt your baby move in over an hour.  You have severe headaches that do not go away with medicine.  You have vision changes. Document Released: 01/27/2001 Document Revised: 02/07/2013 Document Reviewed: 04/05/2012 ExitCare Patient Information 2015 [employer]. This information is not intended to replace advice given to you by your health care provider. Make sure you discuss any questions you have with your health care provider.  

## 2014-02-14 NOTE — Addendum Note (Signed)
Addended by: Louanna RawAMPBELL, Kairo Laubacher M on: 02/14/2014 04:03 PM   Modules accepted: Orders

## 2014-02-14 NOTE — Progress Notes (Signed)
Pt c/o constant pelvic pain. 1 HR GTT today.

## 2014-02-14 NOTE — Progress Notes (Signed)
   Subjective:    Monique Gamble is a [id number] 4958w0d being seen today for her first obstetrical visit.  Her obstetrical history is significant for insufficient care. Patient does not intend to breast feed. Pregnancy history fully reviewed.  Patient reports no bleeding, no cramping, no leaking and occasional contractions.  Filed Vitals:   02/14/14 1504  BP: 113/70  Pulse: 119  Temp: 98.4 F (36.9 C)  Weight: 177 lb (80.287 kg)    HISTORY: OB History  Gravida Para Term Preterm AB SAB TAB Ectopic Multiple Living  4 2 2  0 1 0 1 0 0 2    # Outcome Date GA Lbr Len/2nd Weight Sex Delivery Anes PTL Lv  4 Current           3 Term 08/15/11 615w4d 01:38 / 00:14 6 lb 12.1 oz (3.065 kg) F Vag-Spont None  Y  2 Term 2009 5119w0d  6 lb 9 oz (2.977 kg) F Vag-Spont   Y  1 TAB 2005             Past Medical History  Diagnosis Date  . Chlamydia   . Headache    Past Surgical History  Procedure Laterality Date  . Induced abortion     Family History  Problem Relation Age of Onset  . Anesthesia problems Neg Hx   . Hypertension Mother   . Diabetes Sister      Exam    Uterus:  Fundal Height: 36 cm  Pelvic Exam:    Perineum: No Hemorrhoids   Vulva: normal  System:     Skin: normal coloration and turgor, no rashes    Neurologic: gait normal; reflexes normal and symmetric   Extremities: normal strength, tone, and muscle mass   HEENT PERRLA and extra ocular movement intact   Mouth/Teeth mucous membranes moist, pharynx normal without lesions   Neck supple and no masses   Cardiovascular: regular rate and rhythm, no murmurs or gallops   Respiratory:  appears well, vitals normal, no respiratory distress, acyanotic, normal RR, ear and throat exam is normal, neck free of mass or lymphadenopathy, chest clear, no wheezing, crepitations, rhonchi, normal symmetric air entry   Abdomen: soft, non-tender; bowel sounds normal; no masses,  no organomegaly   Urinary: urethral meatus normal       Assessment:    Pregnancy: [id number] Patient Active Problem List   Diagnosis Date Noted  . Insufficient prenatal care in third trimester 02/14/2014  . Rubella non-immune status, antepartum 02/13/2014        Plan:     Initial labs drawn. Prenatal vitamins. Problem list reviewed and updated. Genetic Screening - presented too late for screen.  Ultrasound discussed; fetal survey: results reviewed.  Follow up in 1 weeks. 90% of 30 min visit spent on counseling and coordination of care.  GBS done today.  PNL already done.   [name], JACOB JEHIEL 02/14/2014

## 2014-02-15 LAB — GLUCOSE TOLERANCE, 1 HOUR (50G) W/O FASTING: GLUCOSE 1 HOUR GTT: 91 mg/dL (ref 70–140)

## 2014-02-16 NOTE — L&D Delivery Note (Signed)
Delivery Note At 4:17 PM a viable female was delivered via Vaginal, Spontaneous Delivery (Presentation: Left Occiput Anterior).  APGAR: 9, 9; weight pending  .   Placenta status: intact  Cord: 3 vessels with the following complications: None.  Cord pH: na  Anesthesia: None  Episiotomy: None Lacerations:  Superficial labial that did not require repair Suture Repair: na Est. Blood Loss (mL):  150 ml  Mom to postpartum.  Baby to Couplet care / Skin to Skin.  Rolm BookbinderMoss, Ankita Newcomer 03/15/2014, 4:33 PM

## 2014-02-16 NOTE — L&D Delivery Note (Signed)
Delivery Note Patient admitted in active labor with a precipitous  delivery. At 1:11 AM a viable female was delivered via Vaginal, Spontaneous Delivery (Presentation: Left Occiput Anterior).   APGAR: 8, 9; weight 6 lb 3 oz (2807 g).   Patient with lose nuchal and true knot is umbilical cord. Placenta status: Intact, Spontaneous.  Cord: 3 vessels with the following complications: Knot.  Cord pH: Not collected.   Anesthesia: None  Episiotomy: None Lacerations: None  Suture Repair: None Est. Blood Loss (mL):  804  Mom to postpartum.  Baby to Couplet care / Skin to Skin.  Monique Gamble 01/21/2015, 1:27 AM  OB fellow attestation: Patient is a [id number] at 2964w3d who was admitted and progressed quickly.  I was gloved and present for delivery in its entirety.   Complications: none  Lacerations: none  EBL: 804  Federico FlakeKimberly Niles Kenson Groh, MD 9:00 AM

## 2014-02-17 LAB — CULTURE, BETA STREP (GROUP B ONLY)

## 2014-02-21 ENCOUNTER — Encounter: Payer: Self-pay | Admitting: Advanced Practice Midwife

## 2014-02-21 ENCOUNTER — Ambulatory Visit (INDEPENDENT_AMBULATORY_CARE_PROVIDER_SITE_OTHER): Payer: Medicaid Other | Admitting: Advanced Practice Midwife

## 2014-02-21 VITALS — BP 121/78 | HR 98 | Wt 180.3 lb

## 2014-02-21 DIAGNOSIS — O99013 Anemia complicating pregnancy, third trimester: Secondary | ICD-10-CM

## 2014-02-21 DIAGNOSIS — D649 Anemia, unspecified: Secondary | ICD-10-CM

## 2014-02-21 DIAGNOSIS — Z23 Encounter for immunization: Secondary | ICD-10-CM

## 2014-02-21 DIAGNOSIS — O219 Vomiting of pregnancy, unspecified: Secondary | ICD-10-CM

## 2014-02-21 DIAGNOSIS — O0933 Supervision of pregnancy with insufficient antenatal care, third trimester: Secondary | ICD-10-CM

## 2014-02-21 LAB — POCT URINALYSIS DIP (DEVICE)
GLUCOSE, UA: NEGATIVE mg/dL
Hgb urine dipstick: NEGATIVE
KETONES UR: NEGATIVE mg/dL
Leukocytes, UA: NEGATIVE
NITRITE: NEGATIVE
PH: 8.5 — AB (ref 5.0–8.0)
PROTEIN: 30 mg/dL — AB
SPECIFIC GRAVITY, URINE: 1.015 (ref 1.005–1.030)
UROBILINOGEN UA: 4 mg/dL — AB (ref 0.0–1.0)

## 2014-02-21 MED ORDER — INTEGRA F 125-1 MG PO CAPS: 1.0000 | ORAL_CAPSULE | Freq: Every day | ORAL | Status: DC

## 2014-02-21 MED ORDER — PROMETHAZINE HCL 25 MG PO TABS: 25.0000 mg | ORAL_TABLET | Freq: Four times a day (QID) | ORAL | Status: DC | PRN

## 2014-02-21 MED ORDER — TETANUS-DIPHTH-ACELL PERTUSSIS 5-2.5-18.5 LF-MCG/0.5 IM SUSP
0.5000 mL | Freq: Once | INTRAMUSCULAR | Status: AC
  Administered 2014-02-21: 0.5 mL via INTRAMUSCULAR

## 2014-02-21 NOTE — Patient Instructions (Signed)
Braxton Hicks Contractions Contractions of the uterus can occur throughout pregnancy. Contractions are not always a sign that you are in labor.  WHAT ARE BRAXTON HICKS CONTRACTIONS?  Contractions that occur before labor are called Braxton Hicks contractions, or false labor. Toward the end of pregnancy (32-34 weeks), these contractions can develop more often and may become more forceful. This is not true labor because these contractions do not result in opening (dilatation) and thinning of the cervix. They are sometimes difficult to tell apart from true labor because these contractions can be forceful and people have different pain tolerances. You should not feel embarrassed if you go to the hospital with false labor. Sometimes, the only way to tell if you are in true labor is for your health care provider to look for changes in the cervix. If there are no prenatal problems or other health problems associated with the pregnancy, it is completely safe to be sent home with false labor and await the onset of true labor. HOW CAN YOU TELL THE DIFFERENCE BETWEEN TRUE AND FALSE LABOR? False Labor  The contractions of false labor are usually shorter and not as hard as those of true labor.   The contractions are usually irregular.   The contractions are often felt in the front of the lower abdomen and in the groin.   The contractions may go away when you walk around or change positions while lying down.   The contractions get weaker and are shorter lasting as time goes on.   The contractions do not usually become progressively stronger, regular, and closer together as with true labor.  True Labor  Contractions in true labor last 30-70 seconds, become very regular, usually become more intense, and increase in frequency.   The contractions do not go away with walking.   The discomfort is usually felt in the top of the uterus and spreads to the lower abdomen and low back.   True labor can be  determined by your health care provider with an exam. This will show that the cervix is dilating and getting thinner.  WHAT TO REMEMBER  Keep up with your usual exercises and follow other instructions given by your health care provider.   Take medicines as directed by your health care provider.   Keep your regular prenatal appointments.   Eat and drink lightly if you think you are going into labor.   If Braxton Hicks contractions are making you uncomfortable:   Change your position from lying down or resting to walking, or from walking to resting.   Sit and rest in a tub of warm water.   Drink 2-3 glasses of water. Dehydration may cause these contractions.   Do slow and deep breathing several times an hour.  WHEN SHOULD I SEEK IMMEDIATE MEDICAL CARE? Seek immediate medical care if:  Your contractions become stronger, more regular, and closer together.   You have fluid leaking or gushing from your vagina.   You have a fever.   You pass blood-tinged mucus.   You have vaginal bleeding.   You have continuous abdominal pain.   You have low back pain that you never had before.   You feel your baby's head pushing down and causing pelvic pressure.   Your baby is not moving as much as it used to.  Document Released: 02/02/2005 Document Revised: 02/07/2013 Document Reviewed: 11/14/2012 ExitCare Patient Information 2015 [employer]. This information is not intended to replace advice given to you by your health care   provider. Make sure you discuss any questions you have with your health care provider.  Tdap Vaccine (Tetanus, Diphtheria, Pertussis): What You Need to Know 1. Why get vaccinated? Tetanus, diphtheria and pertussis can be very serious diseases, even for adolescents and adults. Tdap vaccine can protect us from these diseases. TETANUS (Lockjaw) causes painful muscle tightening and stiffness, usually all over the body.  It can lead to tightening of  muscles in the head and neck so you can't open your mouth, swallow, or sometimes even breathe. Tetanus kills about 1 out of 5 people who are infected. DIPHTHERIA can cause a thick coating to form in the back of the throat.  It can lead to breathing problems, paralysis, heart failure, and death. PERTUSSIS (Whooping Cough) causes severe coughing spells, which can cause difficulty breathing, vomiting and disturbed sleep.  It can also lead to weight loss, incontinence, and rib fractures. Up to 2 in 100 adolescents and 5 in 100 adults with pertussis are hospitalized or have complications, which could include pneumonia or death. These diseases are caused by bacteria. Diphtheria and pertussis are spread from person to person through coughing or sneezing. Tetanus enters the body through cuts, scratches, or wounds. Before vaccines, the [place] saw as many as 200,000 cases a year of diphtheria and pertussis, and hundreds of cases of tetanus. Since vaccination began, tetanus and diphtheria have dropped by about 99% and pertussis by about 80%. 2. Tdap vaccine Tdap vaccine can protect adolescents and adults from tetanus, diphtheria, and pertussis. One dose of Tdap is routinely given at age 26 or 5612. People who did not get Tdap at that age should get it as soon as possible. Tdap is especially important for health care professionals and anyone having close contact with a baby younger than 12 months. Pregnant women should get a dose of Tdap during every pregnancy, to protect the newborn from pertussis. Infants are most at risk for severe, life-threatening complications from pertussis. A similar vaccine, called Td, protects from tetanus and diphtheria, but not pertussis. A Td booster should be given every 10 years. Tdap may be given as one of these boosters if you have not already gotten a dose. Tdap may also be given after a severe cut or burn to prevent tetanus infection. Your doctor can give you more  information. Tdap may safely be given at the same time as other vaccines. 3. Some people should not get this vaccine  If you ever had a life-threatening allergic reaction after a dose of any tetanus, diphtheria, or pertussis containing vaccine, OR if you have a severe allergy to any part of this vaccine, you should not get Tdap. Tell your doctor if you have any severe allergies.  If you had a coma, or long or multiple seizures within 7 days after a childhood dose of DTP or DTaP, you should not get Tdap, unless a cause other than the vaccine was found. You can still get Td.  Talk to your doctor if you:  have epilepsy or another nervous system problem,  had severe pain or swelling after any vaccine containing diphtheria, tetanus or pertussis,  ever had Guillain-Barr Syndrome (GBS),  aren't feeling well on the day the shot is scheduled. 4. Risks of a vaccine reaction With any medicine, including vaccines, there is a chance of side effects. These are usually mild and go away on their own, but serious reactions are also possible. Brief fainting spells can follow a vaccination, leading to injuries from falling. Sitting or  lying down for about 15 minutes can help prevent these. Tell your doctor if you feel dizzy or light-headed, or have vision changes or ringing in the ears. Mild problems following Tdap (Did not interfere with activities)  Pain where the shot was given (about 3 in 4 adolescents or 2 in 3 adults)  Redness or swelling where the shot was given (about 1 person in 5)  Mild fever of at least 100.41F (up to about 1 in 25 adolescents or 1 in 100 adults)  Headache (about 3 or 4 people in 10)  Tiredness (about 1 person in 3 or 4)  Nausea, vomiting, diarrhea, stomach ache (up to 1 in 4 adolescents or 1 in 10 adults)  Chills, body aches, sore joints, rash, swollen glands (uncommon) Moderate problems following Tdap (Interfered with activities, but did not require medical  attention)  Pain where the shot was given (about 1 in 5 adolescents or 1 in 100 adults)  Redness or swelling where the shot was given (up to about 1 in 16 adolescents or 1 in 25 adults)  Fever over 102F (about 1 in 100 adolescents or 1 in 250 adults)  Headache (about 3 in 20 adolescents or 1 in 10 adults)  Nausea, vomiting, diarrhea, stomach ache (up to 1 or 3 people in 100)  Swelling of the entire arm where the shot was given (up to about 3 in 100). Severe problems following Tdap (Unable to perform usual activities; required medical attention)  Swelling, severe pain, bleeding and redness in the arm where the shot was given (rare). A severe allergic reaction could occur after any vaccine (estimated less than 1 in a million doses). 5. What if there is a serious reaction? What should I look for?  Look for anything that concerns you, such as signs of a severe allergic reaction, very high fever, or behavior changes. Signs of a severe allergic reaction can include hives, swelling of the face and throat, difficulty breathing, a fast heartbeat, dizziness, and weakness. These would start a few minutes to a few hours after the vaccination. What should I do?  If you think it is a severe allergic reaction or other emergency that can't wait, call 9-1-1 or get the person to the nearest hospital. Otherwise, call your doctor.  Afterward, the reaction should be reported to the "Vaccine Adverse Event Reporting System" (VAERS). Your doctor might file this report, or you can do it yourself through the VAERS web site at www.vaers.LAgents.no, or by calling 1-(904)526-5968. VAERS is only for reporting reactions. They do not give medical advice.  6. The National Vaccine Injury Compensation Program The [employer] Vaccine Injury Compensation Program (VICP) is a federal program that was created to compensate people who may have been injured by certain vaccines. Persons who believe they may have been injured by a  vaccine can learn about the program and about filing a claim by calling 1-9086156348 or visiting the VICP website at SpiritualWord.at. 7. How can I learn more?  Ask your doctor.  Call your local or state health department.  Contact the Centers for Disease Control and Prevention (CDC):  Call 774-833-5760 or visit CDC's website at PicCapture.uy. CDC Tdap Vaccine VIS (06/25/11) Document Released: 08/04/2011 Document Revised: 06/19/2013 Document Reviewed: 05/17/2013 ExitCare Patient Information 2015 [place], [place]. This information is not intended to replace advice given to you by your health care provider. Make sure you discuss any questions you have with your health care provider.

## 2014-02-21 NOTE — Addendum Note (Signed)
Addended by: Gerome ApleyZEYFANG, Donnetta Gillin L on: 02/21/2014 09:35 AM   Modules accepted: Orders

## 2014-02-21 NOTE — Progress Notes (Signed)
Pt reports "light" contractions.

## 2014-02-23 ENCOUNTER — Encounter: Payer: Self-pay | Admitting: *Deleted

## 2014-02-28 ENCOUNTER — Encounter: Payer: Medicaid Other | Admitting: Advanced Practice Midwife

## 2014-03-03 ENCOUNTER — Inpatient Hospital Stay (HOSPITAL_COMMUNITY)
Admission: AD | Admit: 2014-03-03 | Discharge: 2014-03-03 | Disposition: A | Discharge status: Home / Self Care | Payer: Medicaid Other | Source: Ambulatory Visit | Attending: Obstetrics & Gynecology | Admitting: Obstetrics & Gynecology

## 2014-03-03 ENCOUNTER — Encounter (HOSPITAL_COMMUNITY): Payer: Self-pay | Admitting: *Deleted

## 2014-03-03 DIAGNOSIS — O471 False labor at or after 37 completed weeks of gestation: Secondary | ICD-10-CM | POA: Diagnosis present

## 2014-03-03 DIAGNOSIS — Z3A38 38 weeks gestation of pregnancy: Secondary | ICD-10-CM | POA: Diagnosis not present

## 2014-03-03 NOTE — Discharge Instructions (Signed)

## 2014-03-03 NOTE — Progress Notes (Signed)
Monique Gamble/ student notified of pt's complaints, VE, contraction pattern, orders received to discharge home

## 2014-03-03 NOTE — MAU Note (Signed)
Pt states u/c's since this am. Denies bleeding or lof. Was 1cm in clinic

## 2014-03-09 ENCOUNTER — Encounter: Payer: Medicaid Other | Admitting: Advanced Practice Midwife

## 2014-03-12 ENCOUNTER — Encounter: Payer: Medicaid Other | Admitting: Advanced Practice Midwife

## 2014-03-14 ENCOUNTER — Ambulatory Visit (INDEPENDENT_AMBULATORY_CARE_PROVIDER_SITE_OTHER): Payer: Medicaid Other | Admitting: Physician Assistant

## 2014-03-14 VITALS — BP 117/87 | HR 92 | Wt 185.6 lb

## 2014-03-14 DIAGNOSIS — Z3483 Encounter for supervision of other normal pregnancy, third trimester: Secondary | ICD-10-CM

## 2014-03-14 DIAGNOSIS — O0933 Supervision of pregnancy with insufficient antenatal care, third trimester: Secondary | ICD-10-CM

## 2014-03-14 LAB — POCT URINALYSIS DIP (DEVICE)
BILIRUBIN URINE: NEGATIVE
Glucose, UA: NEGATIVE mg/dL
Hgb urine dipstick: NEGATIVE
Ketones, ur: NEGATIVE mg/dL
Leukocytes, UA: NEGATIVE
NITRITE: NEGATIVE
PROTEIN: NEGATIVE mg/dL
Specific Gravity, Urine: 1.015 (ref 1.005–1.030)
Urobilinogen, UA: 1 mg/dL (ref 0.0–1.0)
pH: 7 (ref 5.0–8.0)

## 2014-03-14 NOTE — Progress Notes (Signed)
Pt reports some pressure and contractions. Pt experiencing n/v

## 2014-03-14 NOTE — Progress Notes (Signed)
40 weeks.  Denies LOF, dysuria, vaginal bleeding/discharge.  Endorses good fetal movement.  CTX are irregular.  Labor precautions Will schedule twice weekly testing/IOL at 41 weeks.

## 2014-03-15 ENCOUNTER — Encounter (HOSPITAL_COMMUNITY): Payer: Self-pay | Admitting: *Deleted

## 2014-03-15 ENCOUNTER — Inpatient Hospital Stay (HOSPITAL_COMMUNITY)
Admission: AD | Admit: 2014-03-15 | Discharge: 2014-03-17 | DRG: 775 | Disposition: A | Discharge status: Home / Self Care | Payer: Medicaid Other | Source: Ambulatory Visit | Attending: Obstetrics & Gynecology | Admitting: Obstetrics & Gynecology

## 2014-03-15 DIAGNOSIS — Z3A40 40 weeks gestation of pregnancy: Secondary | ICD-10-CM | POA: Diagnosis present

## 2014-03-15 DIAGNOSIS — Z283 Underimmunization status: Secondary | ICD-10-CM

## 2014-03-15 DIAGNOSIS — O09899 Supervision of other high risk pregnancies, unspecified trimester: Secondary | ICD-10-CM

## 2014-03-15 DIAGNOSIS — O0933 Supervision of pregnancy with insufficient antenatal care, third trimester: Secondary | ICD-10-CM | POA: Diagnosis not present

## 2014-03-15 DIAGNOSIS — O9989 Other specified diseases and conditions complicating pregnancy, childbirth and the puerperium: Secondary | ICD-10-CM

## 2014-03-15 HISTORY — DX: Personal history of diseases of the blood and blood-forming organs and certain disorders involving the immune mechanism: Z86.2

## 2014-03-15 LAB — RAPID URINE DRUG SCREEN, HOSP PERFORMED
Amphetamines: NOT DETECTED
BENZODIAZEPINES: NOT DETECTED
Barbiturates: NOT DETECTED
COCAINE: NOT DETECTED
Opiates: NOT DETECTED
TETRAHYDROCANNABINOL: NOT DETECTED

## 2014-03-15 LAB — TYPE AND SCREEN
ABO/RH(D): O POS
Antibody Screen: NEGATIVE

## 2014-03-15 LAB — CBC
HCT: 33.1 % — ABNORMAL LOW (ref 36.0–46.0)
Hemoglobin: 10.3 g/dL — ABNORMAL LOW (ref 12.0–15.0)
MCH: 24.1 pg — ABNORMAL LOW (ref 26.0–34.0)
MCHC: 31.1 g/dL (ref 30.0–36.0)
MCV: 77.3 fL — ABNORMAL LOW (ref 78.0–100.0)
Platelets: 205 10*3/uL (ref 150–400)
RBC: 4.28 MIL/uL (ref 3.87–5.11)
RDW: 15.8 % — ABNORMAL HIGH (ref 11.5–15.5)
WBC: 9.5 10*3/uL (ref 4.0–10.5)

## 2014-03-15 MED ORDER — ACETAMINOPHEN 325 MG PO TABS: 650.0000 mg | ORAL_TABLET | ORAL | Status: DC | PRN

## 2014-03-15 MED ORDER — FLEET ENEMA 7-19 GM/118ML RE ENEM
1.0000 | ENEMA | RECTAL | Status: DC | PRN
Start: 2014-03-15 — End: 2014-03-15

## 2014-03-15 MED ORDER — SIMETHICONE 80 MG PO CHEW
80.0000 mg | CHEWABLE_TABLET | ORAL | Status: DC | PRN
  Administered 2014-03-16: 80 mg via ORAL
  Filled 2014-03-15: qty 1

## 2014-03-15 MED ORDER — LACTATED RINGERS IV SOLN
INTRAVENOUS | Status: DC
Start: 2014-03-15 — End: 2014-03-15
  Administered 2014-03-15: 14:00:00 via INTRAVENOUS

## 2014-03-15 MED ORDER — WITCH HAZEL-GLYCERIN EX PADS
1.0000 "application " | MEDICATED_PAD | CUTANEOUS | Status: DC | PRN
  Administered 2014-03-16: 1 via TOPICAL

## 2014-03-15 MED ORDER — FENTANYL CITRATE 0.05 MG/ML IJ SOLN
100.0000 ug | INTRAMUSCULAR | Status: DC | PRN
  Administered 2014-03-15 (×2): 100 ug via INTRAVENOUS
  Filled 2014-03-15 (×2): qty 2

## 2014-03-15 MED ORDER — TETANUS-DIPHTH-ACELL PERTUSSIS 5-2.5-18.5 LF-MCG/0.5 IM SUSP: 0.5000 mL | Freq: Once | INTRAMUSCULAR | Status: DC

## 2014-03-15 MED ORDER — LACTATED RINGERS IV SOLN: 500.0000 mL | INTRAVENOUS | Status: DC | PRN

## 2014-03-15 MED ORDER — BENZOCAINE-MENTHOL 20-0.5 % EX AERO: 1.0000 "application " | INHALATION_SPRAY | CUTANEOUS | Status: DC | PRN

## 2014-03-15 MED ORDER — SODIUM CHLORIDE 0.9 % IV SOLN: 250.0000 mL | INTRAVENOUS | Status: DC | PRN

## 2014-03-15 MED ORDER — OXYTOCIN 40 UNITS IN LACTATED RINGERS INFUSION - SIMPLE MED
62.5000 mL/h | INTRAVENOUS | Status: DC
  Administered 2014-03-15: 62.5 mL/h via INTRAVENOUS
  Filled 2014-03-15: qty 1000

## 2014-03-15 MED ORDER — SENNOSIDES-DOCUSATE SODIUM 8.6-50 MG PO TABS
2.0000 | ORAL_TABLET | ORAL | Status: DC
  Administered 2014-03-16 (×2): 2 via ORAL
  Filled 2014-03-15 (×2): qty 2

## 2014-03-15 MED ORDER — PRENATAL MULTIVITAMIN CH
1.0000 | ORAL_TABLET | Freq: Every day | ORAL | Status: DC
  Administered 2014-03-16: 1 via ORAL
  Filled 2014-03-15: qty 1

## 2014-03-15 MED ORDER — ONDANSETRON HCL 4 MG/2ML IJ SOLN: 4.0000 mg | Freq: Four times a day (QID) | INTRAMUSCULAR | Status: DC | PRN

## 2014-03-15 MED ORDER — LANOLIN HYDROUS EX OINT: TOPICAL_OINTMENT | CUTANEOUS | Status: DC | PRN

## 2014-03-15 MED ORDER — SODIUM CHLORIDE 0.9 % IJ SOLN: 3.0000 mL | Freq: Two times a day (BID) | INTRAMUSCULAR | Status: DC

## 2014-03-15 MED ORDER — ONDANSETRON HCL 4 MG/2ML IJ SOLN: 4.0000 mg | INTRAMUSCULAR | Status: DC | PRN

## 2014-03-15 MED ORDER — DIBUCAINE 1 % RE OINT
1.0000 "application " | TOPICAL_OINTMENT | RECTAL | Status: DC | PRN
  Administered 2014-03-16: 1 via RECTAL
  Filled 2014-03-15: qty 28

## 2014-03-15 MED ORDER — SODIUM CHLORIDE 0.9 % IJ SOLN: 3.0000 mL | INTRAMUSCULAR | Status: DC | PRN

## 2014-03-15 MED ORDER — PROMETHAZINE HCL 25 MG/ML IJ SOLN: 12.5000 mg | INTRAMUSCULAR | Status: DC | PRN

## 2014-03-15 MED ORDER — IBUPROFEN 600 MG PO TABS
600.0000 mg | ORAL_TABLET | Freq: Four times a day (QID) | ORAL | Status: DC
  Administered 2014-03-16 – 2014-03-17 (×6): 600 mg via ORAL
  Filled 2014-03-15 (×6): qty 1

## 2014-03-15 MED ORDER — ONDANSETRON HCL 4 MG PO TABS: 4.0000 mg | ORAL_TABLET | ORAL | Status: DC | PRN

## 2014-03-15 MED ORDER — ZOLPIDEM TARTRATE 5 MG PO TABS: 5.0000 mg | ORAL_TABLET | Freq: Every evening | ORAL | Status: DC | PRN

## 2014-03-15 MED ORDER — CITRIC ACID-SODIUM CITRATE 334-500 MG/5ML PO SOLN
30.0000 mL | ORAL | Status: DC | PRN
Start: 2014-03-15 — End: 2014-03-15

## 2014-03-15 MED ORDER — LIDOCAINE HCL (PF) 1 % IJ SOLN
30.0000 mL | INTRAMUSCULAR | Status: DC | PRN
  Filled 2014-03-15: qty 30

## 2014-03-15 MED ORDER — DIPHENHYDRAMINE HCL 25 MG PO CAPS: 25.0000 mg | ORAL_CAPSULE | Freq: Four times a day (QID) | ORAL | Status: DC | PRN

## 2014-03-15 NOTE — Progress Notes (Signed)
Rene PaciQuendra L Rhew is a 24 y.o. [phone] at 6640w1d by LMP admitted for active labor  Subjective:   Objective: BP 131/77 mmHg  Pulse 73  Resp 20  Ht 5\' 4"  (1.626 m)  Wt 83.915 kg (185 lb)  BMI 31.74 kg/m2  SpO2 100%  LMP 06/07/2013 (Approximate)      FHT:  FHR: 130 bpm, variability: moderate,  accelerations:  Present,  decelerations:  Absent SVE:   Dilation: 5 Effacement (%): 90 Station: 0 Exam by:: K.Wilosn,RN  Labs: Lab Results  Component Value Date   WBC 9.5 03/15/2014   HGB 10.3* 03/15/2014   HCT 33.1* 03/15/2014   MCV 77.3* 03/15/2014   PLT 205 03/15/2014    Assessment / Plan: Spontaneous labor, progressing normally  Labor: Progressing normally, AROM @ around 1600 Preeclampsia:  no signs or symptoms of toxicity Fetal Wellbeing:  Category I Pain Control:  Fentanyl I/D:  GBS neg Anticipated MOD:  NSVD  Stefan Markarian 03/15/2014, 4:03 PM

## 2014-03-15 NOTE — H&P (Signed)
Monique Gamble is a 89 [id number] 3315w1d presents for active labor. Pregnancy complicated by late presentation to prenatal care, 3rd trimester. Rubella 0.44. Contractions started early this morning. Pt is now very uncomfortable and hot.  . History OB History    Gravida Para Term Preterm AB TAB SAB Ectopic Multiple Living   4 2 2  0 1 1 0 0 0 2     [facility] at 36 weeks Prenatal Labs  Dating LMP Blood type: --/--/O POS (12/25 2340)  Genetic Screen 1 Screen: too late          AFP:                    Quad:                  NIPS: Antibody:NEG (12/25 2340)  Anatomic US normal Rubella: 0.44 (12/25 2340)  GTT Early:               Third trimester: 91 RPR: NON REAC (12/25 2340)   TDaP vaccine 02/21/13 HBsAg: NEGATIVE (12/25 2340)   Flu vaccine Declined until PP HIV: NONREACTIVE (12/25 2340)   GBS neg GBS: neg  Contraception BTL vs mirena Pap:  Baby Food bottle   Circumcision Yes   Pediatrician [facility]   Support Person Nani RavensKenny Robinson     Past Medical History  Diagnosis Date  . Chlamydia   . Headache    Past Surgical History  Procedure Laterality Date  . Induced abortion     Family History: family history includes Diabetes in her sister; Hypertension in her mother. There is no history of Anesthesia problems. Social History:  reports that she has never smoked. She has never used smokeless tobacco. She reports that she does not drink alcohol or use illicit drugs.    ROS Negative except for HPI  Dilation: 5 Effacement (%): 80 Station: -1 Exam by:: K.Wilosn,RN Blood pressure 122/83, pulse 119, resp. rate 18, height 5\' 4"  (1.626 m), weight 83.915 kg (185 lb), last menstrual period 06/07/2013. Exam Physical Exam  Constitutional: She is oriented to person, place, and time. She appears distressed.  unkempt  Respiratory: Effort normal.  Musculoskeletal: Normal range of motion.  Neurological: She is alert and oriented to person, place, and time.  Skin: Skin is warm  and dry.    Prenatal labs: ABO, Rh: --/--/O POS (12/25 2340) Antibody: NEG (12/25 2340) Rubella: 0.44 (12/25 2340) RPR: NON REAC (12/25 2340)  HBsAg: NEGATIVE (12/25 2340)  HIV: NONREACTIVE (12/25 2340)  GBS: Negative (12/28 0000)   Assessment/Plan: Patient is 24 y.o. [id number] 4115w1d presents for active labor. Pregnancy complicated by late presentation to prenatal care.  # active labor- expectant management  # pain management- IV pain meds  # ID- GBS neg  # MOF- bottle  # MOC- IUD vs. BTL (03/24/14)  # baby boy- desires circ   Rolm BookbinderMoss, Loreena Valeri 03/15/2014, 2:13 PM

## 2014-03-15 NOTE — Progress Notes (Signed)
FHR close to MHR reading and tracing intermittent with MHR while pt moving with ucs.  Pt agitated and moving monitors away as RN attempting to assess fhr

## 2014-03-15 NOTE — MAU Note (Signed)
Pt reports cts since this morning on and off. Denies SROM or bleeding and good fetal movement reported.

## 2014-03-16 ENCOUNTER — Encounter (HOSPITAL_COMMUNITY): Payer: Self-pay | Admitting: *Deleted

## 2014-03-16 LAB — RPR: RPR Ser Ql: NONREACTIVE

## 2014-03-16 MED ORDER — MEASLES, MUMPS & RUBELLA VAC ~~LOC~~ INJ
0.5000 mL | INJECTION | Freq: Once | SUBCUTANEOUS | Status: DC
  Filled 2014-03-16 (×2): qty 0.5

## 2014-03-16 NOTE — Progress Notes (Addendum)
Post Partum Day #1 Subjective: no complaints, up ad lib, voiding and tolerating PO  Objective: Blood pressure 120/76, pulse 81, temperature 98.2 F (36.8 C), temperature source Oral, resp. rate 18, height 5\' 4"  (1.626 m), weight 83.915 kg (185 lb), last menstrual period 06/07/2013, SpO2 99 %, unknown if currently breastfeeding.  Physical Exam:  General: alert, cooperative and no distress Lochia: appropriate Uterine Fundus: firm Incision: n/a DVT Evaluation: No evidence of DVT seen on physical exam. Negative Homan's sign. No cords or calf tenderness. No significant calf/ankle edema.   Recent Labs  03/15/14 1420  HGB 10.3*  HCT 33.1*    Assessment/Plan: Plan for discharge tomorrow and Contraception plans BTL   LOS: 1 day   Monique Gamble,Monique Gamble 03/16/2014, 6:08 AM   I have seen and examined this patient and agree the above assessment. CRESENZO-DISHMAN,Deondrea Aguado 03/16/2014 7:28 AM

## 2014-03-16 NOTE — Progress Notes (Signed)
Ur chart review completed.  

## 2014-03-17 NOTE — Discharge Summary (Signed)
Obstetric Discharge Summary Reason for Admission: onset of labor Prenatal Procedures: none Intrapartum Procedures: spontaneous vaginal delivery Postpartum Procedures: none Complications-Operative and Postpartum: none HEMOGLOBIN  Date Value Ref Range Status  03/15/2014 10.3* 12.0 - 15.0 g/dL Final   HCT  Date Value Ref Range Status  03/15/2014 33.1* 36.0 - 46.0 % Final   Delivery Note At 4:17 PM a viable female was delivered via Vaginal, Spontaneous Delivery (Presentation: Left Occiput Anterior). APGAR: 9, 9; weight pending .  Placenta status: intact Cord: 3 vessels with the following complications: None. Cord pH: na  Anesthesia: None  Episiotomy: None Lacerations: Superficial labial that did not require repair Suture Repair: na Est. Blood Loss (mL): 150 ml  Mom to postpartum. Baby to Couplet care / Skin to Skin.  Rolm BookbinderMoss, Amber 03/15/2014, 4:33 PM   Today: doing well, no complaints, pain well controlled.  Would like interval BTL, considering depo in the mean time.  Bottle feeding  Physical Exam:  General: alert and cooperative Lochia: appropriate Uterine Fundus: firm DVT Evaluation: No evidence of DVT seen on physical exam.  Discharge Diagnoses: Term Pregnancy-delivered  Discharge Information: Date: 03/17/2014 Activity: pelvic rest Diet: routine Medications: None Condition: stable Instructions: refer to practice specific booklet Discharge to: home Follow-up Information    Follow up In 5 weeks.      Newborn Data: Live born female  Birth Weight: 7 lb 0.7 oz (3195 g) APGAR: 9, 9  Home with mother.  Brin Ruggerio ROCIO 03/17/2014, 7:33 AM

## 2014-03-17 NOTE — Discharge Instructions (Signed)

## 2014-03-17 NOTE — Plan of Care (Signed)
Problem: Discharge Progression Outcomes Goal: MMR given as ordered Outcome: Not Applicable Date Met:  17/00/17 Declined MMR vaccine, information sheet given

## 2014-03-19 ENCOUNTER — Other Ambulatory Visit: Payer: Medicaid Other

## 2014-03-21 ENCOUNTER — Inpatient Hospital Stay (HOSPITAL_COMMUNITY): Payer: Medicaid Other

## 2014-04-25 ENCOUNTER — Ambulatory Visit: Payer: Medicaid Other | Admitting: Obstetrics & Gynecology

## 2014-04-26 ENCOUNTER — Ambulatory Visit: Payer: Medicaid Other | Admitting: Obstetrics & Gynecology

## 2014-04-26 ENCOUNTER — Encounter: Payer: Self-pay | Admitting: *Deleted

## 2014-04-26 ENCOUNTER — Telehealth: Payer: Self-pay | Admitting: *Deleted

## 2014-04-26 NOTE — Telephone Encounter (Signed)
Attempted to contact patient with missed appointment, no answer, left message.  Will send letter.  Letter sent.

## 2014-07-20 ENCOUNTER — Emergency Department (HOSPITAL_COMMUNITY)
Admission: EM | Admit: 2014-07-20 | Discharge: 2014-07-20 | Disposition: A | Discharge status: Home / Self Care | Payer: Medicaid Other | Attending: Emergency Medicine | Admitting: Emergency Medicine

## 2014-07-20 ENCOUNTER — Encounter (HOSPITAL_COMMUNITY): Payer: Self-pay | Admitting: *Deleted

## 2014-07-20 DIAGNOSIS — O9989 Other specified diseases and conditions complicating pregnancy, childbirth and the puerperium: Secondary | ICD-10-CM | POA: Diagnosis present

## 2014-07-20 DIAGNOSIS — O2340 Unspecified infection of urinary tract in pregnancy, unspecified trimester: Secondary | ICD-10-CM | POA: Diagnosis not present

## 2014-07-20 DIAGNOSIS — B86 Scabies: Secondary | ICD-10-CM | POA: Diagnosis not present

## 2014-07-20 DIAGNOSIS — N390 Urinary tract infection, site not specified: Secondary | ICD-10-CM

## 2014-07-20 DIAGNOSIS — O99019 Anemia complicating pregnancy, unspecified trimester: Secondary | ICD-10-CM | POA: Diagnosis not present

## 2014-07-20 DIAGNOSIS — Z79899 Other long term (current) drug therapy: Secondary | ICD-10-CM | POA: Diagnosis not present

## 2014-07-20 DIAGNOSIS — Z3A00 Weeks of gestation of pregnancy not specified: Secondary | ICD-10-CM | POA: Insufficient documentation

## 2014-07-20 DIAGNOSIS — Z3490 Encounter for supervision of normal pregnancy, unspecified, unspecified trimester: Secondary | ICD-10-CM

## 2014-07-20 DIAGNOSIS — D649 Anemia, unspecified: Secondary | ICD-10-CM | POA: Insufficient documentation

## 2014-07-20 DIAGNOSIS — O98819 Other maternal infectious and parasitic diseases complicating pregnancy, unspecified trimester: Secondary | ICD-10-CM | POA: Insufficient documentation

## 2014-07-20 LAB — URINALYSIS, ROUTINE W REFLEX MICROSCOPIC
BILIRUBIN URINE: NEGATIVE
Glucose, UA: NEGATIVE mg/dL
Hgb urine dipstick: NEGATIVE
KETONES UR: NEGATIVE mg/dL
Nitrite: POSITIVE — AB
PH: 6.5 (ref 5.0–8.0)
PROTEIN: NEGATIVE mg/dL
SPECIFIC GRAVITY, URINE: 1.025 (ref 1.005–1.030)
Urobilinogen, UA: 1 mg/dL (ref 0.0–1.0)

## 2014-07-20 LAB — URINE MICROSCOPIC-ADD ON

## 2014-07-20 MED ORDER — CEPHALEXIN 500 MG PO CAPS: 500.0000 mg | ORAL_CAPSULE | Freq: Two times a day (BID) | ORAL | Status: DC

## 2014-07-20 MED ORDER — PERMETHRIN 5 % EX CREA: TOPICAL_CREAM | CUTANEOUS | Status: DC

## 2014-07-20 MED ORDER — DOXYCYCLINE HYCLATE 100 MG PO CAPS
100.0000 mg | ORAL_CAPSULE | Freq: Two times a day (BID) | ORAL | Status: DC
Start: 2014-07-20 — End: 2014-07-20

## 2014-07-20 NOTE — Discharge Instructions (Signed)
You have been diagnosed with scabies. Please apply permethrin cream from neck to toes and leave it on for 8-10 hours then wash off completely. You may repeat the process in one week if your rash still persist. Make sure to wash belongings in hot water. Take antibiotic for urinary tract infection. Your pregnancy test is positive.  Please follow up with an OBGYN for further management of your pregnancy  Scabies Scabies are small bugs (mites) that burrow under the skin and cause red bumps and severe itching. These bugs can only be seen with a microscope. Scabies are highly contagious. They can spread easily from person to person by direct contact. They are also spread through sharing clothing or linens that have the scabies mites living in them. It is not unusual for an entire family to become infected through shared towels, clothing, or bedding.  HOME CARE INSTRUCTIONS   Your caregiver may prescribe a cream or lotion to kill the mites. If cream is prescribed, massage the cream into the entire body from the neck to the bottom of both feet. Also massage the cream into the scalp and face if your child is less than 794 year old. Avoid the eyes and mouth. Do not wash your hands after application.  Leave the cream on for 8 to 12 hours. Your child should bathe or shower after the 8 to 12 hour application period. Sometimes it is helpful to apply the cream to your child right before bedtime.  One treatment is usually effective and will eliminate approximately 95% of infestations. For severe cases, your caregiver may decide to repeat the treatment in 1 week. Everyone in your household should be treated with one application of the cream.  New rashes or burrows should not appear within 24 to 48 hours after successful treatment. However, the itching and rash may last for 2 to 4 weeks after successful treatment. Your caregiver may prescribe a medicine to help with the itching or to help the rash go away more  quickly.  Scabies can live on clothing or linens for up to 3 days. All of your child's recently used clothing, towels, stuffed toys, and bed linens should be washed in hot water and then dried in a dryer for at least 20 minutes on high heat. Items that cannot be washed should be enclosed in a plastic bag for at least 3 days.  To help relieve itching, bathe your child in a cool bath or apply cool washcloths to the affected areas.  Your child may return to school after treatment with the prescribed cream. SEEK MEDICAL CARE IF:   The itching persists longer than 4 weeks after treatment.  The rash spreads or becomes infected. Signs of infection include red blisters or yellow-tan crust. Document Released: 02/02/2005 Document Revised: 04/27/2011 Document Reviewed: 06/13/2008 [facility] Patient Information 2015 [place]. This information is not intended to replace advice given to you by your health care provider. Make sure you discuss any questions you have with your health care provider.  Urinary Tract Infection A urinary tract infection (UTI) can occur any place along the urinary tract. The tract includes the kidneys, ureters, bladder, and urethra. A type of germ called bacteria often causes a UTI. UTIs are often helped with antibiotic medicine.  HOME CARE   If given, take antibiotics as told by your doctor. Finish them even if you start to feel better.  Drink enough fluids to keep your pee (urine) clear or pale yellow.  Avoid tea,  drinks with caffeine, and bubbly (carbonated) drinks.  Pee often. Avoid holding your pee in for a long time.  Pee before and after having sex (intercourse).  Wipe from front to back after you poop (bowel movement) if you are a woman. Use each tissue only once. GET HELP RIGHT AWAY IF:   You have back pain.  You have lower belly (abdominal) pain.  You have chills.  You feel sick to your stomach (nauseous).  You throw up (vomit).  Your burning or  discomfort with peeing does not go away.  You have a fever.  Your symptoms are not better in 3 days. MAKE SURE YOU:   Understand these instructions.  Will watch your condition.  Will get help right away if you are not doing well or get worse. Document Released: 07/22/2007 Document Revised: 10/28/2011 Document Reviewed: 09/03/2011 [facility] Patient Information 2015 [place], [place]. This information is not intended to replace advice given to you by your health care provider. Make sure you discuss any questions you have with your health care provider.

## 2014-07-20 NOTE — ED Provider Notes (Signed)
CSN: [id number]     Arrival date & time 07/20/14  1552 History   This chart was scribed for Fayrene Helper, PA-C working with Linwood Dibbles, MD by Evon Slack, ED Scribe. This patient was seen in room TR01C/TR01C and the patient's care was started at 4:55 PM.      Chief Complaint  Patient presents with  . Rash   Patient is a 24 y.o. female presenting with rash. The history is provided by the patient. No language interpreter was used.  Rash Associated symptoms: no fever and no shortness of breath    HPI Comments: Monique Gamble is a 24 y.o. female who presents to the Emergency Department complaining of itchy rash onset onset 2 weeks prior. Pt states that the rash is progressively worsening, the rash started in the webs of her finger and has spread to her abdomen, back and legs. Pt states she has tried hydrocortisone cream with no relief. Pt states she has tried benadryl that only provided temporary relief. She denies fever, chills, SOB. She does report recently moving from house to house recently staying with friends. She states that her son has similar rash and has recently been treated for scabies. She also report recently using new soaps.   Pt is also complaining of urinary frequency onset 1 week prior and is requesting to have her urine tested. Denies dysuria, vaginal discharge, or vaginal pain. LMP between April or May 2016 she states she is unsure of exact date.    Past Medical History  Diagnosis Date  . Chlamydia   . Headache   . History of anemia    Past Surgical History  Procedure Laterality Date  . Induced abortion     Family History  Problem Relation Age of Onset  . Anesthesia problems Neg Hx   . Hypertension Mother   . Diabetes Sister    History  Substance Use Topics  . Smoking status: Never Smoker   . Smokeless tobacco: Never Used  . Alcohol Use: No   OB History    Gravida Para Term Preterm AB TAB SAB Ectopic Multiple Living   0 1 1 0 0 0 3      Review  of Systems  Constitutional: Negative for fever and chills.  HENT: Negative for trouble swallowing.   Respiratory: Negative for shortness of breath.   Genitourinary: Positive for frequency. Negative for dysuria, vaginal bleeding and vaginal discharge.  Skin: Positive for rash.    Allergies  Review of patient's allergies indicates no known allergies.  Home Medications   Prior to Admission medications   Medication Sig Start Date End Date Taking? Authorizing Provider  calcium carbonate (TUMS - DOSED IN MG ELEMENTAL CALCIUM) 500 MG chewable tablet Chew 2 tablets by mouth 2 (two) times daily as needed for indigestion or heartburn.    Historical Provider, MD  Fe Fum-FePoly-FA-Vit C-Vit B3 (INTEGRA F) 125-1 MG CAPS Take 1 tablet by mouth daily. Patient not taking: Reported on 03/14/2014 02/21/14   Dorathy Kinsman, CNM  ondansetron (ZOFRAN) 4 MG tablet Take 1 tablet (4 mg total) by mouth every 8 (eight) hours as needed for nausea or vomiting. Patient not taking: Reported on 02/14/2014 01/29/14   Erasmo Downer, MD  Pediatric Multiple Vit-C-FA (FLINSTONES GUMMIES OMEGA-3 DHA) CHEW Chew 2 tablets by mouth daily.    Historical Provider, MD  promethazine (PHENERGAN) 25 MG tablet Take 1 tablet (25 mg total) by mouth every 6 (six) hours as needed for nausea or  vomiting. 02/21/14   Koleen NimrodVirginia Smith, CNM   BP 125/76 mmHg  Pulse 90  Temp(Src) 98.5 F (36.9 C) (Oral)  Resp 20  Wt 164 lb 3.2 oz (74.481 kg)  SpO2 100%  LMP 06/19/2014   Physical Exam  Constitutional: She is oriented to person, place, and time. She appears well-developed and well-nourished. No distress.  HENT:  Head: Normocephalic and atraumatic.  Eyes: Conjunctivae and EOM are normal.  Neck: Neck supple. No tracheal deviation present.  Cardiovascular: Normal rate.   Pulmonary/Chest: Effort normal. No respiratory distress.  Musculoskeletal: Normal range of motion.  Neurological: She is alert and oriented to person, place, and time.   Skin: Skin is warm and dry. Rash noted. Rash is papular.  Multiple papular lesions throughout bilateral arms, web spaces of hands with excoriation marks.   Psychiatric: She has a normal mood and affect. Her behavior is normal.  Nursing note and vitals reviewed.   ED Course  Procedures (including critical care time) DIAGNOSTIC STUDIES: Oxygen Saturation is 100% on RA, normal by my interpretation.    COORDINATION OF CARE: 5:16 PM-Discussed treatment plan with pt at bedside and pt agreed to plan.   Evidence of scabies. Furthermore patient also has evidence of urinary tract infection and her pregnancy test is positive. Patient will be treated for urinary tract infection with Keflex. Scabies treatment with permethrin and further care instruction provided. She has no significant abdominal pain or vaginal bleeding therefore recommend follow-up with her OB/GYN for further pregnancy care.  At the time of this dictation, pregnancy test has not resulted however nurse did call the lab and confirm positive pregnancy.  Labs Review Labs Reviewed  URINALYSIS, ROUTINE W REFLEX MICROSCOPIC (NOT [facility]) - Abnormal; Notable for the following:    APPearance CLOUDY (*)    Nitrite POSITIVE (*)    Leukocytes, UA SMALL (*)    All other components within normal limits  URINE MICROSCOPIC-ADD ON - Abnormal; Notable for the following:    Squamous Epithelial / LPF FEW (*)    Bacteria, UA MANY (*)    All other components within normal limits  POC URINE PREG, ED    Imaging Review No results found.   EKG Interpretation None      MDM   Final diagnoses:  Scabies infestation  UTI (lower urinary tract infection)  Pregnancy    BP 108/67 mmHg  Pulse 94  Temp(Src) 98.4 F (36.9 C) (Oral)  Resp 18  Wt 164 lb 3.2 oz (74.481 kg)  SpO2 100%  LMP 06/19/2014   I personally performed the services described in this documentation, which was scribed in my presence. The recorded information has been  reviewed and is accurate.       Fayrene HelperBowie Jaunita Mikels, PA-C 07/20/14 2321  Linwood DibblesJon Knapp, MD 07/23/14 1630

## 2014-07-20 NOTE — ED Notes (Signed)
The pt has had a rasdh for 2 weeks her child was just diagnosed with scabies in [place].  She also wanys her urine checked.  lmp  One month ago

## 2014-08-09 ENCOUNTER — Emergency Department (HOSPITAL_COMMUNITY)
Admission: EM | Admit: 2014-08-09 | Discharge: 2014-08-09 | Disposition: A | Discharge status: Home / Self Care | Payer: Medicaid Other | Attending: Emergency Medicine | Admitting: Emergency Medicine

## 2014-08-09 ENCOUNTER — Emergency Department (HOSPITAL_COMMUNITY): Payer: Medicaid Other

## 2014-08-09 ENCOUNTER — Encounter (HOSPITAL_COMMUNITY): Payer: Self-pay | Admitting: *Deleted

## 2014-08-09 DIAGNOSIS — Z8619 Personal history of other infectious and parasitic diseases: Secondary | ICD-10-CM | POA: Insufficient documentation

## 2014-08-09 DIAGNOSIS — Z3A01 Less than 8 weeks gestation of pregnancy: Secondary | ICD-10-CM | POA: Diagnosis not present

## 2014-08-09 DIAGNOSIS — O99281 Endocrine, nutritional and metabolic diseases complicating pregnancy, first trimester: Secondary | ICD-10-CM | POA: Insufficient documentation

## 2014-08-09 DIAGNOSIS — E860 Dehydration: Secondary | ICD-10-CM

## 2014-08-09 DIAGNOSIS — O26899 Other specified pregnancy related conditions, unspecified trimester: Secondary | ICD-10-CM

## 2014-08-09 DIAGNOSIS — R102 Pelvic and perineal pain unspecified side: Secondary | ICD-10-CM

## 2014-08-09 DIAGNOSIS — R55 Syncope and collapse: Secondary | ICD-10-CM | POA: Diagnosis not present

## 2014-08-09 DIAGNOSIS — R51 Headache: Secondary | ICD-10-CM | POA: Insufficient documentation

## 2014-08-09 DIAGNOSIS — R531 Weakness: Secondary | ICD-10-CM | POA: Diagnosis not present

## 2014-08-09 DIAGNOSIS — Z862 Personal history of diseases of the blood and blood-forming organs and certain disorders involving the immune mechanism: Secondary | ICD-10-CM | POA: Insufficient documentation

## 2014-08-09 DIAGNOSIS — O9989 Other specified diseases and conditions complicating pregnancy, childbirth and the puerperium: Secondary | ICD-10-CM | POA: Insufficient documentation

## 2014-08-09 DIAGNOSIS — Z3490 Encounter for supervision of normal pregnancy, unspecified, unspecified trimester: Secondary | ICD-10-CM

## 2014-08-09 LAB — POC URINE PREG, ED: Preg Test, Ur: POSITIVE — AB

## 2014-08-09 LAB — CBC
HEMATOCRIT: 38.3 % (ref 36.0–46.0)
HEMOGLOBIN: 12.1 g/dL (ref 12.0–15.0)
MCH: 26.5 pg (ref 26.0–34.0)
MCHC: 31.6 g/dL (ref 30.0–36.0)
MCV: 84 fL (ref 78.0–100.0)
Platelets: 244 10*3/uL (ref 150–400)
RBC: 4.56 MIL/uL (ref 3.87–5.11)
RDW: 14.2 % (ref 11.5–15.5)
WBC: 6.5 10*3/uL (ref 4.0–10.5)

## 2014-08-09 LAB — URINE MICROSCOPIC-ADD ON

## 2014-08-09 LAB — BASIC METABOLIC PANEL
Anion gap: 9 (ref 5–15)
BUN: 6 mg/dL (ref 6–20)
CO2: 23 mmol/L (ref 22–32)
Calcium: 9.2 mg/dL (ref 8.9–10.3)
Chloride: 102 mmol/L (ref 101–111)
Creatinine, Ser: 0.6 mg/dL (ref 0.44–1.00)
GFR calc Af Amer: >60 mL/min (ref >60)
GFR calc non Af Amer: >60 mL/min (ref >60)
GLUCOSE: 83 mg/dL (ref 65–99)
POTASSIUM: 4 mmol/L (ref 3.5–5.1)
Sodium: 134 mmol/L — ABNORMAL LOW (ref 135–145)

## 2014-08-09 LAB — HCG, QUANTITATIVE, PREGNANCY: hCG, Beta Chain, Quant, S: 85589 m[IU]/mL — ABNORMAL HIGH (ref <5)

## 2014-08-09 LAB — URINALYSIS, ROUTINE W REFLEX MICROSCOPIC
Bilirubin Urine: NEGATIVE
GLUCOSE, UA: NEGATIVE mg/dL
HGB URINE DIPSTICK: NEGATIVE
KETONES UR: NEGATIVE mg/dL
Nitrite: POSITIVE — AB
PROTEIN: NEGATIVE mg/dL
Specific Gravity, Urine: 1.025 (ref 1.005–1.030)
UROBILINOGEN UA: 1 mg/dL (ref 0.0–1.0)
pH: 6 (ref 5.0–8.0)

## 2014-08-09 MED ORDER — SODIUM CHLORIDE 0.9 % IV BOLUS (SEPSIS)
1000.0000 mL | Freq: Once | INTRAVENOUS | Status: AC
  Administered 2014-08-09: 1000 mL via INTRAVENOUS

## 2014-08-09 MED ORDER — NITROFURANTOIN MONOHYD MACRO 100 MG PO CAPS: 100.0000 mg | ORAL_CAPSULE | Freq: Two times a day (BID) | ORAL | Status: DC

## 2014-08-09 NOTE — ED Provider Notes (Signed)
CSN: [id number]     Arrival date & time 08/09/14  1257 History   First MD Initiated Contact with Patient 08/09/14 1514     Chief Complaint  Patient presents with  . Weakness     (Consider location/radiation/quality/duration/timing/severity/associated sxs/prior Treatment) The history is provided by the patient and medical records. No language interpreter was used.     Monique Gamble is a 24 y.o. female  [phone] with a hx of anemia presents to the Emergency Department complaining of gradual, persistent, progressively worsening weakness and fatigue onset 3 days ago.  Pt reports syncopal episode 2 days ago at a neighbors house.  Pt reports someone caught her and she did not hit her head.  Pt reports associated throbbing occipital headache rated at a 9/10.  Pt reports she has been taking ibuprofen for the headache with moderate relief.  Her last dose was 3 days ago. Pt also endorses intermittent vomiting with eating but no diarrhea or abdominal pain.   Pt denies fever, chills, neck pain, chest pain, SOB, peripheral edema, abdominal pain, vaginal bleeding, vaginal discharge, dysuria, hematuria.  LMP: Feb or March.  Pt reports she had a baby in January.  Pt denies use of birth control.  Pt with positive pregnancy test and upon questioning reports that she does know she is pregnancy.  She has had no prenatal care for this pregnancy.    Record review shows that 6 months ago patient had type and screen which showed O+ blood with negative antibodies.   Past Medical History  Diagnosis Date  . Chlamydia   . Headache   . History of anemia    Past Surgical History  Procedure Laterality Date  . Induced abortion     Family History  Problem Relation Age of Onset  . Anesthesia problems Neg Hx   . Hypertension Mother   . Diabetes Sister    History  Substance Use Topics  . Smoking status: Never Smoker   . Smokeless tobacco: Never Used  . Alcohol Use: No   OB History    Gravida Para Term  Preterm AB TAB SAB Ectopic Multiple Living   0 1 1 0 0 0 3     Review of Systems  Constitutional: Negative for fever, diaphoresis, appetite change, fatigue and unexpected weight change.  HENT: Negative for mouth sores.   Eyes: Negative for visual disturbance.  Respiratory: Negative for cough, chest tightness, shortness of breath and wheezing.   Cardiovascular: Negative for chest pain.  Gastrointestinal: Negative for nausea, vomiting, abdominal pain, diarrhea and constipation.  Endocrine: Negative for polydipsia, polyphagia and polyuria.  Genitourinary: Negative for dysuria, urgency, frequency and hematuria.  Musculoskeletal: Negative for back pain and neck stiffness.  Skin: Negative for rash.  Allergic/Immunologic: Negative for immunocompromised state.  Neurological: Positive for syncope, weakness, light-headedness and headaches.  Hematological: Does not bruise/bleed easily.  Psychiatric/Behavioral: Negative for sleep disturbance. The patient is not nervous/anxious.       Allergies  Review of patient's allergies indicates no known allergies.  Home Medications   Prior to Admission medications   Medication Sig Start Date End Date Taking? Authorizing Provider  cephALEXin (KEFLEX) 500 MG capsule Take 1 capsule (500 mg total) by mouth 2 (two) times daily. Patient not taking: Reported on 08/09/2014 07/20/14   Fayrene Helper, PA-C  nitrofurantoin, macrocrystal-monohydrate, (MACROBID) 100 MG capsule Take 1 capsule (100 mg total) by mouth 2 (two) times daily. X 7 days 08/09/14   Dahlia Client Zamira Hickam, PA-C  permethrin (  ELIMITE) 5 % cream Apply from neck to toes at night and leave it on for 8-10 hours.  Then washed off completely.  Repeat treatment in 1 week if no improvement. Patient not taking: Reported on 08/09/2014 07/20/14   Fayrene Helper, PA-C   BP 123/65 mmHg  Pulse 93  Temp(Src) 98.6 F (37 C) (Oral)  Resp 16  SpO2 99%  LMP 08/09/2014 Physical Exam  Constitutional: She is oriented to  person, place, and time. She appears well-developed and well-nourished. No distress.  Awake, alert, nontoxic appearance  HENT:  Head: Normocephalic and atraumatic.  Mouth/Throat: Oropharynx is clear and moist. No oropharyngeal exudate.  Eyes: Conjunctivae and EOM are normal. Pupils are equal, round, and reactive to light. No scleral icterus.  No horizontal, vertical or rotational nystagmus  Neck: Normal range of motion. Neck supple.  Full active and passive ROM without pain No midline or paraspinal tenderness No nuchal rigidity or meningeal signs  Cardiovascular: Normal rate, regular rhythm, normal heart sounds and intact distal pulses.   Pulmonary/Chest: Effort normal and breath sounds normal. No respiratory distress. She has no wheezes. She has no rales.  Equal chest expansion  Abdominal: Soft. Bowel sounds are normal. She exhibits no mass. There is no tenderness. There is no rebound and no guarding.  abd soft and nontender Palpable fundus just above the pubic symphysis  Musculoskeletal: Normal range of motion. She exhibits no edema.  Lymphadenopathy:    She has no cervical adenopathy.  Neurological: She is alert and oriented to person, place, and time. She has normal reflexes. No cranial nerve deficit. She exhibits normal muscle tone. Coordination normal.  Mental Status:  Alert, oriented, thought content appropriate. Speech fluent without evidence of aphasia. Able to follow 2 step commands without difficulty.  Cranial Nerves:  II:  Peripheral visual fields grossly normal, pupils equal, round, reactive to light III,IV, VI: ptosis not present, extra-ocular motions intact bilaterally  V,VII: smile symmetric, facial light touch sensation equal VIII: hearing grossly normal bilaterally  IX,X: gag reflex present  XI: bilateral shoulder shrug equal and strong XII: midline tongue extension  Motor:  5/5 in upper and lower extremities bilaterally including strong and equal grip strength and  dorsiflexion/plantar flexion Sensory: Pinprick and light touch normal in all extremities.  Deep Tendon Reflexes: 2+ and symmetric  Cerebellar: normal finger-to-nose with bilateral upper extremities Gait: normal gait and balance CV: distal pulses palpable throughout   Skin: Skin is warm and dry. No rash noted. She is not diaphoretic.  Psychiatric: She has a normal mood and affect. Her behavior is normal. Judgment and thought content normal.  Nursing note and vitals reviewed.   ED Course  Procedures (including critical care time) Labs Review Labs Reviewed  BASIC METABOLIC PANEL - Abnormal; Notable for the following:    Sodium 134 (*)    All other components within normal limits  URINALYSIS, ROUTINE W REFLEX MICROSCOPIC (NOT AT [facility]) - Abnormal; Notable for the following:    Color, Urine AMBER (*)    APPearance CLOUDY (*)    Nitrite POSITIVE (*)    Leukocytes, UA SMALL (*)    All other components within normal limits  HCG, QUANTITATIVE, PREGNANCY - Abnormal; Notable for the following:    hCG, Beta Chain, Quant, S 69629 (*)    All other components within normal limits  URINE MICROSCOPIC-ADD ON - Abnormal; Notable for the following:    Squamous Epithelial / LPF FEW (*)    Bacteria, UA MANY (*)  All other components within normal limits  POC URINE PREG, ED - Abnormal; Notable for the following:    Preg Test, Ur POSITIVE (*)    All other components within normal limits  URINE CULTURE  CBC    Imaging Review US Ob Limited  08/09/2014   CLINICAL DATA:  Pregnant patient with weakness, dizziness and headache. Initial encounter.  EXAM: LIMITED OBSTETRIC ULTRASOUND  FINDINGS: Number of Fetuses: 1  Heart Rate:  140 bpm  Movement: Yes  Presentation: Breech  Placental Location: Anterior  Previa: No  Amniotic Fluid (Subjective):  Within normal limits.  BPD:  2.75cm 14w  6d  MATERNAL FINDINGS:  Cervix:  Appears closed.  Uterus/Adnexae:  No abnormality visualized.  IMPRESSION: Single living  injury pregnancy.  No acute abnormality.  This exam is performed on an emergent basis and does not comprehensively evaluate fetal size, dating, or anatomy; follow-up complete OB US should be considered if further fetal assessment is warranted.   Electronically Signed   By: Drusilla Kanner M.D.   On: 08/09/2014 16:45     EKG Interpretation None      MDM   Final diagnoses:  Weakness  Pregnancy  Dehydration   Emeterio Reeve Broaddus presents with headache, lightheadedness, syncope and generalized weakness. Normal neurologic exam.  Patient without signs or symptoms of DVT, less likely to be a dural sinus thrombosis. Patient without orthostatic vital signs.  Will obtain lab work, give fluids and confirm intrauterine pregnancy.  Pt is resting comfortably at this time.    Orthostatic VS for the past 24 hrs:  BP- Lying Pulse- Lying BP- Sitting Pulse- Sitting BP- Standing at 0 minutes Pulse- Standing at 0 minutes  08/09/14 1552 117/67 mmHg 79 128/71 mmHg 83 135/74 mmHg 80    5:56 PM Pt reports she is feeling much better and symptoms of lightheadedness have resolved.  Labs and [place] are reassuring.  Pt with evidence of UTI.  She reports she did not complete the antibiotics last time she was given them on 07/20/14.  Headache has resolved completely. Patient remains without focal neurologic deficit.   6:39 PM Fluids completed. Patient angulatory. The emergency department without difficulty and with steady gait. No orthostasis initially. Patient discharged home with antibiotics. She continues to deny abdominal pain, vaginal bleeding or vaginal discharge. Patient is to follow-up with OB/GYN in 3 days.  BP 123/65 mmHg  Pulse 93  Temp(Src) 98.6 F (37 C) (Oral)  Resp 16  SpO2 99%  LMP 08/09/2014    Dierdre Forth, PA-C 08/09/14 1840  Pricilla Loveless, MD 08/14/14 1430

## 2014-08-09 NOTE — ED Notes (Signed)
POS POC PREG RN Molli Barrows notified

## 2014-08-09 NOTE — Discharge Instructions (Signed)
1. Medications: macrobid, usual home medications 2. Treatment: rest, drink plenty of fluids,  3. Follow Up: Please followup with your OB/GYN in 3 days for discussion of your diagnoses and further evaluation after today's visit; if you do not have a primary care doctor use the resource guide provided to find one; Please return to the ER for worsening symptoms, syncope, abdominal pain, vaginal bleeding or other concerns    Dehydration, Adult Dehydration is when you lose more fluids from the body than you take in. Vital organs like the kidneys, brain, and heart cannot function without a proper amount of fluids and salt. Any loss of fluids from the body can cause dehydration.  CAUSES   Vomiting.  Diarrhea.  Excessive sweating.  Excessive urine output.  Fever. SYMPTOMS  Mild dehydration  Thirst.  Dry lips.  Slightly dry mouth. Moderate dehydration  Very dry mouth.  Sunken eyes.  Skin does not bounce back quickly when lightly pinched and released.  Dark urine and decreased urine production.  Decreased tear production.  Headache. Severe dehydration  Very dry mouth.  Extreme thirst.  Rapid, weak pulse (more than 100 beats per minute at rest).  Cold hands and feet.  Not able to sweat in spite of heat and temperature.  Rapid breathing.  Blue lips.  Confusion and lethargy.  Difficulty being awakened.  Minimal urine production.  No tears. DIAGNOSIS  Your caregiver will diagnose dehydration based on your symptoms and your exam. Blood and urine tests will help confirm the diagnosis. The diagnostic evaluation should also identify the cause of dehydration. TREATMENT  Treatment of mild or moderate dehydration can often be done at home by increasing the amount of fluids that you drink. It is best to drink small amounts of fluid more often. Drinking too much at one time can make vomiting worse. Refer to the home care instructions below. Severe dehydration needs to be  treated at the hospital where you will probably be given intravenous (IV) fluids that contain water and electrolytes. HOME CARE INSTRUCTIONS   Ask your caregiver about specific rehydration instructions.  Drink enough fluids to keep your urine clear or pale yellow.  Drink small amounts frequently if you have nausea and vomiting.  Eat as you normally do.  Avoid:  Foods or drinks high in sugar.  Carbonated drinks.  Juice.  Extremely hot or cold fluids.  Drinks with caffeine.  Fatty, greasy foods.  Alcohol.  Tobacco.  Overeating.  Gelatin desserts.  Wash your hands well to avoid spreading bacteria and viruses.  Only take over-the-counter or prescription medicines for pain, discomfort, or fever as directed by your caregiver.  Ask your caregiver if you should continue all prescribed and over-the-counter medicines.  Keep all follow-up appointments with your caregiver. SEEK MEDICAL CARE IF:  You have abdominal pain and it increases or stays in one area (localizes).  You have a rash, stiff neck, or severe headache.  You are irritable, sleepy, or difficult to awaken.  You are weak, dizzy, or extremely thirsty. SEEK IMMEDIATE MEDICAL CARE IF:   You are unable to keep fluids down or you get worse despite treatment.  You have frequent episodes of vomiting or diarrhea.  You have blood or green matter (bile) in your vomit.  You have blood in your stool or your stool looks black and tarry.  You have not urinated in 6 to 8 hours, or you have only urinated a small amount of very dark urine.  You have a fever.  You  faint. MAKE SURE YOU:   Understand these instructions.  Will watch your condition.  Will get help right away if you are not doing well or get worse. Document Released: 02/02/2005 Document Revised: 04/27/2011 Document Reviewed: 09/22/2010 [facility] Patient Information 2015 [place], [place]. This information is not intended to replace advice given to you by  your health care provider. Make sure you discuss any questions you have with your health care provider.

## 2014-08-09 NOTE — ED Notes (Signed)
Per ems pt c/o generalized weakness and dizziness. Head pain 10/10. Pain increases with movement.

## 2014-08-12 LAB — URINE CULTURE: Culture: >=100000

## 2014-08-13 ENCOUNTER — Telehealth (HOSPITAL_COMMUNITY): Payer: Self-pay

## 2014-08-13 NOTE — Telephone Encounter (Signed)
Post ED Visit - Positive Culture Follow-up  Culture report reviewed by antimicrobial stewardship pharmacist: []  Wes Dulaney, Pharm.D., BCPS []  Celedonio Miyamoto, Pharm.D., BCPS []  Georgina Pillion, [address].D., BCPS []  [place], [address].D., BCPS, AAHIVP [x]  Estella Husk, Pharm.D., BCPS, AAHIVP []  Elder Cyphers, [address].D., BCPS  Positive urine culture Treated with nitrofurantion, organism sensitive to the same and no further patient follow-up is required at this time.  Ashley Jacobs 08/13/2014, 5:36 PM

## 2014-11-22 ENCOUNTER — Encounter (HOSPITAL_COMMUNITY): Payer: Self-pay | Admitting: *Deleted

## 2014-11-22 ENCOUNTER — Inpatient Hospital Stay (HOSPITAL_COMMUNITY)
Admission: AD | Admit: 2014-11-22 | Discharge: 2014-11-22 | Disposition: A | Discharge status: Home / Self Care | Payer: Medicaid Other | Source: Ambulatory Visit | Attending: Family Medicine | Admitting: Family Medicine

## 2014-11-22 DIAGNOSIS — N949 Unspecified condition associated with female genital organs and menstrual cycle: Secondary | ICD-10-CM

## 2014-11-22 DIAGNOSIS — R109 Unspecified abdominal pain: Secondary | ICD-10-CM | POA: Diagnosis present

## 2014-11-22 DIAGNOSIS — O26893 Other specified pregnancy related conditions, third trimester: Secondary | ICD-10-CM | POA: Diagnosis not present

## 2014-11-22 DIAGNOSIS — Z3A29 29 weeks gestation of pregnancy: Secondary | ICD-10-CM | POA: Insufficient documentation

## 2014-11-22 DIAGNOSIS — R102 Pelvic and perineal pain: Secondary | ICD-10-CM | POA: Insufficient documentation

## 2014-11-22 LAB — URINALYSIS, ROUTINE W REFLEX MICROSCOPIC
BILIRUBIN URINE: NEGATIVE
Glucose, UA: NEGATIVE mg/dL
Hgb urine dipstick: NEGATIVE
Ketones, ur: NEGATIVE mg/dL
NITRITE: NEGATIVE
PROTEIN: NEGATIVE mg/dL
SPECIFIC GRAVITY, URINE: 1.02 (ref 1.005–1.030)
Urobilinogen, UA: 2 mg/dL — ABNORMAL HIGH (ref 0.0–1.0)
pH: 7 (ref 5.0–8.0)

## 2014-11-22 LAB — URINE MICROSCOPIC-ADD ON

## 2014-11-22 NOTE — Discharge Instructions (Signed)

## 2014-11-22 NOTE — MAU Provider Note (Signed)
History     CSN: [id number]  Arrival date and time: 11/22/14 1148   First Provider Initiated Contact with Patient 11/22/14 1249      Chief Complaint  Patient presents with  . Leg Pain   HPI [name] is a 24 y.o. [phone] at [redacted]w[redacted]d who presents to MAU today with complaint of bilateral lower abdominal and groin pain. The patient states that pain is only present with ambulation and standing for long periods of time. She states pain is 10/10 with movement only. She has no pain now while resting. She states pain has been present x 1 week. She has tried Tylenol without relief. She has not tried warm bath/shower or abdominal binder. She denies vaginal bleeding, discharge or LOF. She states occasional, very mild contractions. She reports good fetal movement. She plans to go to [facility] for prenatal care but has had issues with Medicaid.   OB History    Gravida Para Term Preterm AB TAB SAB Ectopic Multiple Living   0 1 1 0 0 0 3      Past Medical History  Diagnosis Date  . Chlamydia   . Headache   . History of anemia     Past Surgical History  Procedure Laterality Date  . Induced abortion      Family History  Problem Relation Age of Onset  . Anesthesia problems Neg Hx   . Hypertension Mother   . Diabetes Sister     Social History  Substance Use Topics  . Smoking status: Never Smoker   . Smokeless tobacco: Never Used  . Alcohol Use: No    Allergies: No Known Allergies  Prescriptions prior to admission  Medication Sig Dispense Refill Last Dose  . cephALEXin (KEFLEX) 500 MG capsule Take 1 capsule (500 mg total) by mouth 2 (two) times daily. (Patient not taking: Reported on 08/09/2014) 40 capsule 0 Not Taking at Unknown time  . nitrofurantoin, macrocrystal-monohydrate, (MACROBID) 100 MG capsule Take 1 capsule (100 mg total) by mouth 2 (two) times daily. X 7 days 14 capsule 0   . permethrin (ELIMITE) 5 % cream Apply from neck to toes at night and  leave it on for 8-10 hours.  Then washed off completely.  Repeat treatment in 1 week if no improvement. (Patient not taking: Reported on 08/09/2014) 60 g 2 Not Taking at Unknown time    Review of Systems  Constitutional: Negative for fever and malaise/fatigue.  Gastrointestinal: Positive for abdominal pain. Negative for nausea, vomiting, diarrhea and constipation.  Genitourinary: Negative for dysuria, urgency and frequency.       Neg - vaginal bleeding, discharge, LOF  Musculoskeletal: Positive for myalgias.   Physical Exam   Blood pressure 119/69, pulse 87, temperature 97.9 F (36.6 C), temperature source Oral, resp. rate 16, height  (1.676 m), weight 181 lb 12.8 oz (82.464 kg), last menstrual period 08/09/2014, SpO2 100 %, unknown if currently breastfeeding.  Physical Exam  Nursing note and vitals reviewed. Constitutional: She is oriented to person, place, and time. She appears well-developed and well-nourished. No distress.  HENT:  Head: Normocephalic and atraumatic.  Cardiovascular: Normal rate and intact distal pulses.   Respiratory: Effort normal.  GI: Soft. She exhibits no distension and no mass. There is no tenderness. There is no rebound and no guarding.  Musculoskeletal: She exhibits no edema.  Neurological: She is alert and oriented to person, place, and time.  Skin: Skin is warm and dry. No  erythema.  Psychiatric: She has a normal mood and affect.    Results for orders placed or performed during the hospital encounter of 11/22/14 (from the past 24 hour(s))  Urinalysis, Routine w reflex microscopic (not at [facility])     Status: Abnormal   Collection Time: 11/22/14 12:05 PM  Result Value Ref Range   Color, Urine YELLOW YELLOW   APPearance CLEAR CLEAR   Specific Gravity, Urine 1.020 1.005 - 1.030   pH 7.0 5.0 - 8.0   Glucose, UA NEGATIVE NEGATIVE mg/dL   Hgb urine dipstick NEGATIVE NEGATIVE   Bilirubin Urine NEGATIVE NEGATIVE   Ketones, ur NEGATIVE NEGATIVE mg/dL    Protein, ur NEGATIVE NEGATIVE mg/dL   Urobilinogen, UA 2.0 (H) 0.0 - 1.0 mg/dL   Nitrite NEGATIVE NEGATIVE   Leukocytes, UA SMALL (A) NEGATIVE  Urine microscopic-add on     Status: Abnormal   Collection Time: 11/22/14 12:05 PM  Result Value Ref Range   Squamous Epithelial / LPF MANY (A) RARE   WBC, UA 3-6 <3 WBC/hpf   RBC / HPF 0-2 <3 RBC/hpf   Bacteria, UA MANY (A) RARE   Fetal Monitoring: Baseline: 120 bpm, moderate variability, + accelerations, no decelerations Contractions: none  MAU Course  Procedures None  MDM UA today Urine culture ordered  Assessment and Plan  A: SIUP at [redacted]w[redacted]d Round ligament pain  P: Discharge home Tylenol PRN advised to pain Discussed use of abdominal binder and warm bath/shower for relief of round ligament pain Preterm labor precautions discussed Patient advised to follow-up with [facility] as planned to start prenatal care ASAP Patient may return to MAU as needed or if her condition were to change or worsen   Marny Lowenstein, PA-C  11/22/2014, 1:06 PM

## 2014-11-22 NOTE — MAU Note (Signed)
Patients states she has been having pain in her thighs then she gets up from sitting or if she has been walking for long periods of time.  Denies vaginal bleeding or LOF.  Reports +fetal movement.  Has not received prenatal care.  Had pregnancy verified at [facility] ED and planned to go to [facility].

## 2014-11-24 LAB — CULTURE, OB URINE

## 2014-11-25 ENCOUNTER — Encounter: Payer: Self-pay | Admitting: Advanced Practice Midwife

## 2014-11-25 ENCOUNTER — Telehealth: Payer: Self-pay | Admitting: Advanced Practice Midwife

## 2014-11-25 MED ORDER — CEPHALEXIN 500 MG PO CAPS: 500.0000 mg | ORAL_CAPSULE | Freq: Three times a day (TID) | ORAL | Status: DC

## 2014-11-25 NOTE — Telephone Encounter (Signed)
Urine culture + e coli, pansensitive, discussed w/ patient, rx sent for Keflex to pharmacy, pyelo precautions rev'd. Pt is planning on getting care w/ [facility], but she is 30+ weeks pregnant at this time. Advised that if she cannot get care at [facility], can call [facility] for follow up, gave number for clinic.

## 2014-12-19 ENCOUNTER — Encounter: Payer: Self-pay | Admitting: Advanced Practice Midwife

## 2014-12-19 ENCOUNTER — Ambulatory Visit (INDEPENDENT_AMBULATORY_CARE_PROVIDER_SITE_OTHER): Payer: Medicaid Other | Admitting: Advanced Practice Midwife

## 2014-12-19 ENCOUNTER — Other Ambulatory Visit (HOSPITAL_COMMUNITY)
Admission: RE | Admit: 2014-12-19 | Discharge: 2014-12-19 | Disposition: A | Discharge status: Home / Self Care | Payer: Medicaid Other | Source: Ambulatory Visit | Attending: Advanced Practice Midwife | Admitting: Advanced Practice Midwife

## 2014-12-19 VITALS — BP 137/80 | HR 99 | Temp 98.3°F | Wt 181.9 lb

## 2014-12-19 DIAGNOSIS — Z23 Encounter for immunization: Secondary | ICD-10-CM | POA: Diagnosis not present

## 2014-12-19 DIAGNOSIS — Z124 Encounter for screening for malignant neoplasm of cervix: Secondary | ICD-10-CM

## 2014-12-19 DIAGNOSIS — Z113 Encounter for screening for infections with a predominantly sexual mode of transmission: Secondary | ICD-10-CM | POA: Diagnosis present

## 2014-12-19 DIAGNOSIS — Z3483 Encounter for supervision of other normal pregnancy, third trimester: Secondary | ICD-10-CM

## 2014-12-19 DIAGNOSIS — Z01419 Encounter for gynecological examination (general) (routine) without abnormal findings: Secondary | ICD-10-CM | POA: Insufficient documentation

## 2014-12-19 DIAGNOSIS — O26893 Other specified pregnancy related conditions, third trimester: Secondary | ICD-10-CM

## 2014-12-19 DIAGNOSIS — R519 Headache, unspecified: Secondary | ICD-10-CM | POA: Insufficient documentation

## 2014-12-19 DIAGNOSIS — R51 Headache: Secondary | ICD-10-CM | POA: Diagnosis not present

## 2014-12-19 DIAGNOSIS — O219 Vomiting of pregnancy, unspecified: Secondary | ICD-10-CM | POA: Insufficient documentation

## 2014-12-19 DIAGNOSIS — O26899 Other specified pregnancy related conditions, unspecified trimester: Secondary | ICD-10-CM | POA: Insufficient documentation

## 2014-12-19 DIAGNOSIS — O0990 Supervision of high risk pregnancy, unspecified, unspecified trimester: Secondary | ICD-10-CM | POA: Insufficient documentation

## 2014-12-19 DIAGNOSIS — Z3493 Encounter for supervision of normal pregnancy, unspecified, third trimester: Secondary | ICD-10-CM

## 2014-12-19 LAB — CBC
HCT: 29 % — ABNORMAL LOW (ref 36.0–46.0)
HEMOGLOBIN: 9.4 g/dL — AB (ref 12.0–15.0)
MCH: 25.1 pg — AB (ref 26.0–34.0)
MCHC: 32.4 g/dL (ref 30.0–36.0)
MCV: 77.5 fL — ABNORMAL LOW (ref 78.0–100.0)
MPV: 9.2 fL (ref 8.6–12.4)
PLATELETS: 221 10*3/uL (ref 150–400)
RBC: 3.74 MIL/uL — ABNORMAL LOW (ref 3.87–5.11)
RDW: 14.6 % (ref 11.5–15.5)
WBC: 10.3 10*3/uL (ref 4.0–10.5)

## 2014-12-19 LAB — POCT URINALYSIS DIP (DEVICE)
GLUCOSE, UA: NEGATIVE mg/dL
HGB URINE DIPSTICK: NEGATIVE
Ketones, ur: NEGATIVE mg/dL
NITRITE: NEGATIVE
Protein, ur: 30 mg/dL — AB
SPECIFIC GRAVITY, URINE: 1.02 (ref 1.005–1.030)
UROBILINOGEN UA: 1 mg/dL (ref 0.0–1.0)
pH: 7 (ref 5.0–8.0)

## 2014-12-19 LAB — COMPREHENSIVE METABOLIC PANEL
ALBUMIN: 3 g/dL — AB (ref 3.6–5.1)
ALK PHOS: 125 U/L — AB (ref 33–115)
ALT: 5 U/L — AB (ref 6–29)
AST: 14 U/L (ref 10–30)
BUN: 4 mg/dL — AB (ref 7–25)
CHLORIDE: 104 mmol/L (ref 98–110)
CO2: 21 mmol/L (ref 20–31)
CREATININE: 0.49 mg/dL — AB (ref 0.50–1.10)
Calcium: 8.2 mg/dL — ABNORMAL LOW (ref 8.6–10.2)
Glucose, Bld: 94 mg/dL (ref 65–99)
Potassium: 3.6 mmol/L (ref 3.5–5.3)
SODIUM: 135 mmol/L (ref 135–146)
TOTAL PROTEIN: 6 g/dL — AB (ref 6.1–8.1)
Total Bilirubin: 0.7 mg/dL (ref 0.2–1.2)

## 2014-12-19 LAB — OB RESULTS CONSOLE GC/CHLAMYDIA: GC PROBE AMP, GENITAL: NEGATIVE

## 2014-12-19 MED ORDER — TETANUS-DIPHTH-ACELL PERTUSSIS 5-2.5-18.5 LF-MCG/0.5 IM SUSP
0.5000 mL | Freq: Once | INTRAMUSCULAR | Status: AC
  Administered 2014-12-19: 0.5 mL via INTRAMUSCULAR

## 2014-12-19 MED ORDER — BUTALBITAL-APAP-CAFFEINE 50-500-40 MG PO TABS: 1.0000 | ORAL_TABLET | ORAL | Status: DC | PRN

## 2014-12-19 MED ORDER — ACETAMINOPHEN 325 MG PO TABS
650.0000 mg | ORAL_TABLET | Freq: Once | ORAL | Status: AC
  Administered 2014-12-19: 650 mg via ORAL

## 2014-12-19 MED ORDER — PROMETHAZINE HCL 25 MG PO TABS: 12.5000 mg | ORAL_TABLET | Freq: Four times a day (QID) | ORAL | Status: DC | PRN

## 2014-12-19 MED ORDER — RANITIDINE HCL 150 MG PO TABS: 150.0000 mg | ORAL_TABLET | Freq: Two times a day (BID) | ORAL | Status: DC

## 2014-12-19 NOTE — Progress Notes (Signed)
Subjective:    Monique Gamble is a [id number] [redacted]w[redacted]d being seen today for her first obstetrical visit.  Her obstetrical history is significant for Term SVD x 3. Pt reports daily h/a, not resolved with tylenol. Denies visual disturbances or epigastric pain.  Reports soreness/weakness in her upper thighs.  Also reports daily nausea and admits to smoking pot to resolve this so she can eat. She would like to try some medicines for nausea. Phenergan helped her with her last pregnancy.  Pregnancy history fully reviewed.  Patient reports fatigue, headache and heartburn.  Filed Vitals:   12/19/14 0923  BP: 137/80  Pulse: 99  Temp: 98.3 F (36.8 C)  Weight: 82.509 kg (181 lb 14.4 oz)    HISTORY: OB History  Gravida Para Term Preterm AB SAB TAB Ectopic Multiple Living  0 1 0 1 0 0 3    # Outcome Date GA Lbr Len/2nd Weight Sex Delivery Anes PTL Lv  5 Gravida           4 Term 03/15/14 [redacted]w[redacted]d 03:15 / 00:02 3.195 kg (7 lb 0.7 oz) M Vag-Spont None  Y  3 Term 08/15/11 [redacted]w[redacted]d 01:38 / 00:14 3.065 kg (6 lb 12.1 oz) F Vag-Spont None  Y  2 Term 2009 [redacted]w[redacted]d  2.977 kg (6 lb 9 oz) F Vag-Spont   Y  1 TAB 2005             Past Medical History  Diagnosis Date  . Chlamydia   . Headache   . History of anemia   . ADHD (attention deficit hyperactivity disorder)    Past Surgical History  Procedure Laterality Date  . Induced abortion     Family History  Problem Relation Age of Onset  . Anesthesia problems Neg Hx   . Hypertension Mother   . Diabetes Sister      Exam    Uterus:  Fundal Height: 34 cm  Pelvic Exam:    Perineum: No Hemorrhoids, Normal Perineum   Vulva: normal   Vagina:  normal mucosa, normal discharge   pH:    Cervix: multiparous appearance, no bleeding following Pap and no cervical motion tenderness   Adnexa: normal adnexa   Bony Pelvis: average  System: Breast:  normal appearance, no masses or tenderness   Skin: normal coloration and turgor, no rashes    Neurologic:  oriented, normal, gait normal; reflexes normal and symmetric   Extremities: normal strength, tone, and muscle mass   HEENT sclera clear, anicteric   Mouth/Teeth mucous membranes moist, pharynx normal without lesions and dental hygiene good   Neck supple and no masses   Cardiovascular: regular rate and rhythm   Respiratory:  appears well, vitals normal, no respiratory distress, acyanotic, normal RR, ear and throat exam is normal, neck free of mass or lymphadenopathy   Abdomen: soft, non-tender; bowel sounds normal; no masses,  no organomegaly   Urinary: urethral meatus normal      Assessment:    Pregnancy: [id number]  1. Supervision of normal pregnancy in third trimester  - Prenatal Profile - Glucose Tolerance, 1 HR (50g) w/o Fasting - Hemoglobinopathy evaluation - Prescript Monitor Profile(19) - Tdap (BOOSTRIX) injection 0.5 mL; Inject 0.5 mLs into the muscle once. - [place] MFM OB COMP + 14 WK; Future - GC/Chlamydia probe amp ([place])not at [facility] - Cytology - PAP  2. Headache in pregnancy, third trimester  -- Fioricet Rx --Protein / creatinine ratio, urine - CBC - Comprehensive metabolic panel  3. Nausea and vomiting during pregnancy --Phenergan Rx       Plan:     Initial labs drawn. Prenatal vitamins. Warm bath, heating pad Ok for leg soreness Discussed risks of smoking/smoking pot in pregnancy with pt Problem list reviewed and updated. Genetic Screening discussed : late to care.  Ultrasound discussed; fetal survey: ordered.  Follow up in 2 weeks.    Monique Gamble, Monique Gamble 12/19/2014

## 2014-12-19 NOTE — Progress Notes (Signed)
Here for initial prenatal visit. Given new pregnancy education booklets.  C/o braxton hicks when lying down - mostly at night. Declines flu shot. C/o headaches occasionally, today=9.

## 2014-12-20 LAB — PRENATAL PROFILE (SOLSTAS)
Antibody Screen: NEGATIVE
BASOS ABS: 0 10*3/uL (ref 0.0–0.1)
Basophils Relative: 0 % (ref 0–1)
EOS PCT: 2 % (ref 0–5)
Eosinophils Absolute: 0.2 10*3/uL (ref 0.0–0.7)
HCT: 29 % — ABNORMAL LOW (ref 36.0–46.0)
HIV: NONREACTIVE
Hemoglobin: 9.4 g/dL — ABNORMAL LOW (ref 12.0–15.0)
Hepatitis B Surface Ag: NEGATIVE
LYMPHS ABS: 2.9 10*3/uL (ref 0.7–4.0)
LYMPHS PCT: 28 % (ref 12–46)
MCH: 25.1 pg — AB (ref 26.0–34.0)
MCHC: 32.4 g/dL (ref 30.0–36.0)
MCV: 77.5 fL — AB (ref 78.0–100.0)
MONO ABS: 0.9 10*3/uL (ref 0.1–1.0)
MPV: 9.2 fL (ref 8.6–12.4)
Monocytes Relative: 9 % (ref 3–12)
Neutro Abs: 6.3 10*3/uL (ref 1.7–7.7)
Neutrophils Relative %: 61 % (ref 43–77)
Platelets: 221 10*3/uL (ref 150–400)
RBC: 3.74 MIL/uL — ABNORMAL LOW (ref 3.87–5.11)
RDW: 14.6 % (ref 11.5–15.5)
RUBELLA: 0.49 {index} (ref <0.90)
Rh Type: POSITIVE
WBC: 10.3 10*3/uL (ref 4.0–10.5)

## 2014-12-20 LAB — PROTEIN / CREATININE RATIO, URINE
Creatinine, Urine: 242 mg/dL (ref 20–320)
Protein Creatinine Ratio: 186 mg/g creat — ABNORMAL HIGH (ref 21–161)
TOTAL PROTEIN, URINE: 45 mg/dL — AB (ref 5–24)

## 2014-12-20 LAB — GLUCOSE TOLERANCE, 1 HOUR (50G) W/O FASTING: Glucose, 1 Hour GTT: 97 mg/dL (ref 70–140)

## 2014-12-21 LAB — GC/CHLAMYDIA PROBE AMP (~~LOC~~) NOT AT ARMC
CHLAMYDIA, DNA PROBE: NEGATIVE
Neisseria Gonorrhea: NEGATIVE

## 2014-12-21 LAB — HEMOGLOBINOPATHY EVALUATION
Hemoglobin Other: 0 %
Hgb A2 Quant: 2.6 % (ref 2.2–3.2)
Hgb A: 97.4 % (ref 96.8–97.8)
Hgb F Quant: 0 % (ref 0.0–2.0)
Hgb S Quant: 0 %

## 2014-12-24 ENCOUNTER — Encounter (HOSPITAL_COMMUNITY): Payer: Self-pay | Admitting: Advanced Practice Midwife

## 2014-12-24 ENCOUNTER — Other Ambulatory Visit: Payer: Self-pay | Admitting: Advanced Practice Midwife

## 2014-12-24 ENCOUNTER — Ambulatory Visit (HOSPITAL_COMMUNITY)
Admission: RE | Admit: 2014-12-24 | Discharge: 2014-12-24 | Disposition: A | Discharge status: Home / Self Care | Payer: Medicaid Other | Source: Ambulatory Visit | Attending: Advanced Practice Midwife | Admitting: Advanced Practice Midwife

## 2014-12-24 DIAGNOSIS — Z3A34 34 weeks gestation of pregnancy: Secondary | ICD-10-CM | POA: Diagnosis not present

## 2014-12-24 DIAGNOSIS — O0933 Supervision of pregnancy with insufficient antenatal care, third trimester: Secondary | ICD-10-CM

## 2014-12-24 DIAGNOSIS — Z36 Encounter for antenatal screening of mother: Secondary | ICD-10-CM | POA: Diagnosis not present

## 2014-12-24 DIAGNOSIS — O0932 Supervision of pregnancy with insufficient antenatal care, second trimester: Secondary | ICD-10-CM | POA: Insufficient documentation

## 2014-12-24 DIAGNOSIS — Z3689 Encounter for other specified antenatal screening: Secondary | ICD-10-CM

## 2014-12-24 DIAGNOSIS — Z3493 Encounter for supervision of normal pregnancy, unspecified, third trimester: Secondary | ICD-10-CM

## 2014-12-24 LAB — CANNABANOIDS (GC/LC/MS), URINE: THC-COOH (GC/LC/MS), ur confirm: 176 ng/mL — AB (ref <5)

## 2014-12-24 LAB — CYTOLOGY - PAP

## 2014-12-25 LAB — PRESCRIPTION MONITORING PROFILE (19 PANEL)
Amphetamine/Meth: NEGATIVE ng/mL
BUPRENORPHINE, URINE: NEGATIVE ng/mL
Barbiturate Screen, Urine: NEGATIVE ng/mL
Benzodiazepine Screen, Urine: NEGATIVE ng/mL
COCAINE METABOLITES: NEGATIVE ng/mL
CREATININE, URINE: 241.49 mg/dL (ref >20.0)
Carisoprodol, Urine: NEGATIVE ng/mL
ECSTASY: NEGATIVE ng/mL
FENTANYL URINE: NEGATIVE ng/mL
MEPERIDINE UR: NEGATIVE ng/mL
Methadone Screen, Urine: NEGATIVE ng/mL
Methaqualone: NEGATIVE ng/mL
NITRITES URINE, INITIAL: NEGATIVE ug/mL
OXYCODONE SCRN UR: NEGATIVE ng/mL
Opiate Screen, Urine: NEGATIVE ng/mL
PH URINE, INITIAL: 7.5 pH (ref 4.5–8.9)
PHENCYCLIDINE, UR: NEGATIVE ng/mL
Propoxyphene: NEGATIVE ng/mL
Tapentadol, urine: NEGATIVE ng/mL
Tramadol Scrn, Ur: NEGATIVE ng/mL
Zolpidem, Urine: NEGATIVE ng/mL

## 2015-01-02 ENCOUNTER — Encounter: Payer: Medicaid Other | Admitting: Advanced Practice Midwife

## 2015-01-08 ENCOUNTER — Encounter: Payer: Medicaid Other | Admitting: Family Medicine

## 2015-01-09 ENCOUNTER — Encounter: Payer: Medicaid Other | Admitting: Advanced Practice Midwife

## 2015-01-15 ENCOUNTER — Ambulatory Visit (INDEPENDENT_AMBULATORY_CARE_PROVIDER_SITE_OTHER): Payer: Medicaid Other | Admitting: Student

## 2015-01-15 ENCOUNTER — Other Ambulatory Visit (HOSPITAL_COMMUNITY): Payer: Self-pay | Admitting: Obstetrics and Gynecology

## 2015-01-15 ENCOUNTER — Ambulatory Visit (HOSPITAL_COMMUNITY)
Admission: RE | Admit: 2015-01-15 | Discharge: 2015-01-15 | Disposition: A | Discharge status: Home / Self Care | Payer: Medicaid Other | Source: Ambulatory Visit | Attending: Obstetrics | Admitting: Obstetrics

## 2015-01-15 ENCOUNTER — Encounter (HOSPITAL_COMMUNITY): Payer: Self-pay

## 2015-01-15 ENCOUNTER — Other Ambulatory Visit (HOSPITAL_COMMUNITY)
Admission: RE | Admit: 2015-01-15 | Discharge: 2015-01-15 | Disposition: A | Discharge status: Home / Self Care | Payer: Medicaid Other | Source: Ambulatory Visit | Attending: Student | Admitting: Student

## 2015-01-15 ENCOUNTER — Encounter: Payer: Self-pay | Admitting: *Deleted

## 2015-01-15 VITALS — BP 132/82 | HR 96 | Temp 98.4°F | Wt 188.4 lb

## 2015-01-15 DIAGNOSIS — Z3A37 37 weeks gestation of pregnancy: Secondary | ICD-10-CM | POA: Insufficient documentation

## 2015-01-15 DIAGNOSIS — Z113 Encounter for screening for infections with a predominantly sexual mode of transmission: Secondary | ICD-10-CM

## 2015-01-15 DIAGNOSIS — O0933 Supervision of pregnancy with insufficient antenatal care, third trimester: Secondary | ICD-10-CM | POA: Diagnosis not present

## 2015-01-15 DIAGNOSIS — B373 Candidiasis of vulva and vagina: Secondary | ICD-10-CM

## 2015-01-15 DIAGNOSIS — O98813 Other maternal infectious and parasitic diseases complicating pregnancy, third trimester: Secondary | ICD-10-CM

## 2015-01-15 DIAGNOSIS — Z1389 Encounter for screening for other disorder: Secondary | ICD-10-CM

## 2015-01-15 DIAGNOSIS — Z3483 Encounter for supervision of other normal pregnancy, third trimester: Secondary | ICD-10-CM

## 2015-01-15 DIAGNOSIS — Z36 Encounter for antenatal screening of mother: Secondary | ICD-10-CM | POA: Insufficient documentation

## 2015-01-15 DIAGNOSIS — D649 Anemia, unspecified: Secondary | ICD-10-CM

## 2015-01-15 DIAGNOSIS — O99013 Anemia complicating pregnancy, third trimester: Secondary | ICD-10-CM

## 2015-01-15 DIAGNOSIS — B3731 Acute candidiasis of vulva and vagina: Secondary | ICD-10-CM

## 2015-01-15 LAB — POCT URINALYSIS DIP (DEVICE)
BILIRUBIN URINE: NEGATIVE
GLUCOSE, UA: NEGATIVE mg/dL
Hgb urine dipstick: NEGATIVE
NITRITE: NEGATIVE
Protein, ur: 30 mg/dL — AB
Specific Gravity, Urine: 1.025 (ref 1.005–1.030)
Urobilinogen, UA: 1 mg/dL (ref 0.0–1.0)
pH: 7 (ref 5.0–8.0)

## 2015-01-15 LAB — WET PREP, GENITAL
CLUE CELLS WET PREP: NONE SEEN
TRICH WET PREP: NONE SEEN
YEAST WET PREP: NONE SEEN

## 2015-01-15 LAB — OB RESULTS CONSOLE GBS: GBS: NEGATIVE

## 2015-01-15 MED ORDER — FERROUS SULFATE 325 (65 FE) MG PO TABS: 325.0000 mg | ORAL_TABLET | Freq: Every day | ORAL | Status: DC

## 2015-01-15 MED ORDER — TERCONAZOLE 0.8 % VA CREA: 1.0000 | TOPICAL_CREAM | Freq: Every day | VAGINAL | Status: DC

## 2015-01-15 NOTE — Progress Notes (Signed)
Subjective:  Monique Gamble is a 24 y.o. [phone] at 951w4d being seen today for ongoing prenatal care.  She is currently monitored for the following issues for this low-risk pregnancy and has Supervision of normal subsequent pregnancy; Headache in pregnancy; and Nausea and vomiting during pregnancy on her problem list.  Patient reports vaginal discharge.  Contractions: Irritability. Vag. Bleeding: None.  Movement: Present. Denies leaking of fluid.  Thick white vaginal discharge x 1 weeks. States she thinks she has a yeast infection.   The following portions of the patient's history were reviewed and updated as appropriate: allergies, current medications, past family history, past medical history, past social history, past surgical history and problem list. Problem list updated.  Objective:   Filed Vitals:   01/15/15 1425  BP: 132/82  Pulse: 96  Temp: 98.4 F (36.9 C)  Weight: 188 lb 6.4 oz (85.458 kg)    Fetal Status: Fetal Heart Rate (bpm): 130   Movement: Present     General:  Alert, oriented and cooperative. Patient is in no acute distress.  Skin: Skin is warm and dry. No rash noted.   Cardiovascular: Normal heart rate noted  Respiratory: Normal respiratory effort, no problems with respiration noted  Abdomen: Soft, gravid, appropriate for gestational age. Pain/Pressure: Absent     Pelvic: Vag. Bleeding: None Vag D/C Character: White   Cervical exam performed         Dilation: 2 Effacement (%): 50 Station: -3 Presentation: Vertex   Extremities: Normal range of motion.  Edema: None  Mental Status: Normal mood and affect. Normal behavior. Normal judgment and thought content.   Urinalysis: Urine Protein: Trace Urine Glucose: Negative  Assessment and Plan:  Pregnancy: [id number] at 1851w4d  1. Encounter for supervision of other normal pregnancy in third trimester  - Culture, beta strep (group b only) - Urine cytology ancillary only  2. Anemia in pregnancy, third  trimester  - ferrous sulfate (FERROUSUL) 325 (65 FE) MG tablet; Take 1 tablet (325 mg total) by mouth daily with breakfast.  Dispense: 30 tablet; Refill: 3  3. Vaginal yeast infection  - terconazole (TERAZOL 3) 0.8 % vaginal cream; Place 1 applicator vaginally at bedtime.  Dispense: 20 g; Refill: 0 - Wet prep, genital   Term labor symptoms and general obstetric precautions including but not limited to vaginal bleeding, contractions, leaking of fluid and fetal movement were reviewed in detail with the patient. Please refer to After Visit Summary for other counseling recommendations.  Return in about 1 week (around 01/22/2015).   Judeth HornErin Coty Student, NP

## 2015-01-15 NOTE — Progress Notes (Signed)
Urine small amt wbcs, trace ketones C/o white itchy discharge Pt request btl informed consent Plans to formula feed Cultures today

## 2015-01-15 NOTE — Patient Instructions (Signed)
Laparoscopic Tubal Ligation Laparoscopic tubal ligation is a procedure that closes the fallopian tubes at a time other than right after childbirth. When the fallopian tubes are closed, the eggs that are released from the ovaries cannot enter the uterus, and sperm cannot reach the egg. Tubal ligation is also known as getting your "tubes tied." Tubal ligation is done so you will not be able to get pregnant or have a baby. Although this procedure may be undone (reversed), it should be considered permanent and irreversible. If you want to have future pregnancies, you should not have this procedure. LET [facility] CARE PROVIDER KNOW ABOUT:  Any allergies you have.  All medicines you are taking, including vitamins, herbs, eye drops, creams, and over-the-counter medicines. This includes any use of steroids, either by mouth or in cream form.  Previous problems you or members of your family have had with the use of anesthetics.  Any blood disorders you have.  Previous surgeries you have had.  Any medical conditions you may have.  Possibility of pregnancy, if this applies.  Any past pregnancies. RISKS AND COMPLICATIONS  Infection.  Bleeding.  Injury to surrounding organs.  Side effects from anesthetics.  Failure of the procedure.  Ectopic pregnancy.  Future regret about having the procedure done. BEFORE THE PROCEDURE  Ask your health care provider about:  Changing or stopping your regular medicines. This is especially important if you are taking diabetes medicines or blood thinners.  Taking medicines such as aspirin and ibuprofen. These medicines can thin your blood. Do not take these medicines before your procedure if your health care provider instructs you not to.  Follow instructions from your health care provider about eating and drinking restrictions.  Plan to have someone take you home after the procedure.  If you go home right after the procedure, plan to have someone  with you for 24 hours. PROCEDURE  You will be given one or more of the following:  A medicine that helps you relax (sedative).  A medicine that numbs the area (local anesthetic).  A medicine that makes you fall asleep (general anesthetic).  A medicine that is injected into an area of your body that numbs everything below the injection site (regional anesthetic).  If you have been given general anesthetic, a tube will be put down your throat to help you breathe.  Two small cuts (incisions) will be made in the lower abdominal area and near the belly button.  Your bladder may be emptied with a small tube (catheter).  Your abdomen will be inflated with a safe gas (carbon dioxide). This will help to give the surgeon room to operate and visualize, and it will help the surgeon to avoid other organs.  A thin, lighted tube (laparoscope) with a camera attached will be inserted into your abdomen through one of the incisions near the belly button. Other small instruments will be inserted through the other abdominal incision.  The fallopian tubes will be tied off or burned (cauterized), or they will be blocked with a clip, ring, or clamp. In many cases, a small portion in the center of each fallopian tube will also be removed.  After the fallopian tubes are blocked, the gas will be released from the abdomen.  The incisions will be closed with stitches (sutures).  A bandage (dressing) will be placed over the incisions. The procedure may vary among health care providers and hospitals. AFTER THE PROCEDURE  Your blood pressure, heart rate, breathing rate, and blood oxygen level  will be monitored often until the medicines you were given have worn off.  You will be given pain medicine as needed.  If you had general anesthetic, you may have some mild discomfort in your throat. This is from the breathing tube that was placed in your throat while you were sleeping.  You may experience discomfort in  the shoulder area from some trapped air between your liver and your diaphragm. This sensation is normal, and it will slowly go away on its own.  You will have some mild abdominal discomfort for 3--7 days.   This information is not intended to replace advice given to you by your health care provider. Make sure you discuss any questions you have with your health care provider.   Document Released: 05/11/2000 Document Revised: 06/19/2014 Document Reviewed: 05/16/2011 Elsevier Interactive Patient Education 2016 Elsevier Inc[employer] Fetal Movement Counts Patient Name: __________________________________________________ Patient Due Date: ____________________ Performing a fetal movement count is highly recommended in high-risk pregnancies, but it is good for every pregnant woman to do. Your health care provider may ask you to start counting fetal movements at 28 weeks of the pregnancy. Fetal movements often increase:  After eating a full meal.  After physical activity.  After eating or drinking something sweet or cold.  At rest. Pay attention to when you feel the baby is most active. This will help you notice a pattern of your baby's sleep and wake cycles and what factors contribute to an increase in fetal movement. It is important to perform a fetal movement count at the same time each day when your baby is normally most active.  HOW TO COUNT FETAL MOVEMENTS  Find a quiet and comfortable area to sit or lie down on your left side. Lying on your left side provides the best blood and oxygen circulation to your baby.  Write down the day and time on a sheet of paper or in a journal.  Start counting kicks, flutters, swishes, rolls, or jabs in a 2-hour period. You should feel at least 10 movements within 2 hours.  If you do not feel 10 movements in 2 hours, wait 2-3 hours and count again. Look for a change in the pattern or not enough counts in 2 hours. SEEK MEDICAL CARE IF:  You feel less than 10 counts  in 2 hours, tried twice.  There is no movement in over an hour.  The pattern is changing or taking longer each day to reach 10 counts in 2 hours.  You feel the baby is not moving as he or she usually does. Date: ____________ Movements: ____________ Start time: ____________ Doreatha Martin time: ____________  Date: ____________ Movements: ____________ Start time: ____________ Doreatha Martin time: ____________ Date: ____________ Movements: ____________ Start time: ____________ Doreatha Martin time: ____________ Date: ____________ Movements: ____________ Start time: ____________ Doreatha Martin time: ____________ Date: ____________ Movements: ____________ Start time: ____________ Doreatha Martin time: ____________ Date: ____________ Movements: ____________ Start time: ____________ Doreatha Martin time: ____________ Date: ____________ Movements: ____________ Start time: ____________ Doreatha Martin time: ____________ Date: ____________ Movements: ____________ Start time: ____________ Doreatha Martin time: ____________  Date: ____________ Movements: ____________ Start time: ____________ Doreatha Martin time: ____________ Date: ____________ Movements: ____________ Start time: ____________ Doreatha Martin time: ____________ Date: ____________ Movements: ____________ Start time: ____________ Doreatha Martin time: ____________ Date: ____________ Movements: ____________ Start time: ____________ Doreatha Martin time: ____________ Date: ____________ Movements: ____________ Start time: ____________ Doreatha Martin time: ____________ Date: ____________ Movements: ____________ Start time: ____________ Doreatha Martin time: ____________ Date: ____________ Movements: ____________ Start time: ____________ Doreatha Martin time: ____________  Date: ____________ Movements: ____________ Start  time: ____________ Doreatha MartinFinish time: ____________ Date: ____________ Movements: ____________ Start time: ____________ Doreatha MartinFinish time: ____________ Date: ____________ Movements: ____________ Start time: ____________ Doreatha MartinFinish time: ____________ Date: ____________  Movements: ____________ Start time: ____________ Doreatha MartinFinish time: ____________ Date: ____________ Movements: ____________ Start time: ____________ Doreatha MartinFinish time: ____________ Date: ____________ Movements: ____________ Start time: ____________ Doreatha MartinFinish time: ____________ Date: ____________ Movements: ____________ Start time: ____________ Doreatha MartinFinish time: ____________  Date: ____________ Movements: ____________ Start time: ____________ Doreatha MartinFinish time: ____________ Date: ____________ Movements: ____________ Start time: ____________ Doreatha MartinFinish time: ____________ Date: ____________ Movements: ____________ Start time: ____________ Doreatha MartinFinish time: ____________ Date: ____________ Movements: ____________ Start time: ____________ Doreatha MartinFinish time: ____________ Date: ____________ Movements: ____________ Start time: ____________ Doreatha MartinFinish time: ____________ Date: ____________ Movements: ____________ Start time: ____________ Doreatha MartinFinish time: ____________ Date: ____________ Movements: ____________ Start time: ____________ Doreatha MartinFinish time: ____________  Date: ____________ Movements: ____________ Start time: ____________ Doreatha MartinFinish time: ____________ Date: ____________ Movements: ____________ Start time: ____________ Doreatha MartinFinish time: ____________ Date: ____________ Movements: ____________ Start time: ____________ Doreatha MartinFinish time: ____________ Date: ____________ Movements: ____________ Start time: ____________ Doreatha MartinFinish time: ____________ Date: ____________ Movements: ____________ Start time: ____________ Doreatha MartinFinish time: ____________ Date: ____________ Movements: ____________ Start time: ____________ Doreatha MartinFinish time: ____________ Date: ____________ Movements: ____________ Start time: ____________ Doreatha MartinFinish time: ____________  Date: ____________ Movements: ____________ Start time: ____________ Doreatha MartinFinish time: ____________ Date: ____________ Movements: ____________ Start time: ____________ Doreatha MartinFinish time: ____________ Date: ____________ Movements: ____________ Start time:  ____________ Doreatha MartinFinish time: ____________ Date: ____________ Movements: ____________ Start time: ____________ Doreatha MartinFinish time: ____________ Date: ____________ Movements: ____________ Start time: ____________ Doreatha MartinFinish time: ____________ Date: ____________ Movements: ____________ Start time: ____________ Doreatha MartinFinish time: ____________ Date: ____________ Movements: ____________ Start time: ____________ Doreatha MartinFinish time: ____________  Date: ____________ Movements: ____________ Start time: ____________ Doreatha MartinFinish time: ____________ Date: ____________ Movements: ____________ Start time: ____________ Doreatha MartinFinish time: ____________ Date: ____________ Movements: ____________ Start time: ____________ Doreatha MartinFinish time: ____________ Date: ____________ Movements: ____________ Start time: ____________ Doreatha MartinFinish time: ____________ Date: ____________ Movements: ____________ Start time: ____________ Doreatha MartinFinish time: ____________ Date: ____________ Movements: ____________ Start time: ____________ Doreatha MartinFinish time: ____________ Date: ____________ Movements: ____________ Start time: ____________ Doreatha MartinFinish time: ____________  Date: ____________ Movements: ____________ Start time: ____________ Doreatha MartinFinish time: ____________ Date: ____________ Movements: ____________ Start time: ____________ Doreatha MartinFinish time: ____________ Date: ____________ Movements: ____________ Start time: ____________ Doreatha MartinFinish time: ____________ Date: ____________ Movements: ____________ Start time: ____________ Doreatha MartinFinish time: ____________ Date: ____________ Movements: ____________ Start time: ____________ Doreatha MartinFinish time: ____________ Date: ____________ Movements: ____________ Start time: ____________ Doreatha MartinFinish time: ____________   This information is not intended to replace advice given to you by your health care provider. Make sure you discuss any questions you have with your health care provider.   Document Released: 03/04/2006 Document Revised: 02/23/2014 Document Reviewed: 11/30/2011 Elsevier Interactive  Patient Education 2016 Elsevier Inc. Ball CorporationBraxton Hicks Contractions Contractions of the uterus can occur throughout pregnancy. Contractions are not always a sign that you are in labor.  WHAT ARE BRAXTON HICKS CONTRACTIONS?  Contractions that occur before labor are called Braxton Hicks contractions, or false labor. Toward the end of pregnancy (32-34 weeks), these contractions can develop more often and may become more forceful. This is not true labor because these contractions do not result in opening (dilatation) and thinning of the cervix. They are sometimes difficult to tell apart from true labor because these contractions can be forceful and people have different pain tolerances. You should not feel embarrassed if you go to the hospital with false labor. Sometimes, the only way to tell if you are in true labor is for your health care provider to look for changes in  the cervix. If there are no prenatal problems or other health problems associated with the pregnancy, it is completely safe to be sent home with false labor and await the onset of true labor. HOW CAN YOU TELL THE DIFFERENCE BETWEEN TRUE AND FALSE LABOR? False Labor  The contractions of false labor are usually shorter and not as hard as those of true labor.   The contractions are usually irregular.   The contractions are often felt in the front of the lower abdomen and in the groin.   The contractions may go away when you walk around or change positions while lying down.   The contractions get weaker and are shorter lasting as time goes on.   The contractions do not usually become progressively stronger, regular, and closer together as with true labor.  True Labor  Contractions in true labor last 30-70 seconds, become very regular, usually become more intense, and increase in frequency.   The contractions do not go away with walking.   The discomfort is usually felt in the top of the uterus and spreads to the lower abdomen  and low back.   True labor can be determined by your health care provider with an exam. This will show that the cervix is dilating and getting thinner.  WHAT TO REMEMBER  Keep up with your usual exercises and follow other instructions given by your health care provider.   Take medicines as directed by your health care provider.   Keep your regular prenatal appointments.   Eat and drink lightly if you think you are going into labor.   If Braxton Hicks contractions are making you uncomfortable:   Change your position from lying down or resting to walking, or from walking to resting.   Sit and rest in a tub of warm water.   Drink 2-3 glasses of water. Dehydration may cause these contractions.   Do slow and deep breathing several times an hour.  WHEN SHOULD I SEEK IMMEDIATE MEDICAL CARE? Seek immediate medical care if:  Your contractions become stronger, more regular, and closer together.   You have fluid leaking or gushing from your vagina.   You have a fever.   You pass blood-tinged mucus.   You have vaginal bleeding.   You have continuous abdominal pain.   You have low back pain that you never had before.   You feel your baby's head pushing down and causing pelvic pressure.   Your baby is not moving as much as it used to.    This information is not intended to replace advice given to you by your health care provider. Make sure you discuss any questions you have with your health care provider.   Document Released: 02/02/2005 Document Revised: 02/07/2013 Document Reviewed: 11/14/2012 Elsevier Interactive Patient Education [employer]

## 2015-01-16 LAB — URINE CYTOLOGY ANCILLARY ONLY
Chlamydia: NEGATIVE
NEISSERIA GONORRHEA: NEGATIVE

## 2015-01-17 LAB — CULTURE, BETA STREP (GROUP B ONLY)

## 2015-01-21 ENCOUNTER — Inpatient Hospital Stay (HOSPITAL_COMMUNITY)
Admission: AD | Admit: 2015-01-21 | Discharge: 2015-01-22 | DRG: 767 | Disposition: A | Discharge status: Home / Self Care | Payer: Medicaid Other | Source: Ambulatory Visit | Attending: Obstetrics & Gynecology | Admitting: Obstetrics & Gynecology

## 2015-01-21 ENCOUNTER — Encounter (HOSPITAL_COMMUNITY): Payer: Self-pay | Admitting: *Deleted

## 2015-01-21 DIAGNOSIS — IMO0001 Reserved for inherently not codable concepts without codable children: Secondary | ICD-10-CM

## 2015-01-21 DIAGNOSIS — Z302 Encounter for sterilization: Secondary | ICD-10-CM

## 2015-01-21 DIAGNOSIS — Z3A38 38 weeks gestation of pregnancy: Secondary | ICD-10-CM

## 2015-01-21 DIAGNOSIS — Z8249 Family history of ischemic heart disease and other diseases of the circulatory system: Secondary | ICD-10-CM | POA: Diagnosis not present

## 2015-01-21 DIAGNOSIS — Z833 Family history of diabetes mellitus: Secondary | ICD-10-CM

## 2015-01-21 LAB — CBC
HEMATOCRIT: 25 % — AB (ref 36.0–46.0)
HEMATOCRIT: 32.6 % — AB (ref 36.0–46.0)
HEMOGLOBIN: 10.1 g/dL — AB (ref 12.0–15.0)
Hemoglobin: 7.8 g/dL — ABNORMAL LOW (ref 12.0–15.0)
MCH: 24.2 pg — ABNORMAL LOW (ref 26.0–34.0)
MCH: 24.3 pg — ABNORMAL LOW (ref 26.0–34.0)
MCHC: 31 g/dL (ref 30.0–36.0)
MCHC: 31.2 g/dL (ref 30.0–36.0)
MCV: 77.9 fL — AB (ref 78.0–100.0)
MCV: 78.2 fL (ref 78.0–100.0)
PLATELETS: 284 10*3/uL (ref 150–400)
Platelets: 247 10*3/uL (ref 150–400)
RBC: 3.21 MIL/uL — ABNORMAL LOW (ref 3.87–5.11)
RBC: 4.17 MIL/uL (ref 3.87–5.11)
RDW: 15.6 % — AB (ref 11.5–15.5)
RDW: 15.8 % — AB (ref 11.5–15.5)
WBC: 11.4 10*3/uL — AB (ref 4.0–10.5)
WBC: 16.2 10*3/uL — ABNORMAL HIGH (ref 4.0–10.5)

## 2015-01-21 LAB — TYPE AND SCREEN
ABO/RH(D): O POS
ANTIBODY SCREEN: NEGATIVE

## 2015-01-21 LAB — RPR: RPR Ser Ql: NONREACTIVE

## 2015-01-21 MED ORDER — CITRIC ACID-SODIUM CITRATE 334-500 MG/5ML PO SOLN
30.0000 mL | ORAL | Status: DC | PRN
Start: 2015-01-21 — End: 2015-01-21

## 2015-01-21 MED ORDER — WITCH HAZEL-GLYCERIN EX PADS: 1.0000 "application " | MEDICATED_PAD | CUTANEOUS | Status: DC | PRN

## 2015-01-21 MED ORDER — LACTATED RINGERS IV SOLN
INTRAVENOUS | Status: DC
  Administered 2015-01-21: 01:00:00 via INTRAVENOUS

## 2015-01-21 MED ORDER — OXYCODONE-ACETAMINOPHEN 5-325 MG PO TABS: 1.0000 | ORAL_TABLET | ORAL | Status: DC | PRN

## 2015-01-21 MED ORDER — TETANUS-DIPHTH-ACELL PERTUSSIS 5-2.5-18.5 LF-MCG/0.5 IM SUSP: 0.5000 mL | Freq: Once | INTRAMUSCULAR | Status: DC

## 2015-01-21 MED ORDER — ZOLPIDEM TARTRATE 5 MG PO TABS: 5.0000 mg | ORAL_TABLET | Freq: Every evening | ORAL | Status: DC | PRN

## 2015-01-21 MED ORDER — BENZOCAINE-MENTHOL 20-0.5 % EX AERO: 1.0000 "application " | INHALATION_SPRAY | CUTANEOUS | Status: DC | PRN

## 2015-01-21 MED ORDER — DIPHENHYDRAMINE HCL 25 MG PO CAPS: 25.0000 mg | ORAL_CAPSULE | Freq: Four times a day (QID) | ORAL | Status: DC | PRN

## 2015-01-21 MED ORDER — ONDANSETRON HCL 4 MG/2ML IJ SOLN: 4.0000 mg | INTRAMUSCULAR | Status: DC | PRN

## 2015-01-21 MED ORDER — LACTATED RINGERS IV SOLN: 500.0000 mL | INTRAVENOUS | Status: DC | PRN

## 2015-01-21 MED ORDER — FERROUS SULFATE 325 (65 FE) MG PO TABS
325.0000 mg | ORAL_TABLET | Freq: Two times a day (BID) | ORAL | Status: DC
  Administered 2015-01-22: 325 mg via ORAL
  Filled 2015-01-21: qty 1

## 2015-01-21 MED ORDER — OXYTOCIN BOLUS FROM INFUSION
500.0000 mL | INTRAVENOUS | Status: DC
  Administered 2015-01-21: 500 mL via INTRAVENOUS

## 2015-01-21 MED ORDER — OXYCODONE-ACETAMINOPHEN 5-325 MG PO TABS: 2.0000 | ORAL_TABLET | ORAL | Status: DC | PRN

## 2015-01-21 MED ORDER — ONDANSETRON HCL 4 MG PO TABS: 4.0000 mg | ORAL_TABLET | ORAL | Status: DC | PRN

## 2015-01-21 MED ORDER — LIDOCAINE HCL (PF) 1 % IJ SOLN
INTRAMUSCULAR | Status: AC
  Filled 2015-01-21: qty 30

## 2015-01-21 MED ORDER — FENTANYL CITRATE (PF) 100 MCG/2ML IJ SOLN
50.0000 ug | INTRAMUSCULAR | Status: DC | PRN
  Administered 2015-01-21: 50 ug via INTRAVENOUS
  Filled 2015-01-21: qty 2

## 2015-01-21 MED ORDER — OXYTOCIN 40 UNITS IN LACTATED RINGERS INFUSION - SIMPLE MED
INTRAVENOUS | Status: AC
  Filled 2015-01-21: qty 1000

## 2015-01-21 MED ORDER — IBUPROFEN 600 MG PO TABS
600.0000 mg | ORAL_TABLET | Freq: Four times a day (QID) | ORAL | Status: DC
  Administered 2015-01-21 – 2015-01-22 (×6): 600 mg via ORAL
  Filled 2015-01-21 (×5): qty 1

## 2015-01-21 MED ORDER — OXYTOCIN 40 UNITS IN LACTATED RINGERS INFUSION - SIMPLE MED: 62.5000 mL/h | INTRAVENOUS | Status: DC

## 2015-01-21 MED ORDER — ACETAMINOPHEN 325 MG PO TABS: 650.0000 mg | ORAL_TABLET | ORAL | Status: DC | PRN

## 2015-01-21 MED ORDER — PRENATAL MULTIVITAMIN CH
1.0000 | ORAL_TABLET | Freq: Every day | ORAL | Status: DC
  Administered 2015-01-21: 1 via ORAL

## 2015-01-21 MED ORDER — DIBUCAINE 1 % RE OINT: 1.0000 "application " | TOPICAL_OINTMENT | RECTAL | Status: DC | PRN

## 2015-01-21 MED ORDER — ONDANSETRON HCL 4 MG/2ML IJ SOLN
4.0000 mg | Freq: Four times a day (QID) | INTRAMUSCULAR | Status: DC | PRN
Start: 2015-01-21 — End: 2015-01-21

## 2015-01-21 MED ORDER — SENNOSIDES-DOCUSATE SODIUM 8.6-50 MG PO TABS: 2.0000 | ORAL_TABLET | ORAL | Status: DC

## 2015-01-21 MED ORDER — LIDOCAINE HCL (PF) 1 % IJ SOLN
30.0000 mL | INTRAMUSCULAR | Status: DC | PRN
  Filled 2015-01-21: qty 30

## 2015-01-21 MED ORDER — ACETAMINOPHEN 325 MG PO TABS
650.0000 mg | ORAL_TABLET | ORAL | Status: DC | PRN
  Administered 2015-01-21 (×2): 650 mg via ORAL
  Filled 2015-01-21 (×2): qty 2

## 2015-01-21 MED ORDER — SIMETHICONE 80 MG PO CHEW
80.0000 mg | CHEWABLE_TABLET | ORAL | Status: DC | PRN
  Administered 2015-01-21: 80 mg via ORAL
  Filled 2015-01-21: qty 1

## 2015-01-21 MED ORDER — LANOLIN HYDROUS EX OINT: TOPICAL_OINTMENT | CUTANEOUS | Status: DC | PRN

## 2015-01-21 NOTE — H&P (Signed)
LABOR ADMISSION HISTORY AND PHYSICAL  Rene PaciQuendra L Golliday is a 24 y.o. female [phone] with IUP at 151w3d presenting for active labor with SROM @ 0015. She reports +FM, + contractions, no blurry vision, headaches or peripheral edema, and RUQ pain.  She plans on bottle feeding. She request BTL for birth control.  Dating: By 14 week u/s --->  Estimated Date of Delivery: 02/01/15  Sono:    @37  w, CWD, normal anatomy, Cepahlic presentation, 2795 g, 35% EFW   [facility]Complications[facility]  Dating 14 week US Blood type: --/--/O POS (01/28 1420)   Genetic Screen 1 Screen:    AFP:     Quad:     NIPS: Antibody:NEG (01/28 1420)  Anatomic US wnl except artifact vs anterior fossa abnormality, repeat US in 3 weeks Rubella: 0.44 (12/25 2340)  GTT    Third trimester: 97 RPR: Non Reactive (01/28 1420)   Flu vaccine  HBsAg: NEGATIVE (12/25 2340)   TDaP vaccine       12/19/14                                   HIV: NONREACTIVE (12/25 2340)   Baby Food                                               AVW:[id number]:Negative (12/28 0000)(For PCN allergy, check sensitivities)  Contraception  BTL - papers signed 01/15/15 Pap:  Circumcision    Pediatrician    Support Person      Past Medical History: Past Medical History  Diagnosis Date  . Chlamydia   . Headache   . History of anemia   . ADHD (attention deficit hyperactivity disorder)     Past Surgical History: Past Surgical History  Procedure Laterality Date  . Induced abortion      Obstetrical History: OB History    Gravida Para Term Preterm AB TAB SAB Ectopic Multiple Living   5 3 3  0 1 1 0 0 0 3      Social History: Social History   Social History  . Marital Status: Single    Spouse Name: N/A  . Number of Children: N/A  . Years of Education: N/A   Social History Main Topics  . Smoking status: Never Smoker   . Smokeless tobacco: Never Used  . Alcohol Use: No  . Drug Use: Yes    Special: Marijuana     Comment:  occasionally to help appetite  . Sexual Activity: Not Currently    Birth Control/ Protection: None     Comment: Intercourse last night   Other Topics Concern  . None   Social History Narrative    Family History: Family History  Problem Relation Age of Onset  . Anesthesia problems Neg Hx   . Hypertension Mother   . Diabetes Sister     Allergies: No Known Allergies  Prescriptions prior to admission  Medication Sig Dispense Refill Last Dose  . butalbital-acetaminophen-caffeine (ESGIC PLUS) 50-500-40 MG tablet Take 1-2 tablets by mouth every 4 (four) hours as needed for pain or headache. (Patient not taking: Reported on 01/15/2015) 30 tablet 0 Not Taking  . butalbital-acetaminophen-caffeine (FIORICET, ESGIC) 50-325-40 MG tablet take 1-2 tablets by mouth every 4 hours if needed for pain  0   .  cephALEXin (KEFLEX) 500 MG capsule Take 1 capsule (500 mg total) by mouth 3 (three) times daily. (Patient not taking: Reported on 01/15/2015) 21 capsule 0 Not Taking  . ferrous sulfate (FERROUSUL) 325 (65 FE) MG tablet Take 1 tablet (325 mg total) by mouth daily with breakfast. 30 tablet 3   . Prenatal Multivit-Min-Fe-FA (PRENATAL VITAMINS PO) Take 2 tablets by mouth daily. Taking over the counter gummies from [facility]   Taking  . promethazine (PHENERGAN) 25 MG tablet Take 0.5-1 tablets (12.5-25 mg total) by mouth every 6 (six) hours as needed. 30 tablet 2 Taking  . ranitidine (ZANTAC) 150 MG tablet Take 1 tablet (150 mg total) by mouth 2 (two) times daily. 60 tablet 2 Taking  . terconazole (TERAZOL 3) 0.8 % vaginal cream Place 1 applicator vaginally at bedtime. 20 g 0      Review of Systems   All systems reviewed and negative except as stated in HPI  Ht  (1.676 m)  Wt 188 lb (85.276 kg)  BMI 30.36 kg/m2  LMP 04/27/2014 General appearance: alert and cooperative Lungs: clear to auscultation bilaterally Heart: regular rate and rhythm Abdomen: soft, non-tender; bowel sounds  normal Extremities: Homans sign is negative, no sign of DVT, edema Presentation: cephalic per nursing  Fetal monitoringBaseline: 120 bpm, Variability: Good {> 6 bpm), Accelerations: Reactive and Decelerations: Absent Uterine activity: Patient endorse contractions every 2 minutes   Prenatal labs: ABO, Rh: O/POS/-- (11/02 1048) Antibody: NEG (11/02 1048) Rubella: !Error! RPR: NON REAC (11/02 1048)  HBsAg: NEGATIVE (11/02 1048)  HIV: NONREACTIVE (11/02 1048)  GBS: Negative (11/29 0000)  1 hr Glucola 97 Genetic screening  None  Anatomy US wnl except artifact vs anterior fossa abnormality, repeat US in 3 weeks  Prenatal Transfer Tool  Maternal Diabetes: No Genetic Screening: Declined Maternal Ultrasounds/Referrals: Normal Fetal Ultrasounds or other Referrals:  None Maternal Substance Abuse:  No Significant Maternal Medications:  None Significant Maternal Lab Results: None  No results found for this or any previous visit (from the past 24 hour(s)).  Patient Active Problem List   Diagnosis Date Noted  . Supervision of normal subsequent pregnancy 12/19/2014  . Headache in pregnancy 12/19/2014  . Nausea and vomiting during pregnancy 12/19/2014    Assessment: ANNETTA DEISS is a 24 y.o. [phone] at [redacted]w[redacted]d here for active labor   #Labor: Expectant  #Pain: Fentanyl, declines epidural  #FWB: Category 1  #ID:  GBS negative  #MOF:  Bottle #MOC: BTL  #Circ:  Outpatient vs. Inpatient   Noralee Chars, MD

## 2015-01-22 ENCOUNTER — Encounter: Payer: Medicaid Other | Admitting: Advanced Practice Midwife

## 2015-01-22 DIAGNOSIS — IMO0001 Reserved for inherently not codable concepts without codable children: Secondary | ICD-10-CM

## 2015-01-22 LAB — CBC
HEMATOCRIT: 22.7 % — AB (ref 36.0–46.0)
Hemoglobin: 7.1 g/dL — ABNORMAL LOW (ref 12.0–15.0)
MCH: 24.2 pg — AB (ref 26.0–34.0)
MCHC: 31.3 g/dL (ref 30.0–36.0)
MCV: 77.5 fL — AB (ref 78.0–100.0)
Platelets: 216 10*3/uL (ref 150–400)
RBC: 2.93 MIL/uL — ABNORMAL LOW (ref 3.87–5.11)
RDW: 15.7 % — AB (ref 11.5–15.5)
WBC: 11 10*3/uL — ABNORMAL HIGH (ref 4.0–10.5)

## 2015-01-22 MED ORDER — MEASLES, MUMPS & RUBELLA VAC ~~LOC~~ INJ
0.5000 mL | INJECTION | Freq: Once | SUBCUTANEOUS | Status: AC
  Administered 2015-01-22: 0.5 mL via SUBCUTANEOUS
  Filled 2015-01-22 (×2): qty 0.5

## 2015-01-22 MED ORDER — FERROUS SULFATE 325 (65 FE) MG PO TABS: 325.0000 mg | ORAL_TABLET | Freq: Two times a day (BID) | ORAL | Status: DC

## 2015-01-22 MED ORDER — IBUPROFEN 600 MG PO TABS: 600.0000 mg | ORAL_TABLET | Freq: Four times a day (QID) | ORAL | Status: DC

## 2015-01-22 NOTE — Discharge Summary (Signed)
OB Discharge Summary     Patient Name: Monique PaciQuendra L Gamble DOB: [birth year] MRN: [id number]  Date of admission: 01/21/2015 Delivering MD:     Date of discharge: 01/22/2015  Admitting diagnosis: 39 WEEKS CTX Intrauterine pregnancy: 4626w3d     Secondary diagnosis:  Principal Problem:   Status post vaginal delivery Active Problems:   Active labor at term  Additional problems: None      Discharge diagnosis: Term Pregnancy Delivered                                                                                                Post partum procedures:None   Augmentation: None  Complications: None  Hospital course:  Onset of Labor With Vaginal Delivery     24 y.o. yo [id number] at 2126w3d was admitted in Active Laboron 01/21/2015. Patient had an uncomplicated labor course as follows:  Membrane Rupture Time/Date: 12:25 AM ,01/21/2015   Intrapartum Procedures: Episiotomy: None [1]                                         Lacerations:  None [1]  Patient had a delivery of a Viable infant. 01/21/2015  Information for the patient's newborn:  Monique Gamble, Boy Kaimana [[id number]][[id number]]  Delivery Method: Vaginal, Spontaneous Delivery (Filed from Delivery Summary)   Pateint had an uncomplicated postpartum course.  Patient with postpartum hemorrhage, with total EBL of 800 cc. Patient's hgb 10.3> 7.8 >7.1. She is ambulating, tolerating a regular diet, passing flatus, and urinating well. Patient is discharged home in stable condition on 01/22/2015.    Physical exam  Filed Vitals:   01/21/15 0830 01/21/15 1530 01/21/15 1910 01/22/15 0557  BP: 124/81 133/73 126/74 133/85  Pulse: 87 92 80 79  Temp: 98.6 F (37 C) 98.5 F (36.9 C) 97.5 F (36.4 C) 98.8 F (37.1 C)  TempSrc: Oral Oral Oral Oral  Resp: 16 18 18 18   Height:      Weight:      SpO2:   100%    General: alert, cooperative and no distress Lochia: appropriate Uterine Fundus: firm Incision: N/A DVT Evaluation: No evidence of DVT seen on  physical exam. Labs: Lab Results  Component Value Date   WBC 11.0* 01/22/2015   HGB 7.1* 01/22/2015   HCT 22.7* 01/22/2015   MCV 77.5* 01/22/2015   PLT 216 01/22/2015   CMP Latest Ref Rng 12/19/2014  Glucose 65 - 99 mg/dL 94  BUN 7 - 25 mg/dL 4(L)  Creatinine 6.960.50 - 1.10 mg/dL 2.95(M0.49(L)  Sodium 841135 - 324146 mmol/L 135  Potassium 3.5 - 5.3 mmol/L 3.6  Chloride 98 - 110 mmol/L 104  CO2 20 - 31 mmol/L 21  Calcium 8.6 - 10.2 mg/dL 8.2(L)  Total Protein 6.1 - 8.1 g/dL 6.0(L)  Total Bilirubin 0.2 - 1.2 mg/dL 0.7  Alkaline Phos 33 - 115 U/L 125(H)  AST 10 - 30 U/L 14  ALT 6 - 29 U/L 5(L)    Discharge instruction: per After Visit Summary and "Baby  and Me Booklet".  After visit meds:    Medication List    ASK your doctor about these medications        PRENATAL VITAMINS PO  Take 2 tablets by mouth daily. Taking over the counter gummies from [facility]        Diet: routine diet  Activity: Advance as tolerated. Pelvic rest for 6 weeks.   Outpatient follow up:6 weeks and 2 weeks for BTL scheduling Follow up Appt: Needs to be scheduled Postpartum contraception: Tubal Ligation  Newborn Data: Live born female  Birth Weight: 6 lb 3 oz (2807 g) APGAR: 8, 9  Baby Feeding: Bottle Disposition:home with mother   01/22/2015 Danella Maiers, MD  OB fellow attestation I have seen and examined this patient and agree with above documentation in the resident's note.   JAMIKA SADEK is a 24 y.o. [id number] s/p NSVD.   Pain is well controlled.  Plan for birth control is bilateral tubal ligation.  Method of Feeding: Formula  PE:  BP 133/85 mmHg  Pulse 79  Temp(Src) 98.8 F (37.1 C) (Oral)  Resp 18  Ht  (1.676 m)  Wt 188 lb (85.276 kg)  BMI 30.36 kg/m2  SpO2 100%  LMP 04/27/2014  Breastfeeding? Unknown Gen: well appearing Heart: reg rate Lungs: normal WOB Fundus firm Ext: soft, no pain, no edema   Recent Labs  01/21/15 0556 01/22/15 0535  HGB 7.8* 7.1*  HCT 25.0* 22.7*    Plan: discharge today - postpartum care discussed - f/u clinic in 6 weeks for postpartum visit - f/u in 2 weeks for BTL planning  Federico Flake, MD 9:03 AM

## 2015-01-22 NOTE — Discharge Instructions (Signed)

## 2015-01-22 NOTE — Clinical Social Work Maternal (Signed)
CLINICAL SOCIAL WORK MATERNAL/CHILD NOTE  Patient Details  Name: Jameeka L Ignasiak MRN: [id number] Date of Birth: 08/19/1990  Date:  01/22/2015  Clinical Social Worker Initiating Note:  Telina Kleckley MSW, LCSW Date/ Time Initiated:  01/22/15/0930     Child's Name:  Ken'sir    Legal Guardian:  Mother   Need for Interpreter:  None   Date of Referral:  01/21/15     Reason for Referral:  Late or No Prenatal Care , Current Substance Use/Substance Use During Pregnancy    Referral Source:  Central Nursery   Address:  [address] [place], [place] [address]  Phone number:  [phone]   Household Members:  Minor Children   Natural Supports (not living in the home):  Immediate Family, Extended Family, Spouse/significant other   Professional Supports: None   Employment: Homemaker   Type of Work:   N/A  Education:    N/A  Financial Resources:  SSI/Disability, Medicaid   Other Resources:    WIC, Food Stamps  Cultural/Religious Considerations Which May Impact Care:  None reported  Strengths:  Ability to meet basic needs , Pediatrician chosen , Home prepared for child    Risk Factors/Current Problems:   1)Mental Health Concerns: [name] reported history of depression since age 4. She stated that she experienced depressive symptoms during the pregnancy as she coped with news of the pregnancy and caring for her three other children.  2)Substance Use: [name] reported THC use "now and then" to assist with nausea and depressive symptoms. Infant's UDS is negative, and MDS is pending. 3)Late prenatal care: [name] reported barriers to transportation during the pregnancy, but stated that she now has a bus pass.     Cognitive State:  Able to Concentrate , Alert , Goal Oriented , Linear Thinking    Mood/Affect:  Happy , Comfortable , Calm    CSW Assessment:  CSW received request for consult due to [name] presenting with late prenatal care and a history of THC use during the pregnancy.  [name] provided consent for CSW to engage in presence of FOB. FOB was sleeping during the entire assessment, and did not participate. [name] presented as easily engaged and receptive to the visit. She openly answered questions, and was in a pleasant mood. Mood and affect appropriate to the setting and the situation.   [name] openly discussed her thoughts and feelings when she first learned that she was pregnant. Per [name], there is a short interval between pregnancies, and she reported feeling angry and frustrated since she still felt that she had a "newborn" when she learned that she was pregnant.  Per [name], she has three other children (ages 7, 3, the 11 month old), and discussed her thoughts and feelings associated with caring for her children. CSW provided supportive listening and assisted her to process her feelings.  [name] stated that she spoke to her mother and other members of her support system which assisted her to feel capable of being able to care for her children. [name] reported that her family served as a source of motivation, and she discussed how with time she came to become excited about the infant.  [name] stated that she is "glad" that she "kept the baby", and is grateful for her support system.  [name] spoke about some of the challenges of asking for help, including her "pride", but recognized the importance of the benefits of asking for help. She stated that she knows that asking for help will allow her to engage in self-care   activities, and expressed intention to ask and accept help postpartum.  During this portion of the assessment, [name] reported history of depression and ADHD since age 89. She stated that she has a history of medication management and therapy, but was a limited historian as she was unable to clarify prior medications.  Per [name], she has not needed therapy or medications since prior to her oldest child being born.  [name] denied need for therapy at this time, and shared belief that she "can handle it".   [name] expressed interest in referral information for therapists postpartum if needs arise.   CSW continued to provide support in order to assist the [name] explore how to apply previously learned emotional regulation skills postpartum.  [name] denied history of postpartum depression, but acknowledged that her depressive symptoms during this pregnancy may indicate her mental health postpartum.  [name] to follow up with her medical provider if she notes that symptoms intensify, worsen, or if she believes that she cannot cope with them.   [name] reported late prenatal care due to barriers to transportation. She stated that she attempted to utilize Medicaid transportation, but found the paperwork to be difficult to complete. [name] stated that she now has a bus pass and identified no additional barriers to care.    [name] reported marijuana use to assist with increasing her appetite and coping with depression.  [name] reported last THC use 2-3 days ago.  [name] did not clarify exact use, but stated that it was not every day.  She verbalized understanding of the hospital drug screen policy, and she stated that she had a friend who recently had a baby who tested positive for THC. [name] asked normative questions related to what to anticipate and expect, and reported that she is agreeable to CPS follow up if the infant's toxicology screens are positive. She denied additional questions, concerns, or needs at this time.  [name] expressed appreciation for the visit and support. She acknowledged ongoing CSW availability, and agreed to contact CSW if needs arise.   CSW Plan/Description:   1)Patient/Family Education: Perinatal mood and anxiety disorders, hospital drug screen policy 2)Information/Referral to [facility]-- outpatient mental health resources 3) CSW to monitor infant's toxicology screens, and will make a CPS report if positive.  4)No Further Intervention Required/No Barriers to Discharge    Kelby FamVenning, Andreka Stucki N,  LCSW 01/22/2015, 12:27 PM

## 2015-01-23 ENCOUNTER — Encounter (HOSPITAL_COMMUNITY): Payer: Self-pay | Admitting: *Deleted

## 2015-01-27 NOTE — H&P (Signed)
LABOR ADMISSION HISTORY AND PHYSICAL  Monique Gamble is a 24 y.o. female G5P3013 with IUP at [redacted]w[redacted]d presenting for active labor with SROM @ 0015. She reports +FM, + contractions, no blurry vision, headaches or peripheral edema, and RUQ pain.  She plans on bottle feeding. She request BTL for birth control.  Dating: By 14 week u/s --->  Estimated Date of Delivery: 02/01/15  Sono:    @37 w, CWD, normal anatomy, Cepahlic presentation, 2795 g, 35% EFW   Prenatal History/Complications: [facility] Prenatal Labs  Dating 14 week US Blood type: --/--/O POS (01/28 1420)   Genetic Screen 1 Screen:    AFP:     Quad:     NIPS: Antibody:NEG (01/28 1420)  Anatomic US wnl except artifact vs anterior fossa abnormality, repeat US in 3 weeks Rubella: 0.44 (12/25 2340)  GTT    Third trimester: 97 RPR: Non Reactive (01/28 1420)   Flu vaccine  HBsAg: NEGATIVE (12/25 2340)   TDaP vaccine       12/19/14                                   HIV: NONREACTIVE (12/25 2340)   Baby Food                                               GBS:Negative (12/28 0000)(For PCN allergy, check sensitivities)  Contraception  BTL - papers signed 01/15/15 Pap:  Circumcision    Pediatrician    Support Person      Past Medical History: Past Medical History  Diagnosis Date  . Chlamydia   . Headache   . History of anemia   . ADHD (attention deficit hyperactivity disorder)     Past Surgical History: Past Surgical History  Procedure Laterality Date  . Induced abortion      Obstetrical History: OB History    Gravida Para Term Preterm AB TAB SAB Ectopic Multiple Living   5 3 3 0 1 1 0 0 0 3      Social History: Social History   Social History  . Marital Status: Single    Spouse Name: N/A  . Number of Children: N/A  . Years of Education: N/A   Social History Main Topics  . Smoking status: Never Smoker   . Smokeless tobacco: Never Used  . Alcohol Use: No  . Drug Use: Yes    Special: Marijuana     Comment:  occasionally to help appetite  . Sexual Activity: Not Currently    Birth Control/ Protection: None     Comment: Intercourse last night   Other Topics Concern  . None   Social History Narrative    Family History: Family History  Problem Relation Age of Onset  . Anesthesia problems Neg Hx   . Hypertension Mother   . Diabetes Sister     Allergies: No Known Allergies  Prescriptions prior to admission  Medication Sig Dispense Refill Last Dose  . butalbital-acetaminophen-caffeine (ESGIC PLUS) 50-500-40 MG tablet Take 1-2 tablets by mouth every 4 (four) hours as needed for pain or headache. (Patient not taking: Reported on 01/15/2015) 30 tablet 0 Not Taking  . butalbital-acetaminophen-caffeine (FIORICET, ESGIC) 50-325-40 MG tablet take 1-2 tablets by mouth every 4 hours if needed for pain  0   .   cephALEXin (KEFLEX) 500 MG capsule Take 1 capsule (500 mg total) by mouth 3 (three) times daily. (Patient not taking: Reported on 01/15/2015) 21 capsule 0 Not Taking  . ferrous sulfate (FERROUSUL) 325 (65 FE) MG tablet Take 1 tablet (325 mg total) by mouth daily with breakfast. 30 tablet 3   . Prenatal Multivit-Min-Fe-FA (PRENATAL VITAMINS PO) Take 2 tablets by mouth daily. Taking over the counter gummies from [facility]   Taking  . promethazine (PHENERGAN) 25 MG tablet Take 0.5-1 tablets (12.5-25 mg total) by mouth every 6 (six) hours as needed. 30 tablet 2 Taking  . ranitidine (ZANTAC) 150 MG tablet Take 1 tablet (150 mg total) by mouth 2 (two) times daily. 60 tablet 2 Taking  . terconazole (TERAZOL 3) 0.8 % vaginal cream Place 1 applicator vaginally at bedtime. 20 g 0      Review of Systems   All systems reviewed and negative except as stated in HPI  Ht 5' 6" (1.676 m)  Wt 188 lb (85.276 kg)  BMI 30.36 kg/m2  LMP 04/27/2014 General appearance: alert and cooperative Lungs: clear to auscultation bilaterally Heart: regular rate and rhythm Abdomen: soft, non-tender; bowel sounds  normal Extremities: Homans sign is negative, no sign of DVT, edema Presentation: cephalic per nursing  Fetal monitoringBaseline: 120 bpm, Variability: Good {> 6 bpm), Accelerations: Reactive and Decelerations: Absent Uterine activity: Patient endorse contractions every 2 minutes   Prenatal labs: ABO, Rh: O/POS/-- (11/02 1048) Antibody: NEG (11/02 1048) Rubella: !Error! RPR: NON REAC (11/02 1048)  HBsAg: NEGATIVE (11/02 1048)  HIV: NONREACTIVE (11/02 1048)  GBS: Negative (11/29 0000)  1 hr Glucola 97 Genetic screening  None  Anatomy US wnl except artifact vs anterior fossa abnormality, repeat US in 3 weeks  Prenatal Transfer Tool  Maternal Diabetes: No Genetic Screening: Declined Maternal Ultrasounds/Referrals: Normal Fetal Ultrasounds or other Referrals:  None Maternal Substance Abuse:  No Significant Maternal Medications:  None Significant Maternal Lab Results: None  No results found for this or any previous visit (from the past 24 hour(s)).  Patient Active Problem List   Diagnosis Date Noted  . Supervision of normal subsequent pregnancy 12/19/2014  . Headache in pregnancy 12/19/2014  . Nausea and vomiting during pregnancy 12/19/2014    Assessment: Monique Gamble is a 24 y.o. G5P3013 at [redacted]w[redacted]d here for active labor   #Labor: Expectant  #Pain: Fentanyl, declines epidural  #FWB: Category 1  #ID:  GBS negative  #MOF:  Bottle #MOC: BTL  #Circ:  Outpatient vs. Inpatient   Asiyah Mikell, MD        

## 2015-01-29 ENCOUNTER — Encounter: Payer: Medicaid Other | Admitting: Student

## 2015-01-30 ENCOUNTER — Telehealth: Payer: Self-pay | Admitting: *Deleted

## 2015-01-30 ENCOUNTER — Encounter (HOSPITAL_COMMUNITY): Payer: Self-pay | Admitting: *Deleted

## 2015-01-30 ENCOUNTER — Inpatient Hospital Stay (HOSPITAL_COMMUNITY)
Admission: AD | Admit: 2015-01-30 | Discharge: 2015-02-01 | DRG: 776 | Disposition: A | Discharge status: Home / Self Care | Payer: Medicaid Other | Source: Ambulatory Visit | Attending: Obstetrics and Gynecology | Admitting: Obstetrics and Gynecology

## 2015-01-30 DIAGNOSIS — O1415 Severe pre-eclampsia, complicating the puerperium: Principal | ICD-10-CM | POA: Diagnosis present

## 2015-01-30 DIAGNOSIS — Z833 Family history of diabetes mellitus: Secondary | ICD-10-CM

## 2015-01-30 DIAGNOSIS — O8620 Urinary tract infection following delivery, unspecified: Secondary | ICD-10-CM | POA: Diagnosis present

## 2015-01-30 DIAGNOSIS — O09299 Supervision of pregnancy with other poor reproductive or obstetric history, unspecified trimester: Secondary | ICD-10-CM | POA: Diagnosis present

## 2015-01-30 DIAGNOSIS — O1495 Unspecified pre-eclampsia, complicating the puerperium: Secondary | ICD-10-CM

## 2015-01-30 DIAGNOSIS — N390 Urinary tract infection, site not specified: Secondary | ICD-10-CM | POA: Diagnosis not present

## 2015-01-30 DIAGNOSIS — Z8249 Family history of ischemic heart disease and other diseases of the circulatory system: Secondary | ICD-10-CM

## 2015-01-30 LAB — COMPREHENSIVE METABOLIC PANEL
ALBUMIN: 2.9 g/dL — AB (ref 3.5–5.0)
ALK PHOS: 123 U/L (ref 38–126)
ALT: 33 U/L (ref 14–54)
AST: 50 U/L — AB (ref 15–41)
Anion gap: 9 (ref 5–15)
BUN: 10 mg/dL (ref 6–20)
CALCIUM: 8.7 mg/dL — AB (ref 8.9–10.3)
CHLORIDE: 109 mmol/L (ref 101–111)
CO2: 22 mmol/L (ref 22–32)
CREATININE: 0.65 mg/dL (ref 0.44–1.00)
GFR calc non Af Amer: >60 mL/min (ref >60)
GLUCOSE: 91 mg/dL (ref 65–99)
Potassium: 3.9 mmol/L (ref 3.5–5.1)
SODIUM: 140 mmol/L (ref 135–145)
Total Bilirubin: 0.6 mg/dL (ref 0.3–1.2)
Total Protein: 7.2 g/dL (ref 6.5–8.1)

## 2015-01-30 LAB — PROTEIN / CREATININE RATIO, URINE
Creatinine, Urine: 140 mg/dL
Protein Creatinine Ratio: 0.29 mg/mg{Cre} — ABNORMAL HIGH (ref 0.00–0.15)
TOTAL PROTEIN, URINE: 41 mg/dL

## 2015-01-30 LAB — URINALYSIS, ROUTINE W REFLEX MICROSCOPIC
BILIRUBIN URINE: NEGATIVE
Glucose, UA: NEGATIVE mg/dL
KETONES UR: NEGATIVE mg/dL
NITRITE: POSITIVE — AB
PH: 6.5 (ref 5.0–8.0)
PROTEIN: 30 mg/dL — AB
Specific Gravity, Urine: 1.02 (ref 1.005–1.030)

## 2015-01-30 LAB — POCT PREGNANCY, URINE: Preg Test, Ur: NEGATIVE

## 2015-01-30 LAB — URINE MICROSCOPIC-ADD ON

## 2015-01-30 LAB — CBC
HCT: 27.3 % — ABNORMAL LOW (ref 36.0–46.0)
HEMOGLOBIN: 8.2 g/dL — AB (ref 12.0–15.0)
MCH: 24 pg — AB (ref 26.0–34.0)
MCHC: 30 g/dL (ref 30.0–36.0)
MCV: 79.8 fL (ref 78.0–100.0)
PLATELETS: 301 10*3/uL (ref 150–400)
RBC: 3.42 MIL/uL — AB (ref 3.87–5.11)
RDW: 16.7 % — ABNORMAL HIGH (ref 11.5–15.5)
WBC: 10 10*3/uL (ref 4.0–10.5)

## 2015-01-30 MED ORDER — LACTATED RINGERS IV SOLN
INTRAVENOUS | Status: DC
  Administered 2015-01-30 – 2015-02-01 (×3): via INTRAVENOUS

## 2015-01-30 MED ORDER — MAGNESIUM SULFATE 50 % IJ SOLN
2.0000 g/h | INTRAVENOUS | Status: AC
  Filled 2015-01-30: qty 80

## 2015-01-30 MED ORDER — DOCUSATE SODIUM 100 MG PO CAPS
100.0000 mg | ORAL_CAPSULE | Freq: Every day | ORAL | Status: DC
  Administered 2015-01-31: 100 mg via ORAL
  Filled 2015-01-30: qty 1

## 2015-01-30 MED ORDER — ACETAMINOPHEN 325 MG PO TABS
650.0000 mg | ORAL_TABLET | ORAL | Status: DC | PRN
  Administered 2015-01-31: 650 mg via ORAL
  Filled 2015-01-30: qty 2

## 2015-01-30 MED ORDER — MAGNESIUM SULFATE BOLUS VIA INFUSION
4.0000 g | Freq: Once | INTRAVENOUS | Status: AC
  Administered 2015-01-30: 4 g via INTRAVENOUS
  Filled 2015-01-30: qty 500

## 2015-01-30 MED ORDER — ZOLPIDEM TARTRATE 5 MG PO TABS: 5.0000 mg | ORAL_TABLET | Freq: Every evening | ORAL | Status: DC | PRN

## 2015-01-30 MED ORDER — CALCIUM CARBONATE ANTACID 500 MG PO CHEW: 2.0000 | CHEWABLE_TABLET | ORAL | Status: DC | PRN

## 2015-01-30 NOTE — Telephone Encounter (Signed)
Called Dr. Macon LargeAnyanwu to discuss patient. Message left with OR nurse to have her call us back.

## 2015-01-30 NOTE — MAU Note (Signed)
Pt delivered 01/21/2015 and states that her home health nurse came by this morning and says that her BP was high. States that she has a headache rates 6/10. Did not take anything for it. Denies vision changes. Also has some RUQ pain that comes and goes. Denies any other problems.

## 2015-01-30 NOTE — Telephone Encounter (Signed)
I spoke with [place] about the patient and she recommends that patient come to MAU today for evaluation of post partum preeclampsia. Patient informed of this recommendation and stated that she will have someone bring her today.

## 2015-01-30 NOTE — Telephone Encounter (Signed)
Patients home health nurse called to inform us that she is visiting patient today and found her blood pressure to be elevated. She is not on any meds currently. Patient denies headaches visual disturbances and swelling. Advised nurse that I will talk to on call provider and call patient back with instructions. Pt delivered on 12/5.

## 2015-01-30 NOTE — H&P (Signed)
Monique Gamble is an 24 y.o. female. Who is 7 days postpartum following a vaginal delivery who presents with postpartum preeclampsia. She was noted tohave elevated BPs on a home visit today.   Pertinent Gynecological History: Menses: n/a Bleeding: Postpartum lochia OB History: G5, P4014    Past Medical History  Diagnosis Date  . Chlamydia   . Headache   . History of anemia   . ADHD (attention deficit hyperactivity disorder)     Past Surgical History  Procedure Laterality Date  . Induced abortion      Family History  Problem Relation Age of Onset  . Anesthesia problems Neg Hx   . Hypertension Mother   . Diabetes Sister     Social History:  reports that she has never smoked. She has never used smokeless tobacco. She reports that she uses illicit drugs (Marijuana). She reports that she does not drink alcohol.  Allergies: No Known Allergies  Prescriptions prior to admission  Medication Sig Dispense Refill Last Dose  . ferrous sulfate 325 (65 FE) MG tablet Take 1 tablet (325 mg total) by mouth 2 (two) times daily with a meal. 90 tablet 1   . ibuprofen (ADVIL,MOTRIN) 600 MG tablet Take 1 tablet (600 mg total) by mouth every 6 (six) hours. 90 tablet 0   . Prenatal Multivit-Min-Fe-FA (PRENATAL VITAMINS PO) Take 2 tablets by mouth daily. Taking over the counter gummies from [facility]   Past Week at Unknown time    Review of Systems  Constitutional: Negative for fever and chills.  Eyes: Negative for blurred vision, double vision and photophobia.  Respiratory: Negative for shortness of breath.   Cardiovascular: Negative for chest pain and leg swelling.  Gastrointestinal: Negative for nausea, vomiting, abdominal pain, diarrhea and constipation.  Genitourinary: Negative for dysuria.  Musculoskeletal: Negative for myalgias and back pain.  Neurological: Positive for headaches. Negative for dizziness, sensory change, speech change, focal weakness, seizures and weakness.    Blood  pressure 171/88, pulse 59, temperature 98.5 F (36.9 C), temperature source Oral, resp. rate 18, height 5\' 4"  (1.626 m), weight 81.738 kg (180 lb 3.2 oz), last menstrual period 04/27/2014, SpO2 99 %, unknown if currently breastfeeding.  Filed Vitals:   01/30/15 2018 01/30/15 2048 01/30/15 2103  BP: 168/94 170/95 171/88  Pulse: 88 70 59  Temp: 98.5 F (36.9 C)    TempSrc: Oral    Resp: 18    Height: 5\' 4"  (1.626 m)    Weight: 81.738 kg (180 lb 3.2 oz)    SpO2: 99%      Physical Exam  Constitutional: She is oriented to person, place, and time. She appears well-developed and well-nourished. No distress.  HENT:  Head: Normocephalic.  Neck: Normal range of motion. Neck supple.  Cardiovascular: Normal rate, regular rhythm and normal heart sounds.  Exam reveals no gallop and no friction rub.   No murmur heard. Respiratory: Effort normal and breath sounds normal. No respiratory distress. She has no wheezes. She has no rales. She exhibits no tenderness.  GI: Soft. She exhibits no distension. There is no tenderness. There is no rebound and no guarding.  Musculoskeletal: Normal range of motion. She exhibits no edema (only trace).  Neurological: She is alert and oriented to person, place, and time. She displays abnormal reflex (brisk). She exhibits abnormal muscle tone (1 beat clonus).  Skin: Skin is warm and dry.  Psychiatric: She has a normal mood and affect.    Results for orders placed or performed during the  hospital encounter of 01/30/15 (from the past 24 hour(s))  Urinalysis, Routine w reflex microscopic (not at [facility])     Status: Abnormal   Collection Time: 01/30/15  8:13 PM  Result Value Ref Range   Color, Urine YELLOW YELLOW   APPearance HAZY (A) CLEAR   Specific Gravity, Urine 1.020 1.005 - 1.030   pH 6.5 5.0 - 8.0   Glucose, UA NEGATIVE NEGATIVE mg/dL   Hgb urine dipstick LARGE (A) NEGATIVE   Bilirubin Urine NEGATIVE NEGATIVE   Ketones, ur NEGATIVE NEGATIVE mg/dL   Protein,  ur 30 (A) NEGATIVE mg/dL   Nitrite POSITIVE (A) NEGATIVE   Leukocytes, UA LARGE (A) NEGATIVE  Urine microscopic-add on     Status: Abnormal   Collection Time: 01/30/15  8:13 PM  Result Value Ref Range   Squamous Epithelial / LPF 0-5 (A) NONE SEEN   WBC, UA 6-30 0 - 5 WBC/hpf   RBC / HPF 6-30 0 - 5 RBC/hpf   Bacteria, UA MANY (A) NONE SEEN   Urine-Other MUCOUS PRESENT   CBC     Status: Abnormal   Collection Time: 01/30/15  8:31 PM  Result Value Ref Range   WBC 10.0 4.0 - 10.5 K/uL   RBC 3.42 (L) 3.87 - 5.11 MIL/uL   Hemoglobin 8.2 (L) 12.0 - 15.0 g/dL   HCT 16.1 (L) 09.6 - 04.5 %   MCV 79.8 78.0 - 100.0 fL   MCH 24.0 (L) 26.0 - 34.0 pg   MCHC 30.0 30.0 - 36.0 g/dL   RDW 40.9 (H) 81.1 - 91.4 %   Platelets 301 150 - 400 K/uL  Comprehensive metabolic panel     Status: Abnormal   Collection Time: 01/30/15  8:31 PM  Result Value Ref Range   Sodium 140 135 - 145 mmol/L   Potassium 3.9 3.5 - 5.1 mmol/L   Chloride 109 101 - 111 mmol/L   CO2 22 22 - 32 mmol/L   Glucose, Bld 91 65 - 99 mg/dL   BUN 10 6 - 20 mg/dL   Creatinine, Ser 7.82 0.44 - 1.00 mg/dL   Calcium 8.7 (L) 8.9 - 10.3 mg/dL   Total Protein 7.2 6.5 - 8.1 g/dL   Albumin 2.9 (L) 3.5 - 5.0 g/dL   AST 50 (H) 15 - 41 U/L   ALT 33 14 - 54 U/L   Alkaline Phosphatase 123 38 - 126 U/L   Total Bilirubin 0.6 0.3 - 1.2 mg/dL   GFR calc non Af Amer >60 >60 mL/min   GFR calc Af Amer >60 >60 mL/min   Anion gap 9 5 - 15  Pregnancy, urine POC     Status: None   Collection Time: 01/30/15  8:49 PM  Result Value Ref Range   Preg Test, Ur NEGATIVE NEGATIVE    No results found.  Assessment/Plan: Postpartum preeclampsia (BPs in severe range, headache, elevated AST, proteinuria) UTI  Admit to antenatal/AICU Consulted Dr Emelda Fear Magnesium Sulfate for 24 hours Usual orders for preeclampsia care  [facility] 01/30/2015, 9:33 PM

## 2015-01-31 MED ORDER — OXYCODONE-ACETAMINOPHEN 5-325 MG PO TABS
1.0000 | ORAL_TABLET | ORAL | Status: DC | PRN
  Administered 2015-01-31 – 2015-02-01 (×4): 2 via ORAL
  Filled 2015-01-31 (×4): qty 2

## 2015-01-31 NOTE — Progress Notes (Signed)
Post Partum Day 10 Subjective: continues to have mild h/a, but review of PNR and hospital records indicate that h/a was frequent in 3rd trimester  Objective: Blood pressure 121/65, pulse 76, temperature 98.8 F (37.1 C), temperature source Oral, resp. rate 18, height 5\' 6"  (1.676 m), weight 81.647 kg (180 lb), last menstrual period 04/27/2014, SpO2 99 %, unknown if currently breastfeeding. Prior Vs show a gradual improving trend. Physical Exam:  General: alert, cooperative and no distress Lochia: appropriate Uterine Fundus: too small to locate Incision:  DVT Evaluation: No evidence of DVT seen on physical exam.   Recent Labs  01/30/15 2031  HGB 8.2*  HCT 27.3*    Assessment/Plan: continue Mag sulfate til 9 pm tonite.  May need antihypertensiive meds Plan for discharge tomorrow   LOS: 1 day   Monique Gamble 01/31/2015, 7:19 AM

## 2015-02-01 DIAGNOSIS — Z833 Family history of diabetes mellitus: Secondary | ICD-10-CM

## 2015-02-01 DIAGNOSIS — O1415 Severe pre-eclampsia, complicating the puerperium: Principal | ICD-10-CM

## 2015-02-01 DIAGNOSIS — O8620 Urinary tract infection following delivery, unspecified: Secondary | ICD-10-CM

## 2015-02-01 DIAGNOSIS — Z8249 Family history of ischemic heart disease and other diseases of the circulatory system: Secondary | ICD-10-CM

## 2015-02-01 MED ORDER — ENALAPRIL MALEATE 5 MG PO TABS: 5.0000 mg | ORAL_TABLET | Freq: Every day | ORAL | Status: DC

## 2015-02-01 MED ORDER — HYDRALAZINE HCL 20 MG/ML IJ SOLN
10.0000 mg | INTRAMUSCULAR | Status: DC | PRN
  Administered 2015-02-01: 10 mg via INTRAVENOUS
  Filled 2015-02-01: qty 1

## 2015-02-01 MED ORDER — CYCLOBENZAPRINE HCL 5 MG PO TABS: 5.0000 mg | ORAL_TABLET | Freq: Three times a day (TID) | ORAL | Status: DC | PRN

## 2015-02-01 MED ORDER — ENALAPRIL MALEATE 10 MG PO TABS
10.0000 mg | ORAL_TABLET | Freq: Every day | ORAL | Status: DC
  Administered 2015-02-01: 10 mg via ORAL
  Filled 2015-02-01: qty 1

## 2015-02-01 MED ORDER — AMLODIPINE BESYLATE 5 MG PO TABS: 5.0000 mg | ORAL_TABLET | Freq: Every day | ORAL | Status: DC

## 2015-02-01 MED ORDER — ENALAPRIL MALEATE 10 MG PO TABS: 10.0000 mg | ORAL_TABLET | Freq: Every day | ORAL | Status: DC

## 2015-02-01 NOTE — Progress Notes (Signed)
Genevive BiKatie Caci Orren,RNC spoke with Dr Adrian BlackwaterStinson about calling in some headache medicine for patient, Dr will call in to pt's pharmacy so pt may pick it up when discharged. RN also told Dr Adrian BlackwaterStinson that pt's blood pressure is 179/103, Dr said that it will take about an hour for the blood pressure medicine to work, pt may not be discharged until b/p is wnl.

## 2015-02-01 NOTE — Progress Notes (Signed)
Monique Gamble,RNC spoke with Dr Debroah LoopArnold and told of pt still has increased b/p's. Dr will come and see pt and discuss plan of care.

## 2015-02-01 NOTE — Discharge Instructions (Signed)

## 2015-02-01 NOTE — Discharge Summary (Signed)
OB Discharge Summary     Patient Name: Monique Gamble DOB: Feb 10, 1991 MRN: [id number]  Date of admission: 01/30/2015 Delivering MD: This patient has no babies on file.  Date of discharge: 02/01/2015  Admitting diagnosis: delivered, hbp Intrauterine pregnancy: Unknown     Secondary diagnosis:  Active Problems:   Preeclampsia in postpartum period  Additional problems: none     Discharge diagnosis: Preeclampsia (severe)                                                                                                Post partum procedures:magnesium  Hospital course:  This is a [id number] who delivered via SVD 9 days ago.  She developed severe preeclampsia and was admitted for magnesium sulfate administration for seizure prophylaxis.  Her magnesium was discontinued.  As her blood pressures remained elevated, she was started on vasotec.  She will be discharged today to follow up in 1 week for blood pressure check in the clinic.  Physical exam  Filed Vitals:   02/01/15 0106 02/01/15 0206 02/01/15 0306 02/01/15 0420  BP: 138/82 126/81 135/94 145/88  Pulse: 71 69 75 78  Temp:      TempSrc:      Resp:      Height:      Weight:      SpO2: 100%  100% 100%   General: alert, cooperative and no distress Heart: regular rate, no murmur Lungs: clear to auscultation bilaterally, no wheezing. Uterine Fundus: firm DVT Evaluation: No evidence of DVT seen on physical exam. Negative Homan's sign. Labs: Lab Results  Component Value Date   WBC 10.0 01/30/2015   HGB 8.2* 01/30/2015   HCT 27.3* 01/30/2015   MCV 79.8 01/30/2015   PLT 301 01/30/2015   CMP Latest Ref Rng 01/30/2015  Glucose 65 - 99 mg/dL 91  BUN 6 - 20 mg/dL 10  Creatinine 1.300.44 - 8.651.00 mg/dL 7.840.65  Sodium 696135 - 295145 mmol/L 140  Potassium 3.5 - 5.1 mmol/L 3.9  Chloride 101 - 111 mmol/L 109  CO2 22 - 32 mmol/L 22  Calcium 8.9 - 10.3 mg/dL 2.8(U8.7(L)  Total Protein 6.5 - 8.1 g/dL 7.2  Total Bilirubin 0.3 - 1.2 mg/dL 0.6   Alkaline Phos 38 - 126 U/L 123  AST 15 - 41 U/L 50(H)  ALT 14 - 54 U/L 33    Discharge instruction: per After Visit Summary and "Baby and Me Booklet".  After visit meds:    Medication List    TAKE these medications        enalapril 10 MG tablet  Commonly known as:  VASOTEC  Take 1 tablet (10 mg total) by mouth daily.     ferrous sulfate 325 (65 FE) MG tablet  Take 1 tablet (325 mg total) by mouth 2 (two) times daily with a meal.     ibuprofen 600 MG tablet  Commonly known as:  ADVIL,MOTRIN  Take 1 tablet (600 mg total) by mouth every 6 (six) hours.     PRENATAL VITAMINS PO  Take 2 tablets by mouth daily. Taking over the counter gummies from  [facility]        Diet: routine diet  Activity: Advance as tolerated. Pelvic rest for 6 weeks.   Outpatient follow up:1 week Follow up Appt:Future Appointments Date Time Provider Department Center  02/08/2015 8:45 AM Adam Phenix, MD [facility]  02/27/2015 1:00 PM Hurshel Party, CNM [facility]   Follow up Visit:No Follow-up on file.  Postpartum contraception: Tubal Ligation - planned  02/01/2015 Candelaria Celeste JEHIEL, DO

## 2015-02-01 NOTE — Progress Notes (Signed)
Post Partum Day 9 Subjective: no complaints  Objective: Blood pressure 134/75, pulse 73, temperature 100 F (37.8 C), temperature source Oral, resp. rate 16, height 5\' 6"  (1.676 m), weight 81.647 kg (180 lb), last menstrual period 04/27/2014, SpO2 100 %, unknown if currently breastfeeding.  Filed Vitals:   02/01/15 1203 02/01/15 1236  BP: 139/82 134/75  Pulse: 79 73  Temp: 100 F (37.8 C)   Resp:     Elevated BP treated with apresoline with good effect   Recent Labs  01/30/15 2031  HGB 8.2*  HCT 27.3*    Assessment/Plan: Postpartum hypertension, d/c Vasotec and change to Norvasc 5 mg daily   LOS: 2 days   ARNOLD,JAMES 02/01/2015, 12:41 PM

## 2015-02-02 ENCOUNTER — Other Ambulatory Visit: Payer: Self-pay | Admitting: Obstetrics & Gynecology

## 2015-02-05 ENCOUNTER — Encounter: Payer: Medicaid Other | Admitting: Student

## 2015-02-08 ENCOUNTER — Encounter: Payer: Medicaid Other | Admitting: Obstetrics & Gynecology

## 2015-02-27 ENCOUNTER — Ambulatory Visit (INDEPENDENT_AMBULATORY_CARE_PROVIDER_SITE_OTHER): Payer: Medicaid Other | Admitting: Obstetrics & Gynecology

## 2015-02-27 ENCOUNTER — Encounter: Payer: Self-pay | Admitting: Obstetrics & Gynecology

## 2015-02-27 VITALS — BP 140/83 | HR 91 | Temp 98.2°F | Ht 66.0 in | Wt 169.0 lb

## 2015-02-27 DIAGNOSIS — Z3042 Encounter for surveillance of injectable contraceptive: Secondary | ICD-10-CM

## 2015-02-27 DIAGNOSIS — O135 Gestational [pregnancy-induced] hypertension without significant proteinuria, complicating the puerperium: Secondary | ICD-10-CM

## 2015-02-27 DIAGNOSIS — Z3009 Encounter for other general counseling and advice on contraception: Secondary | ICD-10-CM

## 2015-02-27 MED ORDER — AMLODIPINE BESYLATE 5 MG PO TABS: 5.0000 mg | ORAL_TABLET | Freq: Every day | ORAL | Status: DC

## 2015-02-27 MED ORDER — MEDROXYPROGESTERONE ACETATE 104 MG/0.65ML ~~LOC~~ SUSY: 104.0000 mg | PREFILLED_SYRINGE | Freq: Once | SUBCUTANEOUS | Status: DC

## 2015-02-27 MED ORDER — MEDROXYPROGESTERONE ACETATE 150 MG/ML IM SUSP
150.0000 mg | Freq: Once | INTRAMUSCULAR | Status: AC
  Administered 2015-02-27: 150 mg via INTRAMUSCULAR

## 2015-02-27 NOTE — Patient Instructions (Signed)
Laparoscopic Tubal Ligation Laparoscopic tubal ligation is a procedure that closes the fallopian tubes at a time other than right after childbirth. When the fallopian tubes are closed, the eggs that are released from the ovaries cannot enter the uterus, and sperm cannot reach the egg. Tubal ligation is also known as getting your "tubes tied." Tubal ligation is done so you will not be able to get pregnant or have a baby. Although this procedure may be undone (reversed), it should be considered permanent and irreversible. If you want to have future pregnancies, you should not have this procedure. LET YOUR HEALTH CARE PROVIDER KNOW ABOUT:  Any allergies you have.  All medicines you are taking, including vitamins, herbs, eye drops, creams, and over-the-counter medicines. This includes any use of steroids, either by mouth or in cream form.  Previous problems you or members of your family have had with the use of anesthetics.  Any blood disorders you have.  Previous surgeries you have had.  Any medical conditions you may have.  Possibility of pregnancy, if this applies.  Any past pregnancies. RISKS AND COMPLICATIONS  Infection.  Bleeding.  Injury to surrounding organs.  Side effects from anesthetics.  Failure of the procedure.  Ectopic pregnancy.  Future regret about having the procedure done. BEFORE THE PROCEDURE  Ask your health care provider about:  Changing or stopping your regular medicines. This is especially important if you are taking diabetes medicines or blood thinners.  Taking medicines such as aspirin and ibuprofen. These medicines can thin your blood. Do not take these medicines before your procedure if your health care provider instructs you not to.  Follow instructions from your health care provider about eating and drinking restrictions.  Plan to have someone take you home after the procedure.  If you go home right after the procedure, plan to have someone  with you for 24 hours. PROCEDURE  You will be given one or more of the following:  A medicine that helps you relax (sedative).  A medicine that numbs the area (local anesthetic).  A medicine that makes you fall asleep (general anesthetic).  A medicine that is injected into an area of your body that numbs everything below the injection site (regional anesthetic).  If you have been given general anesthetic, a tube will be put down your throat to help you breathe.  Two small cuts (incisions) will be made in the lower abdominal area and near the belly button.  Your bladder may be emptied with a small tube (catheter).  Your abdomen will be inflated with a safe gas (carbon dioxide). This will help to give the surgeon room to operate and visualize, and it will help the surgeon to avoid other organs.  A thin, lighted tube (laparoscope) with a camera attached will be inserted into your abdomen through one of the incisions near the belly button. Other small instruments will be inserted through the other abdominal incision.  The fallopian tubes will be tied off or burned (cauterized), or they will be blocked with a clip, ring, or clamp. In many cases, a small portion in the center of each fallopian tube will also be removed.  After the fallopian tubes are blocked, the gas will be released from the abdomen.  The incisions will be closed with stitches (sutures).  A bandage (dressing) will be placed over the incisions. The procedure may vary among health care providers and hospitals. AFTER THE PROCEDURE  Your blood pressure, heart rate, breathing rate, and blood oxygen level   will be monitored often until the medicines you were given have worn off.  You will be given pain medicine as needed.  If you had general anesthetic, you may have some mild discomfort in your throat. This is from the breathing tube that was placed in your throat while you were sleeping.  You may experience discomfort in  the shoulder area from some trapped air between your liver and your diaphragm. This sensation is normal, and it will slowly go away on its own.  You will have some mild abdominal discomfort for 3--7 days.   This information is not intended to replace advice given to you by your health care provider. Make sure you discuss any questions you have with your health care provider.   Document Released: 05/11/2000 Document Revised: 06/19/2014 Document Reviewed: 05/16/2011 Elsevier Interactive Patient Education 2016 [employer]  

## 2015-02-27 NOTE — Progress Notes (Signed)
Subjective:     Monique Gamble is a 25 y.o. female who presents for a postpartum visit. She is 5 weeks postpartum following a spontaneous vaginal delivery. I have fully reviewed the prenatal and intrapartum course. The delivery was at 38.3 gestational weeks. Outcome: spontaneous vaginal delivery. Anesthesia: none. Postpartum course has been unremarkable. Baby's course has been unremarkable. Baby is feeding by bottle - Similac Advance. Bleeding no bleeding. Bowel function is normal. Bladder function is normal. Patient is not sexually active. Contraception method is abstinence. Patient is interested in condoms until BTL. Postpartum depression screening: negative.  The following portions of the patient's history were reviewed and updated as appropriate: allergies, current medications, past family history, past medical history, past social history, past surgical history and problem list.  Review of Systems Pertinent items are noted in HPI.   Objective:    BP 140/83 mmHg  Pulse 91  Temp(Src) 98.2 F (36.8 C) (Oral)  Ht 5\' 6"  (1.676 m)  Wt 169 lb (76.658 kg)  BMI 27.29 kg/m2  Breastfeeding? No      abd: soft, NT, ND.NO incisions.  Assessment:     5 weeks postpartum exam. Pap smear not done at today's visit.   h/o preeclampsia. On Norvasc. Contraception management- desires permanent sterilization.  Risks of regret discussed.   Plan:    1. Contraception: Depo-Provera injections 2. Will schedule for lap BTL 3. Continue Norvasc 5 mg daily.    Patient desires surgical management with laparoscopic BTL.  The risks of surgery were discussed in detail with the patient including but not limited to: bleeding which may require transfusion or reoperation; infection which may require prolonged hospitalization or re-hospitalization and antibiotic therapy; injury to bowel, bladder, ureters and major vessels or other surrounding organs; need for additional procedures including laparotomy;  thromboembolic phenomenon, incisional problems and other postoperative or anesthesia complications.  Patient was told that the likelihood that her condition and symptoms will be treated effectively with this surgical management was very high; the postoperative expectations were also discussed in detail. The patient also understands the alternative treatment options which were discussed in full. All questions were answered.  She was told that she will be contacted by our surgical scheduler regarding the time and date of her surgery; routine preoperative instructions of having nothing to eat or drink after midnight on the day prior to surgery and also coming to the hospital 1 1/2 hours prior to her time of surgery were also emphasized.  She was told she may be called for a preoperative appointment about a week prior to surgery and will be given further preoperative instructions at that visit. Printed patient education handouts about the procedure were given to the patient to review at home.  Advay Volante L. Harraway-Smith, M.D., Evern CoreFACOG

## 2015-03-04 ENCOUNTER — Inpatient Hospital Stay (HOSPITAL_COMMUNITY): Admission: RE | Admit: 2015-03-04 | Payer: Medicaid Other | Source: Ambulatory Visit

## 2015-03-07 ENCOUNTER — Encounter (HOSPITAL_COMMUNITY): Payer: Self-pay | Admitting: *Deleted

## 2015-03-11 ENCOUNTER — Encounter (HOSPITAL_COMMUNITY): Payer: Self-pay | Admitting: Anesthesiology

## 2015-03-11 ENCOUNTER — Encounter (HOSPITAL_COMMUNITY): Admission: RE | Payer: Self-pay | Source: Ambulatory Visit

## 2015-03-11 ENCOUNTER — Ambulatory Visit (HOSPITAL_COMMUNITY)
Admission: RE | Admit: 2015-03-11 | Payer: Medicaid Other | Source: Ambulatory Visit | Admitting: Obstetrics & Gynecology

## 2015-03-11 SURGERY — LIGATION, FALLOPIAN TUBE, LAPAROSCOPIC
Anesthesia: Choice | Site: Abdomen | Laterality: Bilateral

## 2015-05-15 ENCOUNTER — Ambulatory Visit: Payer: Medicaid Other

## 2015-05-30 ENCOUNTER — Ambulatory Visit: Payer: Medicaid Other

## 2015-06-24 ENCOUNTER — Encounter (HOSPITAL_COMMUNITY): Payer: Self-pay

## 2015-06-24 ENCOUNTER — Emergency Department (HOSPITAL_COMMUNITY): Payer: Medicaid Other

## 2015-06-24 ENCOUNTER — Emergency Department (HOSPITAL_COMMUNITY)
Admission: EM | Admit: 2015-06-24 | Discharge: 2015-06-24 | Disposition: A | Discharge status: Home / Self Care | Payer: Medicaid Other | Attending: Emergency Medicine | Admitting: Emergency Medicine

## 2015-06-24 DIAGNOSIS — M79672 Pain in left foot: Secondary | ICD-10-CM | POA: Diagnosis not present

## 2015-06-24 DIAGNOSIS — M7989 Other specified soft tissue disorders: Secondary | ICD-10-CM | POA: Diagnosis not present

## 2015-06-24 DIAGNOSIS — Z862 Personal history of diseases of the blood and blood-forming organs and certain disorders involving the immune mechanism: Secondary | ICD-10-CM | POA: Diagnosis not present

## 2015-06-24 DIAGNOSIS — Z8619 Personal history of other infectious and parasitic diseases: Secondary | ICD-10-CM | POA: Diagnosis not present

## 2015-06-24 DIAGNOSIS — I10 Essential (primary) hypertension: Secondary | ICD-10-CM | POA: Diagnosis not present

## 2015-06-24 DIAGNOSIS — F17210 Nicotine dependence, cigarettes, uncomplicated: Secondary | ICD-10-CM | POA: Diagnosis not present

## 2015-06-24 DIAGNOSIS — Z79899 Other long term (current) drug therapy: Secondary | ICD-10-CM | POA: Diagnosis not present

## 2015-06-24 DIAGNOSIS — Z8659 Personal history of other mental and behavioral disorders: Secondary | ICD-10-CM | POA: Insufficient documentation

## 2015-06-24 HISTORY — DX: Essential (primary) hypertension: I10

## 2015-06-24 MED ORDER — NAPROXEN 500 MG PO TABS: 500.0000 mg | ORAL_TABLET | Freq: Two times a day (BID) | ORAL | Status: DC

## 2015-06-24 MED ORDER — NAPROXEN 500 MG PO TABS
500.0000 mg | ORAL_TABLET | Freq: Once | ORAL | Status: AC
  Administered 2015-06-24: 500 mg via ORAL
  Filled 2015-06-24: qty 1

## 2015-06-24 NOTE — Discharge Instructions (Signed)

## 2015-06-24 NOTE — ED Notes (Signed)
Pt c/o intermittent L foot/ankle pain and swelling x 3 months.  Pain score 9/10.  Pt has not taken anything for pain.  Denies injury.  Slight swelling noted to L ankle.  Pt reports difficulty bending toes.

## 2015-06-24 NOTE — ED Provider Notes (Signed)
CSN: [id number]     Arrival date & time 06/24/15  1707 History  By signing my name below, I, Soijett Blue, attest that this documentation has been prepared under the direction and in the presence of Cheri FowlerKayla Ellie Bryand, PA-C Electronically Signed: Soijett Blue, ED Scribe. 06/24/2015. 6:58 PM.   Chief Complaint  Patient presents with  . Foot Pain  . Foot Swelling      The history is provided by the patient. No language interpreter was used.    Rene PaciQuendra L Molinari is a 25 y.o. female who presents to the Emergency Department complaining of left foot/ankle pain onset 2 days ago. Pt states that she injured her foot while ambulating up stairs. Pt denies having surgery to her foot in the past. She notes that her left foot pain is worsened with walking. Pt is having associated symptoms of left ankle swelling and gait problem due to pain. She notes that she has tried tylenol with no relief of her symptoms. She denies numbness, tingling, and any other symptoms.    Past Medical History  Diagnosis Date  . Chlamydia   . Headache   . History of anemia   . ADHD (attention deficit hyperactivity disorder)   . Hypertension    Past Surgical History  Procedure Laterality Date  . Induced abortion     Family History  Problem Relation Age of Onset  . Anesthesia problems Neg Hx   . Hypertension Mother   . Diabetes Sister    Social History  Substance Use Topics  . Smoking status: Current Every Day Smoker    Types: Cigars  . Smokeless tobacco: Never Used  . Alcohol Use: No   OB History    Gravida Para Term Preterm AB TAB SAB Ectopic Multiple Living   5 4 4  0 1 1 0 0 0 4     Review of Systems  Musculoskeletal: Positive for joint swelling, arthralgias and gait problem (due to pain).  Neurological: Negative for numbness.       No tingling  All other systems reviewed and are negative.     Allergies  Review of patient's allergies indicates no known allergies.  Home Medications   Prior to Admission  medications   Medication Sig Start Date End Date Taking? Authorizing Provider  amLODipine (NORVASC) 5 MG tablet Take 1 tablet (5 mg total) by mouth daily. 02/27/15   Willodean Rosenthalarolyn Harraway-Smith, MD  ibuprofen (ADVIL,MOTRIN) 800 MG tablet Take 800 mg by mouth every 8 (eight) hours as needed for moderate pain.    Historical Provider, MD  naproxen (NAPROSYN) 500 MG tablet Take 1 tablet (500 mg total) by mouth 2 (two) times daily. 06/24/15   Brynda Heick, PA-C   BP 145/85 mmHg  Pulse 74  Temp(Src) 98.5 F (36.9 C) (Oral)  Resp 16  SpO2 100%  LMP 06/21/2015 (Exact Date) Physical Exam  Constitutional: She is oriented to person, place, and time. She appears well-developed and well-nourished.  HENT:  Head: Normocephalic and atraumatic.  Right Ear: External ear normal.  Left Ear: External ear normal.  Eyes: Conjunctivae are normal. No scleral icterus.  Neck: No tracheal deviation present.  Cardiovascular:  Pulses:      Dorsalis pedis pulses are 2+ on the left side.  Pulmonary/Chest: Effort normal. No respiratory distress.  Abdominal: She exhibits no distension.  Musculoskeletal:       Left ankle: Normal.       Left foot: There is decreased range of motion (secondary to pain), tenderness and swelling (  mild). There is normal capillary refill and no deformity.  Neurological: She is alert and oriented to person, place, and time.  Sensation and strength intact bilaterally in lower extremities.   Skin: Skin is warm and dry.  Psychiatric: She has a normal mood and affect. Her behavior is normal.    ED Course  Procedures (including critical care time) DIAGNOSTIC STUDIES: Oxygen Saturation is 100% on RA, nl by my interpretation.    COORDINATION OF CARE: 6:58 PM Discussed treatment plan with pt at bedside which includes left foot xray, left ankle xray, naprosyn, and pt agreed to plan.    Labs Review Labs Reviewed - No data to display  Imaging Review Dg Ankle Complete Left  06/24/2015  CLINICAL  DATA:  Left ankle pain and swelling for 1 day. No known injury. Initial encounter. EXAM: LEFT ANKLE COMPLETE - 3+ VIEW COMPARISON:  None. FINDINGS: There is no evidence of fracture, dislocation, or joint effusion. There is no evidence of arthropathy or other focal bone abnormality. Soft tissues are unremarkable. IMPRESSION: Negative. Electronically Signed   By: Myles Rosenthal M.D.   On: 06/24/2015 18:32   Dg Foot Complete Left  06/24/2015  CLINICAL DATA:  Left foot pain and swelling for 1 day. No known injury. EXAM: LEFT FOOT - COMPLETE 3+ VIEW COMPARISON:  None. FINDINGS: There is no evidence of fracture or dislocation. There is no evidence of arthropathy or other focal bone abnormality. Soft tissues are unremarkable. IMPRESSION: Negative. Electronically Signed   By: Myles Rosenthal M.D.   On: 06/24/2015 18:33   I have personally reviewed and evaluated these images as part of my medical decision-making.   EKG Interpretation None      MDM   Final diagnoses:  Left foot pain    Patient X-Ray negative for obvious fracture or dislocation.  Pt advised to follow up with orthopedics. Pt discharged with naprosyn Rx. Patient given cam-walker boot while in ED, conservative therapy recommended and discussed. Patient will be discharged home & is agreeable with above plan. Returns precautions discussed. Pt appears safe for discharge.  I personally performed the services described in this documentation, which was scribed in my presence. The recorded information has been reviewed and is accurate.    Cheri Fowler, PA-C 06/24/15 2232  Mancel Bale, MD 06/25/15 1106

## 2015-08-05 ENCOUNTER — Encounter (HOSPITAL_COMMUNITY): Payer: Self-pay | Admitting: *Deleted

## 2015-08-05 ENCOUNTER — Emergency Department (HOSPITAL_COMMUNITY)
Admission: EM | Admit: 2015-08-05 | Discharge: 2015-08-06 | Disposition: A | Discharge status: Home / Self Care | Payer: Medicaid Other | Attending: Emergency Medicine | Admitting: Emergency Medicine

## 2015-08-05 DIAGNOSIS — I10 Essential (primary) hypertension: Secondary | ICD-10-CM | POA: Diagnosis not present

## 2015-08-05 DIAGNOSIS — Z79899 Other long term (current) drug therapy: Secondary | ICD-10-CM | POA: Diagnosis not present

## 2015-08-05 DIAGNOSIS — J029 Acute pharyngitis, unspecified: Secondary | ICD-10-CM | POA: Diagnosis present

## 2015-08-05 DIAGNOSIS — J020 Streptococcal pharyngitis: Secondary | ICD-10-CM | POA: Diagnosis not present

## 2015-08-05 DIAGNOSIS — F17210 Nicotine dependence, cigarettes, uncomplicated: Secondary | ICD-10-CM | POA: Insufficient documentation

## 2015-08-05 LAB — RAPID STREP SCREEN (MED CTR MEBANE ONLY): Streptococcus, Group A Screen (Direct): POSITIVE — AB

## 2015-08-05 MED ORDER — PENICILLIN G BENZATHINE 1200000 UNIT/2ML IM SUSP
1.2000 10*6.[IU] | Freq: Once | INTRAMUSCULAR | Status: AC
  Administered 2015-08-05: 1.2 10*6.[IU] via INTRAMUSCULAR
  Filled 2015-08-05: qty 2

## 2015-08-05 MED ORDER — DEXAMETHASONE SODIUM PHOSPHATE 10 MG/ML IJ SOLN
10.0000 mg | Freq: Once | INTRAMUSCULAR | Status: AC
  Administered 2015-08-05: 10 mg via INTRAMUSCULAR
  Filled 2015-08-05: qty 1

## 2015-08-05 MED ORDER — IBUPROFEN 100 MG/5ML PO SUSP
600.0000 mg | Freq: Once | ORAL | Status: AC
  Administered 2015-08-05: 600 mg via ORAL
  Filled 2015-08-05: qty 30

## 2015-08-05 NOTE — ED Provider Notes (Signed)
CSN: [id number]     Arrival date & time 08/05/15  2234 History  By signing my name below, I, Jasmyn B. Alexander, attest that this documentation has been prepared under the direction and in the presence of Cheri Fowler, PA-C.  Electronically Signed: Gillis Ends. Lyn Hollingshead, ED Scribe. 08/05/2015. 11:44 PM.    Chief Complaint  Patient presents with  . Sore Throat   The history is provided by the patient. No language interpreter was used.   HPI Comments: Monique Gamble is a 25 y.o. female with PMHx of HTN who presents to the Emergency Department complaining of gradual onset, constant sore throat x 1 day. Pt has associated chills. She has not taken any medications for symptoms PTA. She notes sick contact with daughter who is currently sick with similar symptoms. She denies any fever, nausea, vomiting, rhinorrhea, cough, or neck stiffness.   Past Medical History  Diagnosis Date  . Chlamydia   . Headache   . History of anemia   . ADHD (attention deficit hyperactivity disorder)   . Hypertension    Past Surgical History  Procedure Laterality Date  . Induced abortion     Family History  Problem Relation Age of Onset  . Anesthesia problems Neg Hx   . Hypertension Mother   . Diabetes Sister    Social History  Substance Use Topics  . Smoking status: Current Every Day Smoker    Types: Cigars  . Smokeless tobacco: Never Used  . Alcohol Use: No   OB History    Gravida Para Term Preterm AB TAB SAB Ectopic Multiple Living   0 1 1 0 0 0 4     Review of Systems  Constitutional: Positive for chills. Negative for fever.  HENT: Positive for sore throat. Negative for rhinorrhea.   Respiratory: Negative for cough.   Gastrointestinal: Negative for nausea and vomiting.  All other systems reviewed and are negative.  Allergies  Review of patient's allergies indicates no known allergies.  Home Medications   Prior to Admission medications   Medication Sig Start Date End Date Taking?  Authorizing Provider  amLODipine (NORVASC) 5 MG tablet Take 1 tablet (5 mg total) by mouth daily. 02/27/15   Willodean Rosenthal, MD  ibuprofen (CHILD IBUPROFEN) 100 MG/5ML suspension Take 30 mLs (600 mg total) by mouth every 4 (four) hours as needed. 08/06/15   Cheri Fowler, PA-C  lidocaine (XYLOCAINE) 2 % solution Use as directed 20 mLs in the mouth or throat as needed for mouth pain. 08/06/15   Cheri Fowler, PA-C  naproxen (NAPROSYN) 500 MG tablet Take 1 tablet (500 mg total) by mouth 2 (two) times daily. 06/24/15   Coda Mathey, PA-C   BP 135/101 mmHg  Pulse 111  Temp(Src) 98.6 F (37 C) (Oral)  Resp 18  Ht  (1.676 m)  Wt 77.622 kg  BMI 27.63 kg/m2  SpO2 100% Physical Exam  Constitutional: She is oriented to person, place, and time. She appears well-developed and well-nourished.  Non-toxic appearance. She does not have a sickly appearance. She does not appear ill.  HENT:  Head: Normocephalic and atraumatic.  Right Ear: Tympanic membrane and external ear normal.  Left Ear: Tympanic membrane and external ear normal.  Nose: Nose normal.  Mouth/Throat: Uvula is midline and mucous membranes are normal. No trismus in the jaw. Oropharyngeal exudate, posterior oropharyngeal edema and posterior oropharyngeal erythema present. No tonsillar abscesses.  Eyes: Conjunctivae are normal. Pupils are equal, round, and reactive to light.  Neck: Normal range of motion. Neck supple.  No nuchal rigidity.   Cardiovascular: Normal rate and regular rhythm.   Pulmonary/Chest: Effort normal and breath sounds normal. No accessory muscle usage or stridor. No respiratory distress. She has no wheezes. She has no rhonchi. She has no rales.  Abdominal: Soft. Bowel sounds are normal. She exhibits no distension. There is no tenderness.  Musculoskeletal: Normal range of motion.  Lymphadenopathy:    She has cervical adenopathy.  Neurological: She is alert and oriented to person, place, and time.  Speech clear without  dysarthria.  Skin: Skin is warm and dry.  Psychiatric: She has a normal mood and affect. Her behavior is normal.  Nursing note and vitals reviewed.   ED Course  Procedures (including critical care time) DIAGNOSTIC STUDIES: Oxygen Saturation is 100% on RA, normal by my interpretation.    COORDINATION OF CARE: 11:41 PM-Discussed treatment plan which includes IM Decadron, Lidocaine and IM Bicillin with pt at bedside and pt agreed to plan.   Labs Review Labs Reviewed  RAPID STREP SCREEN (NOT AT [facility]) - Abnormal; Notable for the following:    Streptococcus, Group A Screen (Direct) POSITIVE (*)    All other components within normal limits    MDM   Final diagnoses:  Strep pharyngitis   Patient presents with findings consistent with strep pharyngitis.  Treated with IM PCN and Decadron.  D/c home with ibuprofen and viscous lidocaine.  Patient able to tolerate PO intake without difficulty.  Discussed return precautions.  Patient agrees and acknowledges the above plan for discharge.   I personally performed the services described in this documentation, which was scribed in my presence. The recorded information has been reviewed and is accurate.    Cheri FowlerKayla Nevaya Nagele, PA-C 08/06/15 0009  Gilda Creasehristopher J Pollina, MD 08/07/15 [phone]

## 2015-08-05 NOTE — ED Notes (Signed)
Pt c/o sore throat since yesterday. Concerned that it may be strep.

## 2015-08-06 MED ORDER — LIDOCAINE VISCOUS 2 % MT SOLN: 20.0000 mL | OROMUCOSAL | Status: DC | PRN

## 2015-08-06 MED ORDER — IBUPROFEN 100 MG/5ML PO SUSP: 600.0000 mg | ORAL | Status: DC | PRN

## 2015-08-06 NOTE — ED Notes (Signed)
Patient verbalized understanding of discharge instructions and denies any further needs or questions at this time. VS stable. Patient ambulatory with steady gait, pt declined wheelchair - RN escorted patient to ED lobby.

## 2015-08-06 NOTE — Discharge Instructions (Signed)

## 2016-01-10 ENCOUNTER — Inpatient Hospital Stay (HOSPITAL_COMMUNITY)
Admission: AD | Admit: 2016-01-10 | Discharge: 2016-01-10 | Disposition: A | Discharge status: Home / Self Care | Payer: Medicaid Other | Source: Ambulatory Visit | Attending: Obstetrics & Gynecology | Admitting: Obstetrics & Gynecology

## 2016-01-10 ENCOUNTER — Encounter (HOSPITAL_COMMUNITY): Payer: Self-pay | Admitting: *Deleted

## 2016-01-10 DIAGNOSIS — M79604 Pain in right leg: Secondary | ICD-10-CM | POA: Diagnosis not present

## 2016-01-10 DIAGNOSIS — R109 Unspecified abdominal pain: Secondary | ICD-10-CM | POA: Diagnosis not present

## 2016-01-10 DIAGNOSIS — M79605 Pain in left leg: Secondary | ICD-10-CM | POA: Insufficient documentation

## 2016-01-10 DIAGNOSIS — F17210 Nicotine dependence, cigarettes, uncomplicated: Secondary | ICD-10-CM | POA: Insufficient documentation

## 2016-01-10 DIAGNOSIS — M549 Dorsalgia, unspecified: Secondary | ICD-10-CM | POA: Diagnosis not present

## 2016-01-10 DIAGNOSIS — Z0001 Encounter for general adult medical examination with abnormal findings: Secondary | ICD-10-CM | POA: Insufficient documentation

## 2016-01-10 DIAGNOSIS — O2341 Unspecified infection of urinary tract in pregnancy, first trimester: Secondary | ICD-10-CM

## 2016-01-10 DIAGNOSIS — N760 Acute vaginitis: Secondary | ICD-10-CM

## 2016-01-10 DIAGNOSIS — B9689 Other specified bacterial agents as the cause of diseases classified elsewhere: Secondary | ICD-10-CM

## 2016-01-10 DIAGNOSIS — R300 Dysuria: Secondary | ICD-10-CM | POA: Insufficient documentation

## 2016-01-10 LAB — WET PREP, GENITAL
SPERM: NONE SEEN
Trich, Wet Prep: NONE SEEN
Yeast Wet Prep HPF POC: NONE SEEN

## 2016-01-10 LAB — URINALYSIS, ROUTINE W REFLEX MICROSCOPIC
Bilirubin Urine: NEGATIVE
Glucose, UA: NEGATIVE mg/dL
Hgb urine dipstick: NEGATIVE
KETONES UR: NEGATIVE mg/dL
LEUKOCYTES UA: NEGATIVE
Nitrite: POSITIVE — AB
PROTEIN: NEGATIVE mg/dL
Specific Gravity, Urine: 1.02 (ref 1.005–1.030)
pH: 6.5 (ref 5.0–8.0)

## 2016-01-10 LAB — URINE MICROSCOPIC-ADD ON: RBC / HPF: NONE SEEN RBC/hpf (ref 0–5)

## 2016-01-10 LAB — POCT PREGNANCY, URINE: PREG TEST UR: POSITIVE — AB

## 2016-01-10 MED ORDER — METRONIDAZOLE 500 MG PO TABS: 500.0000 mg | ORAL_TABLET | Freq: Three times a day (TID) | ORAL | 0 refills | Status: AC

## 2016-01-10 MED ORDER — CEPHALEXIN 500 MG PO CAPS: 500.0000 mg | ORAL_CAPSULE | Freq: Three times a day (TID) | ORAL | 0 refills | Status: AC

## 2016-01-10 NOTE — MAU Provider Note (Signed)
History     CSN: [id number]  Arrival date and time: 01/10/16 1310   None     Chief Complaint  Patient presents with  . Back Pain  . Leg Pain   Abdominal Pain  This is a new problem. The current episode started in the past 7 days. The onset quality is sudden. The problem occurs intermittently. The problem has been unchanged. The pain is mild. The quality of the pain is burning. The abdominal pain radiates to the back. Associated symptoms include dysuria. Pertinent negatives include no anorexia, arthralgias, belching, constipation, diarrhea, fever, flatus, frequency, headaches, hematochezia, hematuria, melena, myalgias, nausea, vomiting or weight loss. Nothing aggravates the pain. The pain is relieved by nothing. She has tried acetaminophen for the symptoms. The treatment provided no relief.  Dysuria   This is a new problem. The current episode started in the past 7 days. The problem occurs intermittently. The problem has been unchanged. The quality of the pain is described as burning. There has been no fever. She is sexually active. There is no history of pyelonephritis. Pertinent negatives include no chills, discharge, flank pain, frequency, hematuria, hesitancy, nausea, sweats, urgency or vomiting. She has tried acetaminophen for the symptoms. The treatment provided no relief. There is no history of catheterization, kidney stones, recurrent UTIs, a single kidney, urinary stasis or a urological procedure.   Her symptoms are similar to those of past UTIs.  OB History    Gravida Para Term Preterm AB Living   6 4 4  0 1 4   SAB TAB Ectopic Multiple Live Births   0 1 0 0 4        Past Medical History:  Diagnosis Date  . ADHD (attention deficit hyperactivity disorder)   . Chlamydia   . Headache   . History of anemia   . Hypertension     Past Surgical History:  Procedure Laterality Date  . INDUCED ABORTION      Family History  Problem Relation Age of Onset  . Hypertension  Mother   . Diabetes Sister   . Anesthesia problems Neg Hx     Social History  Substance Use Topics  . Smoking status: Current Every Day Smoker    Types: Cigars  . Smokeless tobacco: Never Used  . Alcohol use No    Allergies: No Known Allergies  No prescriptions prior to admission.    Review of Systems  Constitutional: Negative for chills, fever and weight loss.  HENT: Negative.   Eyes: Negative.   Respiratory: Negative.   Cardiovascular: Negative.   Gastrointestinal: Positive for abdominal pain. Negative for anorexia, constipation, diarrhea, flatus, hematochezia, melena, nausea and vomiting.  Genitourinary: Positive for dysuria. Negative for flank pain, frequency, hematuria, hesitancy and urgency.  Musculoskeletal: Negative for arthralgias and myalgias.  Skin: Negative.   Neurological: Negative for headaches.  Endo/Heme/Allergies: Negative.   Psychiatric/Behavioral: Negative.    Physical Exam   Blood pressure 134/81, pulse 96, temperature 98.7 F (37.1 C), temperature source Oral, resp. rate 16, height 5\' 5"  (1.651 m), weight 75.3 kg (166 lb), last menstrual period 12/02/2015, not currently breastfeeding.  Physical Exam  Constitutional: She is oriented to person, place, and time. She appears well-developed.  HENT:  Head: Normocephalic and atraumatic.  Neck: Normal range of motion.  Respiratory: Effort normal. No respiratory distress. She exhibits no tenderness.  GI: Soft. She exhibits no distension and no mass. There is no tenderness. There is no rebound and no guarding.  No suprapubic tenderness on exam.  Genitourinary: Vagina normal and uterus normal. Rectal exam shows guaiac negative stool. No vaginal discharge found.  Genitourinary Comments: Labia are pink and non-erythematous, no lesions. On exam cervix is pink and non=erythematous with no lesions. Milky white vaginal discharge in the vagina; no odor and non-adherent to vaginal walls.  No CMT, uterine size equals  dates.   Musculoskeletal: Normal range of motion.  No CVA tenderness.   Neurological: She is alert and oriented to person, place, and time.  Skin: Skin is warm and dry.  Psychiatric: She has a normal mood and affect.    MAU Course  Procedures  MDM -Urine culture -UA -Wet prep-clue cells present -GC/CT cultures -bimanual and speculum exam  Assessment and Plan  Patient Monique Gamble is a 25 year old [id number] here with complaints of suprapubic pain, lower backpain and pain that radiates down her legs. She has occasional dysuria. Wet prep shows clue cells; GC and CT and urine cultures are sent.  Patient stable for discharge with prescription for flagyl and keflex. She will call the clinic to make an appointment.  Patient discussed with Dr. Despina HiddenEure, whom agrees with the plan of care.   Charlesetta GaribaldiKathryn Lorraine Almond Fitzgibbon CNM 01/10/2016, 3:42 PM

## 2016-01-10 NOTE — Discharge Instructions (Signed)
Pregnancy and Urinary Tract Infection °WHAT IS A URINARY TRACT INFECTION? °A urinary tract infection (UTI) is an infection of any part of the urinary tract. This includes the kidneys, the tubes that connect your kidneys to your bladder (ureters), the bladder, and the tube that carries urine out of your body (urethra). These organs make, store, and get rid of urine in the body. A UTI can be a bladder infection (cystitis) or a kidney infection (pyelonephritis). This infection may be caused by fungi, viruses, and bacteria. Bacteria are the most common cause of UTIs. °You are more likely to develop a UTI during pregnancy because: °· The physical and hormonal changes your body goes through can make it easier for bacteria to get into your urinary tract. °· Your growing baby puts pressure on your uterus and can affect urine flow. °DOES A UTI PLACE MY BABY AT RISK? °An untreated UTI during pregnancy could lead to a kidney infection, which can cause health problems that could affect your baby. Possible complications of an untreated UTI include: °· Having your baby before 37 weeks of pregnancy (premature). °· Having a baby with a low birth weight. °· Developing high blood pressure during pregnancy (preeclampsia). °WHAT ARE THE SYMPTOMS OF A UTI? °Symptoms of a UTI include: °· Fever. °· Frequent urination or passing small amounts of urine frequently. °· Needing to urinate urgently. °· Pain or a burning sensation with urination. °· Urine that smells bad or unusual. °· Cloudy urine. °· Pain in the lower abdomen or back. °· Trouble urinating. °· Blood in the urine. °· Vomiting or being less hungry than normal. °· Diarrhea or abdominal pain. °· Vaginal discharge. °WHAT ARE THE TREATMENT OPTIONS FOR A UTI DURING PREGNANCY? °Treatment for this condition may include: °· Antibiotic medicines that are safe to take during pregnancy. °· Other medicines to treat less common causes of UTI. °HOW CAN I PREVENT A UTI? °To prevent a UTI: °· Go  to the bathroom as soon as you feel the need. °· Always wipe from front to back. °· Wash your genital area with soap and warm water daily. °· Empty your bladder before and after sex. °· Wear cotton underwear. °· Limit your intake of high sugar foods or drinks, such as regular soda, juice, and sweets.. °· Drink 6-8 glasses of water daily. °· Do not wear tight-fitting pants. °· Do not douche or use deodorant sprays. °· Do not drink alcohol, caffeine, or carbonated drinks. These can irritate the bladder. °WHEN SHOULD I SEEK MEDICAL CARE? °Seek medical care if: °· Your symptoms do not improve or get worse. °· You have a fever after two days of treatment. °· You have a rash. °· You have abnormal vaginal discharge. °· You have back or side pain. °· You have chills. °· You have nausea and vomiting. °WHEN SHOULD I SEEK IMMEDIATE MEDICAL CARE? °Seek immediate medical care if you are pregnant and: °· You feel contractions in your uterus. °· You have lower belly pain. °· You have a gush of fluid from your vagina. °· You have blood in your urine. °· You are vomiting and cannot keep down any medicines or water. °This information is not intended to replace advice given to you by your health care provider. Make sure you discuss any questions you have with your health care provider. °Document Released: 05/30/2010 Document Revised: 07/08/2015 Document Reviewed: 12/24/2014 °Elsevier Interactive Patient Education © 2017 [employer] ° °

## 2016-01-10 NOTE — MAU Note (Signed)
Pt thinks she may have a UTI, having pain in lower back & legs x 3 days, denies dysuria. No vomiting or diarrhea, no fever.

## 2016-01-12 LAB — URINE CULTURE

## 2016-01-15 LAB — GC/CHLAMYDIA PROBE AMP (~~LOC~~) NOT AT ARMC
CHLAMYDIA, DNA PROBE: NEGATIVE
NEISSERIA GONORRHEA: NEGATIVE

## 2016-02-12 ENCOUNTER — Encounter (HOSPITAL_COMMUNITY): Payer: Self-pay

## 2016-02-12 ENCOUNTER — Inpatient Hospital Stay (HOSPITAL_COMMUNITY)
Admission: AD | Admit: 2016-02-12 | Discharge: 2016-02-12 | Disposition: A | Discharge status: Home / Self Care | Payer: Medicaid Other | Source: Ambulatory Visit | Attending: Family Medicine | Admitting: Family Medicine

## 2016-02-12 DIAGNOSIS — Z3A10 10 weeks gestation of pregnancy: Secondary | ICD-10-CM | POA: Insufficient documentation

## 2016-02-12 DIAGNOSIS — O99331 Smoking (tobacco) complicating pregnancy, first trimester: Secondary | ICD-10-CM | POA: Insufficient documentation

## 2016-02-12 DIAGNOSIS — O210 Mild hyperemesis gravidarum: Secondary | ICD-10-CM | POA: Insufficient documentation

## 2016-02-12 DIAGNOSIS — F17290 Nicotine dependence, other tobacco product, uncomplicated: Secondary | ICD-10-CM | POA: Insufficient documentation

## 2016-02-12 DIAGNOSIS — R51 Headache: Secondary | ICD-10-CM | POA: Diagnosis not present

## 2016-02-12 DIAGNOSIS — O219 Vomiting of pregnancy, unspecified: Secondary | ICD-10-CM

## 2016-02-12 DIAGNOSIS — R519 Headache, unspecified: Secondary | ICD-10-CM

## 2016-02-12 DIAGNOSIS — O26891 Other specified pregnancy related conditions, first trimester: Secondary | ICD-10-CM

## 2016-02-12 LAB — COMPREHENSIVE METABOLIC PANEL
ALK PHOS: 62 U/L (ref 38–126)
ALT: 7 U/L — AB (ref 14–54)
AST: 27 U/L (ref 15–41)
Albumin: 3.6 g/dL (ref 3.5–5.0)
Anion gap: 8 (ref 5–15)
BILIRUBIN TOTAL: 1.8 mg/dL — AB (ref 0.3–1.2)
BUN: <5 mg/dL — ABNORMAL LOW (ref 6–20)
CALCIUM: 9.1 mg/dL (ref 8.9–10.3)
CO2: 23 mmol/L (ref 22–32)
CREATININE: 0.62 mg/dL (ref 0.44–1.00)
Chloride: 102 mmol/L (ref 101–111)
Glucose, Bld: 80 mg/dL (ref 65–99)
Potassium: 4.6 mmol/L (ref 3.5–5.1)
Sodium: 133 mmol/L — ABNORMAL LOW (ref 135–145)
Total Protein: 7.9 g/dL (ref 6.5–8.1)

## 2016-02-12 LAB — URINALYSIS, ROUTINE W REFLEX MICROSCOPIC
BILIRUBIN URINE: NEGATIVE
Glucose, UA: NEGATIVE mg/dL
HGB URINE DIPSTICK: NEGATIVE
KETONES UR: NEGATIVE mg/dL
LEUKOCYTES UA: NEGATIVE
NITRITE: NEGATIVE
PROTEIN: 30 mg/dL — AB
SPECIFIC GRAVITY, URINE: 1.017 (ref 1.005–1.030)
pH: 6 (ref 5.0–8.0)

## 2016-02-12 MED ORDER — ACETAMINOPHEN 500 MG PO TABS
1000.0000 mg | ORAL_TABLET | Freq: Once | ORAL | Status: AC
  Administered 2016-02-12: 1000 mg via ORAL
  Filled 2016-02-12: qty 2

## 2016-02-12 MED ORDER — PROMETHAZINE HCL 25 MG PO TABS: 25.0000 mg | ORAL_TABLET | Freq: Four times a day (QID) | ORAL | 0 refills | Status: DC | PRN

## 2016-02-12 MED ORDER — LACTATED RINGERS IV BOLUS (SEPSIS)
1000.0000 mL | Freq: Once | INTRAVENOUS | Status: AC
  Administered 2016-02-12: 1000 mL via INTRAVENOUS

## 2016-02-12 MED ORDER — PROMETHAZINE HCL 25 MG/ML IJ SOLN
25.0000 mg | Freq: Once | INTRAMUSCULAR | Status: AC
  Administered 2016-02-12: 25 mg via INTRAVENOUS
  Filled 2016-02-12: qty 1

## 2016-02-12 MED ORDER — RANITIDINE HCL 150 MG PO TABS: 150.0000 mg | ORAL_TABLET | Freq: Two times a day (BID) | ORAL | 0 refills | Status: DC

## 2016-02-12 MED ORDER — FAMOTIDINE IN NACL 20-0.9 MG/50ML-% IV SOLN
20.0000 mg | Freq: Once | INTRAVENOUS | Status: AC
  Administered 2016-02-12: 20 mg via INTRAVENOUS
  Filled 2016-02-12: qty 50

## 2016-02-12 NOTE — MAU Provider Note (Signed)
History     CSN: [id number]  Arrival date and time: 02/12/16 1637    First Provider Initiated Contact with Patient 02/12/16 1742        Chief Complaint  Patient presents with  . Emesis During Pregnancy  . Headache   HPI Rene PaciQuendra L Zech is a 25 y.o. [id number] at [id number] by LMP who presents with nausea/vomiting & headache. Nausea & vomiting worse for the last 3 days. Vomited twice today; this morning was blood streaked. States hasn't been able to keep down food of fluids today. Also has headache today. Rates pain 7/10. Has not treated pain. Nothing makes better or worse. Denies abdominal pain, vaginal bleeding, heartburn, diarrhea, or constipation.   OB History    Gravida Para Term Preterm AB Living   6 4 4  0 1 4   SAB TAB Ectopic Multiple Live Births   0 1 0 0 4      Past Medical History:  Diagnosis Date  . ADHD (attention deficit hyperactivity disorder)   . Chlamydia   . Headache   . History of anemia   . Hypertension     Past Surgical History:  Procedure Laterality Date  . INDUCED ABORTION      Family History  Problem Relation Age of Onset  . Hypertension Mother   . Diabetes Sister   . Anesthesia problems Neg Hx     Social History  Substance Use Topics  . Smoking status: Current Every Day Smoker    Types: Cigars  . Smokeless tobacco: Never Used  . Alcohol use No    Allergies: No Known Allergies  Prescriptions Prior to Admission  Medication Sig Dispense Refill Last Dose  . ibuprofen (ADVIL,MOTRIN) 200 MG tablet Take 200 mg by mouth every 6 (six) hours as needed for mild pain.   02/11/2016 at Unknown time    Review of Systems  Constitutional: Negative.   Gastrointestinal: Positive for nausea and vomiting. Negative for abdominal pain, constipation, diarrhea and heartburn.  Genitourinary: Negative.   Neurological: Positive for headaches.   Physical Exam   Blood pressure 135/79, pulse 98, temperature 98.4 F (36.9 C), temperature source Oral, resp.  rate 16, weight 163 lb 9.6 oz (74.2 kg), last menstrual period 12/02/2015, not currently breastfeeding.  Physical Exam  Nursing note and vitals reviewed. Constitutional: She is oriented to person, place, and time. She appears well-developed and well-nourished. No distress.  HENT:  Head: Normocephalic and atraumatic.  Eyes: Conjunctivae are normal. Right eye exhibits no discharge. Left eye exhibits no discharge. No scleral icterus.  Neck: Normal range of motion.  Cardiovascular: Normal rate, regular rhythm and normal heart sounds.   No murmur heard. Respiratory: Effort normal and breath sounds normal. No respiratory distress. She has no wheezes.  GI: Soft. Bowel sounds are normal. She exhibits no distension. There is no tenderness. There is no rebound and no guarding.  Neurological: She is alert and oriented to person, place, and time.  Skin: Skin is warm and dry. She is not diaphoretic.  Psychiatric: She has a normal mood and affect. Her behavior is normal. Judgment and thought content normal.    MAU Course  Procedures Results for orders placed or performed during the hospital encounter of 02/12/16 (from the past 24 hour(s))  Urinalysis, Routine w reflex microscopic     Status: Abnormal   Collection Time: 02/12/16  5:08 PM  Result Value Ref Range   Color, Urine YELLOW YELLOW   APPearance HAZY (A) CLEAR   Specific Gravity,  Urine 1.017 1.005 - 1.030   pH 6.0 5.0 - 8.0   Glucose, UA NEGATIVE NEGATIVE mg/dL   Hgb urine dipstick NEGATIVE NEGATIVE   Bilirubin Urine NEGATIVE NEGATIVE   Ketones, ur NEGATIVE NEGATIVE mg/dL   Protein, ur 30 (A) NEGATIVE mg/dL   Nitrite NEGATIVE NEGATIVE   Leukocytes, UA NEGATIVE NEGATIVE   RBC / HPF 0-5 0 - 5 RBC/hpf   WBC, UA 0-5 0 - 5 WBC/hpf   Bacteria, UA RARE (A) NONE SEEN   Squamous Epithelial / LPF 6-30 (A) NONE SEEN   Mucous PRESENT   Comprehensive metabolic panel     Status: Abnormal   Collection Time: 02/12/16  6:20 PM  Result Value Ref  Range   Sodium 133 (L) 135 - 145 mmol/L   Potassium 4.6 3.5 - 5.1 mmol/L   Chloride 102 101 - 111 mmol/L   CO2 23 22 - 32 mmol/L   Glucose, Bld 80 65 - 99 mg/dL   BUN <5 (L) 6 - 20 mg/dL   Creatinine, Ser 1.610.62 0.44 - 1.00 mg/dL   Calcium 9.1 8.9 - 09.610.3 mg/dL   Total Protein 7.9 6.5 - 8.1 g/dL   Albumin 3.6 3.5 - 5.0 g/dL   AST 27 15 - 41 U/L   ALT 7 (L) 14 - 54 U/L   Alkaline Phosphatase 62 38 - 126 U/L   Total Bilirubin 1.8 (H) 0.3 - 1.2 mg/dL   GFR calc non Af Amer >60 >60 mL/min   GFR calc Af Amer >60 >60 mL/min   Anion gap 8 5 - 15    MDM IV fluid bolus, D5LR Phenergan 25 mg IV, Pepcid 20 mg IV Tylenol 1 gm PO Pt reports improvement in symptoms & not observed vomiting while in MAU  Assessment and Plan  A: 1. Nausea and vomiting during pregnancy prior to [redacted] weeks gestation   2. Headache in pregnancy, antepartum, first trimester    P: Discharge home Rx zantac & phenergan Outpatient ultrasound ordered for viability Start prenatal care -- [facility]  Discussed reasons to return to MAU  Judeth HornErin Brightyn Mozer 02/12/2016, 5:41 PM

## 2016-02-12 NOTE — Discharge Instructions (Signed)
Morning Sickness °Morning sickness is when you feel sick to your stomach (nauseous) during pregnancy. This nauseous feeling may or may not come with vomiting. It often occurs in the morning but can be a problem any time of day. Morning sickness is most common during the first trimester, but it may continue throughout pregnancy. While morning sickness is unpleasant, it is usually harmless unless you develop severe and continual vomiting (hyperemesis gravidarum). This condition requires more intense treatment. °What are the causes? °The cause of morning sickness is not completely known but seems to be related to normal hormonal changes that occur in pregnancy. °What increases the risk? °You are at greater risk if you: °· Experienced nausea or vomiting before your pregnancy. °· Had morning sickness during a previous pregnancy. °· Are pregnant with more than one baby, such as twins. ° °How is this treated? °Do not use any medicines (prescription, over-the-counter, or herbal) for morning sickness without first talking to your health care provider. Your health care provider may prescribe or recommend: °· Vitamin B6 supplements. °· Anti-nausea medicines. °· The herbal medicine ginger. ° °Follow these instructions at home: °· Only take over-the-counter or prescription medicines as directed by your health care provider. °· Taking multivitamins before getting pregnant can prevent or decrease the severity of morning sickness in most women. °· Eat a piece of dry toast or unsalted crackers before getting out of bed in the morning. °· Eat five or six small meals a day. °· Eat dry and bland foods (rice, baked potato). Foods high in carbohydrates are often helpful. °· Do not drink liquids with your meals. Drink liquids between meals. °· Avoid greasy, fatty, and spicy foods. °· Get someone to cook for you if the smell of any food causes nausea and vomiting. °· If you feel nauseous after taking prenatal vitamins, take the vitamins at  night or with a snack. °· Snack on protein foods (nuts, yogurt, cheese) between meals if you are hungry. °· Eat unsweetened gelatins for desserts. °· Wearing an acupressure wristband (worn for sea sickness) may be helpful. °· Acupuncture may be helpful. °· Do not smoke. °· Get a humidifier to keep the air in your house free of odors. °· Get plenty of fresh air. °Contact a health care provider if: °· Your home remedies are not working, and you need medicine. °· You feel dizzy or lightheaded. °· You are losing weight. °Get help right away if: °· You have persistent and uncontrolled nausea and vomiting. °· You pass out (faint). °This information is not intended to replace advice given to you by your health care provider. Make sure you discuss any questions you have with your health care provider. °Document Released: 03/26/2006 Document Revised: 07/11/2015 Document Reviewed: 07/20/2012 °Elsevier Interactive Patient Education © 2017 [employer]. ° °

## 2016-02-12 NOTE — MAU Note (Signed)
Been throwing up, has seen blood in it at times.  Not keeping anything down.  Is starving.  Body is aching.  Can't do nothing with other kids. Having major headaches. Is having cramping in stomach. Had loose stools last night, none since

## 2016-02-17 NOTE — L&D Delivery Note (Signed)
Delivery Note At 8:47 AM a viable female was delivered via Vaginal, Spontaneous Delivery (Presentation: ;  ).  APGAR: 9, 9; weight  pending.   Placenta status: delviered intact with gentle traction.  Cord: 3 vessel with the following complications: nuchal x2 .  Cord pH: not collected  Anesthesia:  none Episiotomy: None Lacerations: labial  Est. Blood Loss (mL): 100  Mom to postpartum.  Baby to Couplet care / Skin to Skin.  Ernestina Pennaicholas Schenk 08/22/2016, 9:01 AM

## 2016-02-18 ENCOUNTER — Ambulatory Visit (HOSPITAL_COMMUNITY): Payer: Medicaid Other | Attending: Student

## 2016-06-01 ENCOUNTER — Encounter: Payer: Medicaid Other | Admitting: Obstetrics

## 2016-06-11 ENCOUNTER — Inpatient Hospital Stay (HOSPITAL_COMMUNITY)
Admission: AD | Admit: 2016-06-11 | Discharge: 2016-06-11 | Disposition: A | Discharge status: Home / Self Care | Payer: Medicaid Other | Source: Ambulatory Visit | Attending: Obstetrics & Gynecology | Admitting: Obstetrics & Gynecology

## 2016-06-11 ENCOUNTER — Encounter (HOSPITAL_COMMUNITY): Payer: Self-pay

## 2016-06-11 DIAGNOSIS — O26892 Other specified pregnancy related conditions, second trimester: Secondary | ICD-10-CM

## 2016-06-11 DIAGNOSIS — B3731 Acute candidiasis of vulva and vagina: Secondary | ICD-10-CM

## 2016-06-11 DIAGNOSIS — R109 Unspecified abdominal pain: Secondary | ICD-10-CM | POA: Diagnosis not present

## 2016-06-11 DIAGNOSIS — Z3A27 27 weeks gestation of pregnancy: Secondary | ICD-10-CM | POA: Diagnosis not present

## 2016-06-11 DIAGNOSIS — Z363 Encounter for antenatal screening for malformations: Secondary | ICD-10-CM | POA: Diagnosis not present

## 2016-06-11 DIAGNOSIS — B373 Candidiasis of vulva and vagina: Secondary | ICD-10-CM

## 2016-06-11 DIAGNOSIS — O26899 Other specified pregnancy related conditions, unspecified trimester: Secondary | ICD-10-CM

## 2016-06-11 DIAGNOSIS — R110 Nausea: Secondary | ICD-10-CM

## 2016-06-11 DIAGNOSIS — O0932 Supervision of pregnancy with insufficient antenatal care, second trimester: Secondary | ICD-10-CM

## 2016-06-11 DIAGNOSIS — O98812 Other maternal infectious and parasitic diseases complicating pregnancy, second trimester: Secondary | ICD-10-CM | POA: Diagnosis not present

## 2016-06-11 LAB — URINALYSIS, ROUTINE W REFLEX MICROSCOPIC
BILIRUBIN URINE: NEGATIVE
Glucose, UA: NEGATIVE mg/dL
HGB URINE DIPSTICK: NEGATIVE
Ketones, ur: NEGATIVE mg/dL
Leukocytes, UA: NEGATIVE
NITRITE: NEGATIVE
PROTEIN: NEGATIVE mg/dL
Specific Gravity, Urine: 1.018 (ref 1.005–1.030)
pH: 7 (ref 5.0–8.0)

## 2016-06-11 LAB — WET PREP, GENITAL
Clue Cells Wet Prep HPF POC: NONE SEEN
Sperm: NONE SEEN
TRICH WET PREP: NONE SEEN

## 2016-06-11 MED ORDER — PROMETHAZINE HCL 25 MG PO TABS: 25.0000 mg | ORAL_TABLET | Freq: Four times a day (QID) | ORAL | 0 refills | Status: DC | PRN

## 2016-06-11 MED ORDER — TERCONAZOLE 0.8 % VA CREA: 1.0000 | TOPICAL_CREAM | Freq: Every day | VAGINAL | 0 refills | Status: DC

## 2016-06-11 NOTE — Discharge Instructions (Signed)
. °Preterm Labor and Birth Information °The normal length of a pregnancy is 39-41 weeks. Preterm labor is when labor starts before 37 completed weeks of pregnancy. °What are the risk factors for preterm labor? °Preterm labor is more likely to occur in women who: °· Have certain infections during pregnancy such as a bladder infection, sexually transmitted infection, or infection inside the uterus (chorioamnionitis). °· Have a shorter-than-normal cervix. °· Have gone into preterm labor before. °· Have had surgery on their cervix. °· Are younger than age 17 or older than age 35. °· Are African American. °· Are pregnant with twins or multiple babies (multiple gestation). °· Take street drugs or smoke while pregnant. °· Do not gain enough weight while pregnant. °· Became pregnant shortly after having been pregnant. °What are the symptoms of preterm labor? °Symptoms of preterm labor include: °· Cramps similar to those that can happen during a menstrual period. The cramps may happen with diarrhea. °· Pain in the abdomen or lower back. °· Regular uterine contractions that may feel like tightening of the abdomen. °· A feeling of increased pressure in the pelvis. °· Increased watery or bloody mucus discharge from the vagina. °· Water breaking (ruptured amniotic sac). °Why is it important to recognize signs of preterm labor? °It is important to recognize signs of preterm labor because babies who are born prematurely may not be fully developed. This can put them at an increased risk for: °· Long-term (chronic) heart and lung problems. °· Difficulty immediately after birth with regulating body systems, including blood sugar, body temperature, heart rate, and breathing rate. °· Bleeding in the brain. °· Cerebral palsy. °· Learning difficulties. °· Death. °These risks are highest for babies who are born before 34 weeks of pregnancy. °How is preterm labor treated? °Treatment depends on the length of your pregnancy, your condition,  and the health of your baby. It may involve: °· Having a stitch (suture) placed in your cervix to prevent your cervix from opening too early (cerclage). °· Taking or being given medicines, such as: °¨ Hormone medicines. These may be given early in pregnancy to help support the pregnancy. °¨ Medicine to stop contractions. °¨ Medicines to help mature the baby’s lungs. These may be prescribed if the risk of delivery is high. °¨ Medicines to prevent your baby from developing cerebral palsy. °If the labor happens before 34 weeks of pregnancy, you may need to stay in the hospital. °What should I do if I think I am in preterm labor? °If you think that you are going into preterm labor, call your health care provider right away. °How can I prevent preterm labor in future pregnancies? °To increase your chance of having a full-term pregnancy: °· Do not use any tobacco products, such as cigarettes, chewing tobacco, and e-cigarettes. If you need help quitting, ask your health care provider. °· Do not use street drugs or medicines that have not been prescribed to you during your pregnancy. °· Talk with your health care provider before taking any herbal supplements, even if you have been taking them regularly. °· Make sure you gain a healthy amount of weight during your pregnancy. °· Watch for infection. If you think that you might have an infection, get it checked right away. °· Make sure to tell your health care provider if you have gone into preterm labor before. °This information is not intended to replace advice given to you by your health care provider. Make sure you discuss any questions you have with your   care provider. °Document Released: 04/25/2003 Document Revised: 07/16/2015 Document Reviewed: 06/26/2015 °Elsevier Interactive Patient Education © 2017 [employer] ° ° °Vaginal Yeast infection, Adult °Vaginal yeast infection is a condition that causes soreness, swelling, and redness (inflammation) of the vagina. It  also causes vaginal discharge. This is a common condition. Some women get this infection frequently. °What are the causes? °This condition is caused by a change in the normal balance of the yeast (candida) and bacteria that live in the vagina. This change causes an overgrowth of yeast, which causes the inflammation. °What increases the risk? °This condition is more likely to develop in: °· Women who take antibiotic medicines. °· Women who have diabetes. °· Women who take birth control pills. °· Women who are pregnant. °· Women who douche often. °· Women who have a weak defense (immune) system. °· Women who have been taking steroid medicines for a long time. °· Women who frequently wear tight clothing. °What are the signs or symptoms? °Symptoms of this condition include: °· White, thick vaginal discharge. °· Swelling, itching, redness, and irritation of the vagina. The lips of the vagina (vulva) may be affected as well. °· Pain or a burning feeling while urinating. °· Pain during sex. °How is this diagnosed? °This condition is diagnosed with a medical history and physical exam. This will include a pelvic exam. Your health care provider will examine a sample of your vaginal discharge under a microscope. Your health care provider may send this sample for testing to confirm the diagnosis. °How is this treated? °This condition is treated with medicine. Medicines may be over-the-counter or prescription. You may be told to use one or more of the following: °· Medicine that is taken orally. °· Medicine that is applied as a cream. °· Medicine that is inserted directly into the vagina (suppository). °Follow these instructions at home: °· Take or apply over-the-counter and prescription medicines only as told by your health care provider. °· Do not have sex until your health care provider has approved. Tell your sex partner that you have a yeast infection. That person should go to his or her health care provider if he or she  develops symptoms. °· Do not wear tight clothes, such as pantyhose or tight pants. °· Avoid using tampons until your health care provider approves. °· Eat more yogurt. This may help to keep your yeast infection from returning. °· Try taking a sitz bath to help with discomfort. This is a warm water bath that is taken while you are sitting down. The water should only come up to your hips and should cover your buttocks. Do this 3-4 times per day or as told by your health care provider. °· Do not douche. °· Wear breathable, cotton underwear. °· If you have diabetes, keep your blood sugar levels under control. °Contact a health care provider if: °· You have a fever. °· Your symptoms go away and then return. °· Your symptoms do not get better with treatment. °· Your symptoms get worse. °· You have new symptoms. °· You develop blisters in or around your vagina. °· You have blood coming from your vagina and it is not your menstrual period. °· You develop pain in your abdomen. °This information is not intended to replace advice given to you by your health care provider. Make sure you discuss any questions you have with your health care provider. °Document Released: 11/12/2004 Document Revised: 07/17/2015 Document Reviewed: 08/06/2014 °Elsevier Interactive Patient Education © 2017 [employer] Inc[employer] ° ° °

## 2016-06-11 NOTE — MAU Provider Note (Signed)
History     CSN: [id number]  Arrival date and time: 06/11/16 1615  First Provider Initiated Contact with Patient 06/11/16 1710      Chief Complaint  Patient presents with  . Abdominal Cramping  . Emesis   HPI Monique Gamble is a 26 y.o. [id number] at [redacted]w[redacted]d who presents with abdominal cramping. Symptoms began 3 days ago. Reports constant lower abdominal cramping that she rates 6/10. Has not treated. Nothing makes better or worse. Denies dysuria, vaginal bleeding, LOF, vaginal discharge, diarrhea or constipation. Endorses nausea & vomiting throughout pregnancy that has been well controlled with promethazine but she ran out of her medication. Denies symptoms today. No recent intercourse. Has not had prenatal care this pregnancy. Positive fetal movement.   OB History    Gravida Para Term Preterm AB Living   0 1 4   SAB TAB Ectopic Multiple Live Births   0 1 0 0 4      Past Medical History:  Diagnosis Date  . ADHD (attention deficit hyperactivity disorder)   . Chlamydia   . Headache   . History of anemia   . Hypertension     Past Surgical History:  Procedure Laterality Date  . INDUCED ABORTION      Family History  Problem Relation Age of Onset  . Hypertension Mother   . Diabetes Sister   . Anesthesia problems Neg Hx     Social History  Substance Use Topics  . Smoking status: Current Every Day Smoker    Types: Cigars  . Smokeless tobacco: Never Used  . Alcohol use No    Allergies: No Known Allergies  Prescriptions Prior to Admission  Medication Sig Dispense Refill Last Dose  . promethazine (PHENERGAN) 25 MG tablet Take 1 tablet (25 mg total) by mouth every 6 (six) hours as needed for nausea or vomiting. 30 tablet 0   . ranitidine (ZANTAC) 150 MG tablet Take 1 tablet (150 mg total) by mouth 2 (two) times daily. 60 tablet 0     Review of Systems  Constitutional: Negative.   Gastrointestinal: Positive for abdominal pain, nausea and vomiting. Negative for  constipation and diarrhea.  Genitourinary: Negative.    Physical Exam   Blood pressure 131/63, pulse 93, temperature 98.5 F (36.9 C), resp. rate 18, height  (1.676 m), weight 181 lb 6.4 oz (82.3 kg), last menstrual period 12/02/2015, not currently breastfeeding.  Physical Exam  Nursing note and vitals reviewed. Constitutional: She is oriented to person, place, and time. She appears well-developed and well-nourished. No distress.  HENT:  Head: Normocephalic and atraumatic.  Eyes: Conjunctivae are normal. Right eye exhibits no discharge. Left eye exhibits no discharge. No scleral icterus.  Neck: Normal range of motion.  Cardiovascular: Normal rate, regular rhythm and normal heart sounds.   No murmur heard. Respiratory: Effort normal and breath sounds normal. No respiratory distress. She has no wheezes.  GI: Soft. Bowel sounds are normal. There is no tenderness.  Neurological: She is alert and oriented to person, place, and time.  Skin: Skin is warm and dry. She is not diaphoretic.  Psychiatric: She has a normal mood and affect. Her behavior is normal. Judgment and thought content normal.   Dilation: Closed Effacement (%): Thick Cervical Position: Posterior Exam by:: Judeth Horn NP  Fetal Tracing:  Baseline: 145 Variability: moderate Accelerations: 10x10 Decelerations: none  Toco: none MAU Course  Procedures Results for orders placed or performed during the hospital encounter of 06/11/16 (from the  past 24 hour(s))  Urinalysis, Routine w reflex microscopic     Status: None   Collection Time: 06/11/16  4:18 PM  Result Value Ref Range   Color, Urine YELLOW YELLOW   APPearance CLEAR CLEAR   Specific Gravity, Urine 1.018 1.005 - 1.030   pH 7.0 5.0 - 8.0   Glucose, UA NEGATIVE NEGATIVE mg/dL   Hgb urine dipstick NEGATIVE NEGATIVE   Bilirubin Urine NEGATIVE NEGATIVE   Ketones, ur NEGATIVE NEGATIVE mg/dL   Protein, ur NEGATIVE NEGATIVE mg/dL   Nitrite NEGATIVE NEGATIVE    Leukocytes, UA NEGATIVE NEGATIVE  Wet prep, genital     Status: Abnormal   Collection Time: 06/11/16  5:44 PM  Result Value Ref Range   Yeast Wet Prep HPF POC PRESENT (A) NONE SEEN   Trich, Wet Prep NONE SEEN NONE SEEN   Clue Cells Wet Prep HPF POC NONE SEEN NONE SEEN   WBC, Wet Prep HPF POC FEW (A) NONE SEEN   Sperm NONE SEEN     MDM Category 1 tracing No contractions; cervix closed GC/CT & wet prep VSS, NAD Assessment and Plan  A: 1. Abdominal pain during pregnancy in second trimester   2. No prenatal care in current pregnancy in second trimester   3. Vaginal yeast infection   4. Pregnancy related nausea, antepartum   5. Encounter for antenatal screening for malformation using ultrasound   6. [redacted] weeks gestation of pregnancy    P: Discharge home Rx phenergan & terazol Discussed reasons to return to MAU Outpatient anatomy ultrasound ordered Msg to [facility] for appointment  GC/CT pending   Judeth Horn 06/11/2016, 5:09 PM

## 2016-06-11 NOTE — Progress Notes (Addendum)
g6p4 @ 27.[redacted] wksga. Presents to triage for abdominal pain "cramping" that started 3 days ago. Took tylenol but not helping. Pt has had NPC with current pregnancy. Denies LOF or bleeding. +FM EFM applied.   Discharge orders received.   1905: Discharge instructions given with pt understanding. Pt left unit via ambulatory with family.

## 2016-06-11 NOTE — MAU Note (Signed)
Pt reports having abd cramping x 3 days. C/O vomiting (chronic/ off and on all pregnancy)taking promethazine without much releif. Stated she had some blood in her emesis.

## 2016-06-12 LAB — GC/CHLAMYDIA PROBE AMP (~~LOC~~) NOT AT ARMC
Chlamydia: NEGATIVE
Neisseria Gonorrhea: NEGATIVE

## 2016-06-13 LAB — CULTURE, OB URINE

## 2016-06-14 ENCOUNTER — Telehealth: Payer: Self-pay | Admitting: Advanced Practice Midwife

## 2016-06-14 DIAGNOSIS — O2342 Unspecified infection of urinary tract in pregnancy, second trimester: Secondary | ICD-10-CM

## 2016-06-14 MED ORDER — SULFAMETHOXAZOLE-TRIMETHOPRIM 800-160 MG PO TABS: 1.0000 | ORAL_TABLET | Freq: Two times a day (BID) | ORAL | 0 refills | Status: DC

## 2016-06-14 NOTE — Telephone Encounter (Signed)
Dx UTI, Rx Bactrim.

## 2016-06-18 ENCOUNTER — Telehealth: Payer: Self-pay

## 2016-06-18 NOTE — Telephone Encounter (Signed)
Left message for patient to call office to R/S new ob appointment.

## 2016-06-26 ENCOUNTER — Ambulatory Visit (HOSPITAL_COMMUNITY)
Admission: RE | Admit: 2016-06-26 | Discharge: 2016-06-26 | Disposition: A | Discharge status: Home / Self Care | Payer: Medicaid Other | Source: Ambulatory Visit | Attending: Student | Admitting: Student

## 2016-06-26 DIAGNOSIS — O0933 Supervision of pregnancy with insufficient antenatal care, third trimester: Secondary | ICD-10-CM | POA: Insufficient documentation

## 2016-06-26 DIAGNOSIS — Z3A29 29 weeks gestation of pregnancy: Secondary | ICD-10-CM | POA: Insufficient documentation

## 2016-06-26 DIAGNOSIS — O0932 Supervision of pregnancy with insufficient antenatal care, second trimester: Secondary | ICD-10-CM

## 2016-06-26 DIAGNOSIS — Z363 Encounter for antenatal screening for malformations: Secondary | ICD-10-CM | POA: Diagnosis not present

## 2016-06-26 DIAGNOSIS — Z3A27 27 weeks gestation of pregnancy: Secondary | ICD-10-CM

## 2016-07-01 ENCOUNTER — Encounter: Payer: Medicaid Other | Admitting: Obstetrics

## 2016-07-06 ENCOUNTER — Encounter: Payer: Medicaid Other | Admitting: Obstetrics and Gynecology

## 2016-07-06 ENCOUNTER — Other Ambulatory Visit: Payer: Medicaid Other

## 2016-08-04 ENCOUNTER — Inpatient Hospital Stay (HOSPITAL_COMMUNITY)
Admission: AD | Admit: 2016-08-04 | Discharge: 2016-08-04 | Disposition: A | Discharge status: Home / Self Care | Payer: Medicaid Other | Source: Ambulatory Visit | Attending: Family Medicine | Admitting: Family Medicine

## 2016-08-04 ENCOUNTER — Encounter (HOSPITAL_COMMUNITY): Payer: Self-pay

## 2016-08-04 DIAGNOSIS — O99333 Smoking (tobacco) complicating pregnancy, third trimester: Secondary | ICD-10-CM | POA: Diagnosis not present

## 2016-08-04 DIAGNOSIS — O26893 Other specified pregnancy related conditions, third trimester: Secondary | ICD-10-CM | POA: Insufficient documentation

## 2016-08-04 DIAGNOSIS — O163 Unspecified maternal hypertension, third trimester: Secondary | ICD-10-CM | POA: Diagnosis not present

## 2016-08-04 DIAGNOSIS — R102 Pelvic and perineal pain: Secondary | ICD-10-CM | POA: Diagnosis present

## 2016-08-04 DIAGNOSIS — F909 Attention-deficit hyperactivity disorder, unspecified type: Secondary | ICD-10-CM | POA: Diagnosis not present

## 2016-08-04 DIAGNOSIS — F17290 Nicotine dependence, other tobacco product, uncomplicated: Secondary | ICD-10-CM | POA: Insufficient documentation

## 2016-08-04 DIAGNOSIS — Z3A35 35 weeks gestation of pregnancy: Secondary | ICD-10-CM | POA: Diagnosis present

## 2016-08-04 DIAGNOSIS — O99343 Other mental disorders complicating pregnancy, third trimester: Secondary | ICD-10-CM | POA: Insufficient documentation

## 2016-08-04 DIAGNOSIS — O0933 Supervision of pregnancy with insufficient antenatal care, third trimester: Secondary | ICD-10-CM | POA: Insufficient documentation

## 2016-08-04 DIAGNOSIS — O479 False labor, unspecified: Secondary | ICD-10-CM

## 2016-08-04 DIAGNOSIS — O4703 False labor before 37 completed weeks of gestation, third trimester: Secondary | ICD-10-CM | POA: Insufficient documentation

## 2016-08-04 LAB — CBC
HCT: 28.5 % — ABNORMAL LOW (ref 36.0–46.0)
HEMOGLOBIN: 8.7 g/dL — AB (ref 12.0–15.0)
MCH: 23.3 pg — AB (ref 26.0–34.0)
MCHC: 30.5 g/dL (ref 30.0–36.0)
MCV: 76.2 fL — AB (ref 78.0–100.0)
Platelets: 245 10*3/uL (ref 150–400)
RBC: 3.74 MIL/uL — AB (ref 3.87–5.11)
RDW: 15.7 % — ABNORMAL HIGH (ref 11.5–15.5)
WBC: 11.3 10*3/uL — ABNORMAL HIGH (ref 4.0–10.5)

## 2016-08-04 LAB — OB RESULTS CONSOLE GBS: GBS: POSITIVE

## 2016-08-04 LAB — DIFFERENTIAL
Basophils Absolute: 0 10*3/uL (ref 0.0–0.1)
Basophils Relative: 0 %
EOS ABS: 0.1 10*3/uL (ref 0.0–0.7)
EOS PCT: 1 %
LYMPHS ABS: 2.9 10*3/uL (ref 0.7–4.0)
LYMPHS PCT: 26 %
MONO ABS: 1 10*3/uL (ref 0.1–1.0)
Monocytes Relative: 9 %
Neutro Abs: 7.3 10*3/uL (ref 1.7–7.7)
Neutrophils Relative %: 64 %

## 2016-08-04 NOTE — MAU Provider Note (Signed)
History     CSN: [id number]  Arrival date and time: 08/04/16 2109   First Provider Initiated Contact with Patient 08/04/16 2157      Chief Complaint  Patient presents with  . Contractions  . Pelvic Pain   Monique Gamble is a 26 y.o. [id number] at 3634w1d who presents today with contractions. She states that she has had contractions off and on yesterday and today. She denies any VB or LOF. She confirms normal fetal movement. She has not had any prenatal care. There is a note from May that [facility] tried to call her to reschedule her missed appointment. She states that she was not able to call them back about that.    Pelvic Pain  The patient's primary symptoms include pelvic pain. This is a new problem. The current episode started yesterday. The problem occurs intermittently. The problem has been unchanged. The pain is mild. The problem affects both sides. She is pregnant. Pertinent negatives include no chills, dysuria, fever, frequency, nausea, urgency or vomiting. Nothing aggravates the symptoms. She has tried nothing for the symptoms.    Past Medical History:  Diagnosis Date  . ADHD (attention deficit hyperactivity disorder)   . Chlamydia   . Headache   . History of anemia   . Hypertension     Past Surgical History:  Procedure Laterality Date  . INDUCED ABORTION      Family History  Problem Relation Age of Onset  . Hypertension Mother   . Diabetes Sister   . Anesthesia problems Neg Hx     Social History  Substance Use Topics  . Smoking status: Current Every Day Smoker    Types: Cigars  . Smokeless tobacco: Never Used  . Alcohol use No    Allergies: No Known Allergies  Prescriptions Prior to Admission  Medication Sig Dispense Refill Last Dose  . acetaminophen (TYLENOL) 500 MG tablet Take 1,000 mg by mouth every 6 (six) hours as needed for mild pain or headache.   06/08/2016  . promethazine (PHENERGAN) 25 MG tablet Take 1 tablet (25 mg total) by mouth every 6  (six) hours as needed for nausea or vomiting. 30 tablet 0   . ranitidine (ZANTAC) 150 MG tablet Take 1 tablet (150 mg total) by mouth 2 (two) times daily. (Patient not taking: Reported on 06/11/2016) 60 tablet 0 Not Taking at Unknown time  . sulfamethoxazole-trimethoprim (BACTRIM DS,SEPTRA DS) 800-160 MG tablet Take 1 tablet by mouth 2 (two) times daily. 14 tablet 0   . terconazole (TERAZOL 3) 0.8 % vaginal cream Place 1 applicator vaginally at bedtime. 20 g 0     Review of Systems  Constitutional: Negative for chills and fever.  Gastrointestinal: Negative for nausea and vomiting.  Genitourinary: Positive for pelvic pain. Negative for dysuria, frequency and urgency.   Physical Exam   Blood pressure 133/83, pulse 98, temperature 98.7 F (37.1 C), temperature source Oral, resp. rate 16, height 5\' 6"  (1.676 m), weight 181 lb (82.1 kg), last menstrual period 12/02/2015, SpO2 97 %, not currently breastfeeding.  Physical Exam  Nursing note and vitals reviewed. Constitutional: She is oriented to person, place, and time. She appears well-developed and well-nourished. No distress.  HENT:  Head: Normocephalic.  Cardiovascular: Normal rate.   Respiratory: Effort normal.  GI: Soft. There is no tenderness. There is no rebound.  Genitourinary:  Genitourinary Comments: Cervix: 1/thick/-3/vtx  Neurological: She is alert and oriented to person, place, and time.  Skin: Skin is warm and dry.  Psychiatric:  She has a normal mood and affect.  FHT: 140, moderate with 15x15 accels, no decels Toco: irregular UCs    Results for orders placed or performed during the hospital encounter of 08/04/16 (from the past 24 hour(s))  CBC     Status: Abnormal   Collection Time: 08/04/16 10:11 PM  Result Value Ref Range   WBC 11.3 (H) 4.0 - 10.5 K/uL   RBC 3.74 (L) 3.87 - 5.11 MIL/uL   Hemoglobin 8.7 (L) 12.0 - 15.0 g/dL   HCT 16.1 (L) 09.6 - 04.5 %   MCV 76.2 (L) 78.0 - 100.0 fL   MCH 23.3 (L) 26.0 - 34.0 pg    MCHC 30.5 30.0 - 36.0 g/dL   RDW 40.9 (H) 81.1 - 91.4 %   Platelets 245 150 - 400 K/uL  Differential     Status: None   Collection Time: 08/04/16 10:11 PM  Result Value Ref Range   Neutrophils Relative % 64 %   Neutro Abs 7.3 1.7 - 7.7 K/uL   Lymphocytes Relative 26 %   Lymphs Abs 2.9 0.7 - 4.0 K/uL   Monocytes Relative 9 %   Monocytes Absolute 1.0 0.1 - 1.0 K/uL   Eosinophils Relative 1 %   Eosinophils Absolute 0.1 0.0 - 0.7 K/uL   Basophils Relative 0 %   Basophils Absolute 0.0 0.0 - 0.1 K/uL    MAU Course  Procedures  MDM Routine OB labs done today: GBS, UC, UDS, Prenatal panel. Will send a message to [facility] to reschedule her appointment.  2300: no cervical change over one hour   Assessment and Plan   1. False labor   2. No prenatal care in current pregnancy in third trimester   3. [redacted] weeks gestation of pregnancy    DC home Comfort measures reviewed  3rd Trimester precautions  PTL precautions  Fetal kick counts RX: none  Return to MAU as needed FU with OB as planned  Follow-up Information    [facility] [place] Follow up.   Specialty:  Obstetrics and Gynecology Why:  They will call you with an appointment  Contact information: [address], [address] [place] [address] [phone]           Thressa Sheller 08/04/2016, 10:04 PM

## 2016-08-04 NOTE — MAU Note (Signed)
Pt reports contractions that started earlier today that are every 10 mins. Reports good fetal movement. Pt denies vaginal bleeding or LOF. Pt states cervix has not been checked.

## 2016-08-04 NOTE — Discharge Instructions (Signed)
Third Trimester of Pregnancy The third trimester is from week 28 through week 40 (months 7 through 9). The third trimester is a time when the unborn baby (fetus) is growing rapidly. At the end of the ninth month, the fetus is about 20 inches in length and weighs 6-10 pounds. Body changes during your third trimester Your body will continue to go through many changes during pregnancy. The changes vary from woman to woman. During the third trimester:  Your weight will continue to increase. You can expect to gain 25-35 pounds (11-16 kg) by the end of the pregnancy.  You may begin to get stretch marks on your hips, abdomen, and breasts.  You may urinate more often because the fetus is moving lower into your pelvis and pressing on your bladder.  You may develop or continue to have heartburn. This is caused by increased hormones that slow down muscles in the digestive tract.  You may develop or continue to have constipation because increased hormones slow digestion and cause the muscles that push waste through your intestines to relax.  You may develop hemorrhoids. These are swollen veins (varicose veins) in the rectum that can itch or be painful.  You may develop swollen, bulging veins (varicose veins) in your legs.  You may have increased body aches in the pelvis, back, or thighs. This is due to weight gain and increased hormones that are relaxing your joints.  You may have changes in your hair. These can include thickening of your hair, rapid growth, and changes in texture. Some women also have hair loss during or after pregnancy, or hair that feels dry or thin. Your hair will most likely return to normal after your baby is born.  Your breasts will continue to grow and they will continue to become tender. A yellow fluid (colostrum) may leak from your breasts. This is the first milk you are producing for your baby.  Your belly button may stick out.  You may notice more swelling in your hands,  face, or ankles.  You may have increased tingling or numbness in your hands, arms, and legs. The skin on your belly may also feel numb.  You may feel short of breath because of your expanding uterus.  You may have more problems sleeping. This can be caused by the size of your belly, increased need to urinate, and an increase in your body's metabolism.  You may notice the fetus "dropping," or moving lower in your abdomen (lightening).  You may have increased vaginal discharge.  You may notice your joints feel loose and you may have pain around your pelvic bone.  What to expect at prenatal visits You will have prenatal exams every 2 weeks until week 36. Then you will have weekly prenatal exams. During a routine prenatal visit:  You will be weighed to make sure you and the baby are growing normally.  Your blood pressure will be taken.  Your abdomen will be measured to track your baby's growth.  The fetal heartbeat will be listened to.  Any test results from the previous visit will be discussed.  You may have a cervical check near your due date to see if your cervix has softened or thinned (effaced).  You will be tested for Group B streptococcus. This happens between 35 and 37 weeks.  Your health care provider may ask you:  What your birth plan is.  How you are feeling.  If you are feeling the baby move.  If you have had   any abnormal symptoms, such as leaking fluid, bleeding, severe headaches, or abdominal cramping.  If you are using any tobacco products, including cigarettes, chewing tobacco, and electronic cigarettes.  If you have any questions.  Other tests or screenings that may be performed during your third trimester include:  Blood tests that check for low iron levels (anemia).  Fetal testing to check the health, activity level, and growth of the fetus. Testing is done if you have certain medical conditions or if there are problems during the  pregnancy.  Nonstress test (NST). This test checks the health of your baby to make sure there are no signs of problems, such as the baby not getting enough oxygen. During this test, a belt is placed around your belly. The baby is made to move, and its heart rate is monitored during movement.  What is false labor? False labor is a condition in which you feel small, irregular tightenings of the muscles in the womb (contractions) that usually go away with rest, changing position, or drinking water. These are called Braxton Hicks contractions. Contractions may last for hours, days, or even weeks before true labor sets in. If contractions come at regular intervals, become more frequent, increase in intensity, or become painful, you should see your health care provider. What are the signs of labor?  Abdominal cramps.  Regular contractions that start at 10 minutes apart and become stronger and more frequent with time.  Contractions that start on the top of the uterus and spread down to the lower abdomen and back.  Increased pelvic pressure and dull back pain.  A watery or bloody mucus discharge that comes from the vagina.  Leaking of amniotic fluid. This is also known as your "water breaking." It could be a slow trickle or a gush. Let your health care provider know if it has a color or strange odor. If you have any of these signs, call your health care provider right away, even if it is before your due date. Follow these instructions at home: Medicines  Follow your health care provider's instructions regarding medicine use. Specific medicines may be either safe or unsafe to take during pregnancy.  Take a prenatal vitamin that contains at least 600 micrograms (mcg) of folic acid.  If you develop constipation, try taking a stool softener if your health care provider approves. Eating and drinking  Eat a balanced diet that includes fresh fruits and vegetables, whole grains, good sources of protein  such as meat, eggs, or tofu, and low-fat dairy. Your health care provider will help you determine the amount of weight gain that is right for you.  Avoid raw meat and uncooked cheese. These carry germs that can cause birth defects in the baby.  If you have low calcium intake from food, talk to your health care provider about whether you should take a daily calcium supplement.  Eat four or five small meals rather than three large meals a day.  Limit foods that are high in fat and processed sugars, such as fried and sweet foods.  To prevent constipation: ? Drink enough fluid to keep your urine clear or pale yellow. ? Eat foods that are high in fiber, such as fresh fruits and vegetables, whole grains, and beans. Activity  Exercise only as directed by your health care provider. Most women can continue their usual exercise routine during pregnancy. Try to exercise for 30 minutes at least 5 days a week. Stop exercising if you experience uterine contractions.  Avoid heavy   lifting.  Do not exercise in extreme heat or humidity, or at high altitudes.  Wear low-heel, comfortable shoes.  Practice good posture.  You may continue to have sex unless your health care provider tells you otherwise. Relieving pain and discomfort  Take frequent breaks and rest with your legs elevated if you have leg cramps or low back pain.  Take warm sitz baths to soothe any pain or discomfort caused by hemorrhoids. Use hemorrhoid cream if your health care provider approves.  Wear a good support bra to prevent discomfort from breast tenderness.  If you develop varicose veins: ? Wear support pantyhose or compression stockings as told by your healthcare provider. ? Elevate your feet for 15 minutes, 3-4 times a day. Prenatal care  Write down your questions. Take them to your prenatal visits.  Keep all your prenatal visits as told by your health care provider. This is important. Safety  Wear your seat belt at  all times when driving.  Make a list of emergency phone numbers, including numbers for family, friends, the hospital, and police and fire departments. General instructions  Avoid cat litter boxes and soil used by cats. These carry germs that can cause birth defects in the baby. If you have a cat, ask someone to clean the litter box for you.  Do not travel far distances unless it is absolutely necessary and only with the approval of your health care provider.  Do not use hot tubs, steam rooms, or saunas.  Do not drink alcohol.  Do not use any products that contain nicotine or tobacco, such as cigarettes and e-cigarettes. If you need help quitting, ask your health care provider.  Do not use any medicinal herbs or unprescribed drugs. These chemicals affect the formation and growth of the baby.  Do not douche or use tampons or scented sanitary pads.  Do not cross your legs for long periods of time.  To prepare for the arrival of your baby: ? Take prenatal classes to understand, practice, and ask questions about labor and delivery. ? Make a trial run to the hospital. ? Visit the hospital and tour the maternity area. ? Arrange for maternity or paternity leave through employers. ? Arrange for family and friends to take care of pets while you are in the hospital. ? Purchase a rear-facing car seat and make sure you know how to install it in your car. ? Pack your hospital bag. ? Prepare the baby's nursery. Make sure to remove all pillows and stuffed animals from the baby's crib to prevent suffocation.  Visit your dentist if you have not gone during your pregnancy. Use a soft toothbrush to brush your teeth and be gentle when you floss. Contact a health care provider if:  You are unsure if you are in labor or if your water has broken.  You become dizzy.  You have mild pelvic cramps, pelvic pressure, or nagging pain in your abdominal area.  You have lower back pain.  You have persistent  nausea, vomiting, or diarrhea.  You have an unusual or bad smelling vaginal discharge.  You have pain when you urinate. Get help right away if:  Your water breaks before 37 weeks.  You have regular contractions less than 5 minutes apart before 37 weeks.  You have a fever.  You are leaking fluid from your vagina.  You have spotting or bleeding from your vagina.  You have severe abdominal pain or cramping.  You have rapid weight loss or weight gain.    You have shortness of breath with chest pain.  You notice sudden or extreme swelling of your face, hands, ankles, feet, or legs.  Your baby makes fewer than 10 movements in 2 hours.  You have severe headaches that do not go away when you take medicine.  You have vision changes. Summary  The third trimester is from week 28 through week 40, months 7 through 9. The third trimester is a time when the unborn baby (fetus) is growing rapidly.  During the third trimester, your discomfort may increase as you and your baby continue to gain weight. You may have abdominal, leg, and back pain, sleeping problems, and an increased need to urinate.  During the third trimester your breasts will keep growing and they will continue to become tender. A yellow fluid (colostrum) may leak from your breasts. This is the first milk you are producing for your baby.  False labor is a condition in which you feel small, irregular tightenings of the muscles in the womb (contractions) that eventually go away. These are called Braxton Hicks contractions. Contractions may last for hours, days, or even weeks before true labor sets in.  Signs of labor can include: abdominal cramps; regular contractions that start at 10 minutes apart and become stronger and more frequent with time; watery or bloody mucus discharge that comes from the vagina; increased pelvic pressure and dull back pain; and leaking of amniotic fluid. This information is not intended to replace advice  given to you by your health care provider. Make sure you discuss any questions you have with your health care provider. Document Released: 01/27/2001 Document Revised: 07/11/2015 Document Reviewed: 04/05/2012 Elsevier Interactive Patient Education  2017 [employer]  

## 2016-08-04 NOTE — Progress Notes (Addendum)
G6P4 @ 35.[redacted] wksga. Presents to triage for ctx since 1100 today. Pain 8-9/10 in lower abldomen. Denies LOF or bleeding. +FM. EFM applied. No PNC.   2200: provider at bs assessing pt. GBS collected and sent to lab. Ordered for Prenatal labs.   SVE 1. Cervical recheck in an hr  2107: crackers and drink provided with permission from provider  2010: lab notified for stat labs

## 2016-08-05 LAB — URINALYSIS, ROUTINE W REFLEX MICROSCOPIC
BILIRUBIN URINE: NEGATIVE
GLUCOSE, UA: NEGATIVE mg/dL
HGB URINE DIPSTICK: NEGATIVE
KETONES UR: 5 mg/dL — AB
LEUKOCYTES UA: NEGATIVE
NITRITE: NEGATIVE
PROTEIN: 30 mg/dL — AB
Specific Gravity, Urine: 1.028 (ref 1.005–1.030)
pH: 6 (ref 5.0–8.0)

## 2016-08-05 LAB — RAPID URINE DRUG SCREEN, HOSP PERFORMED
Amphetamines: NOT DETECTED
Barbiturates: NOT DETECTED
Benzodiazepines: NOT DETECTED
Cocaine: NOT DETECTED
Opiates: NOT DETECTED
Tetrahydrocannabinol: POSITIVE — AB

## 2016-08-05 LAB — HIV ANTIBODY (ROUTINE TESTING W REFLEX): HIV Screen 4th Generation wRfx: NONREACTIVE

## 2016-08-05 LAB — RPR: RPR: NONREACTIVE

## 2016-08-05 LAB — HEPATITIS B SURFACE ANTIGEN: HEP B S AG: NEGATIVE

## 2016-08-06 LAB — RUBELLA SCREEN: Rubella: <0.9 index — ABNORMAL LOW (ref >0.99)

## 2016-08-07 LAB — CULTURE, BETA STREP (GROUP B ONLY)

## 2016-08-07 LAB — CULTURE, OB URINE: Culture: 70000 — AB

## 2016-08-08 ENCOUNTER — Telehealth: Payer: Self-pay | Admitting: Obstetrics and Gynecology

## 2016-08-08 ENCOUNTER — Encounter: Payer: Self-pay | Admitting: Obstetrics and Gynecology

## 2016-08-08 MED ORDER — CEPHALEXIN 500 MG PO CAPS: 500.0000 mg | ORAL_CAPSULE | Freq: Four times a day (QID) | ORAL | 0 refills | Status: DC

## 2016-08-08 NOTE — Telephone Encounter (Signed)
+   urine culture Keflex called into the pharmacy. Patient made aware.    Duane Lopeasch, Jasmyne Lodato I, NP 08/08/2016 4:42 PM

## 2016-08-12 ENCOUNTER — Inpatient Hospital Stay (HOSPITAL_COMMUNITY)
Admission: AD | Admit: 2016-08-12 | Discharge: 2016-08-12 | Disposition: A | Discharge status: Home / Self Care | Payer: Medicaid Other | Source: Ambulatory Visit | Attending: Obstetrics and Gynecology | Admitting: Obstetrics and Gynecology

## 2016-08-12 DIAGNOSIS — O479 False labor, unspecified: Secondary | ICD-10-CM

## 2016-08-12 DIAGNOSIS — Z3A37 37 weeks gestation of pregnancy: Secondary | ICD-10-CM | POA: Insufficient documentation

## 2016-08-12 MED ORDER — OXYCODONE-ACETAMINOPHEN 5-325 MG PO TABS
2.0000 | ORAL_TABLET | Freq: Once | ORAL | Status: AC
  Administered 2016-08-12: 2 via ORAL
  Filled 2016-08-12: qty 2

## 2016-08-12 MED ORDER — CEFTRIAXONE SODIUM 1 G IJ SOLR
1.0000 g | Freq: Once | INTRAMUSCULAR | Status: AC
Start: 2016-08-12 — End: 2016-08-12
  Administered 2016-08-12: 1 g via INTRAMUSCULAR
  Filled 2016-08-12: qty 10

## 2016-08-12 NOTE — MAU Note (Signed)
Pt reports with CTX every 5-6 mins.  No LOF or VB.  Reports good FM.  Rates pain 9/10.  Last check was 6/19 and 1 cm.  No PNC.  Recently prescribed Keflex for UTI-has taken 1 dose today only.

## 2016-08-12 NOTE — MAU Note (Signed)
I have communicated with Sharen CounterLisa Leftwich-Kirby, CNM and reviewed vital signs:  Vitals:   08/12/16 2109  BP: 129/82  Pulse: (!) 114  Resp: 19  Temp: 98.5 F (36.9 C)    Vaginal exam:  Dilation: 3 Effacement (%): 50 Cervical Position: Anterior Station: -2 Presentation: Vertex Exam by:: Latricia HeftAnna Mata Rowen, RN,   Also reviewed contraction pattern and that non-stress test is reactive.  It has been documented that patient is contracting every 5-6 minutes with no cervical change over 1.5 hours not indicating active labor.  Patient denies any other complaints.  Based on this report provider has given order for discharge.  A discharge order and diagnosis entered by a provider.   Labor discharge instructions reviewed with patient.

## 2016-08-12 NOTE — Discharge Instructions (Signed)

## 2016-08-13 ENCOUNTER — Telehealth: Payer: Self-pay | Admitting: General Practice

## 2016-08-13 NOTE — Telephone Encounter (Signed)
Called and notified patient of New OB appointment that is scheduled on 08/18/16 at 12:40pm.

## 2016-08-15 ENCOUNTER — Inpatient Hospital Stay (HOSPITAL_COMMUNITY)
Admission: AD | Admit: 2016-08-15 | Discharge: 2016-08-16 | Disposition: A | Discharge status: Home / Self Care | Payer: Medicaid Other | Source: Ambulatory Visit | Attending: Family Medicine | Admitting: Family Medicine

## 2016-08-15 ENCOUNTER — Encounter (HOSPITAL_COMMUNITY): Payer: Self-pay

## 2016-08-15 DIAGNOSIS — O4703 False labor before 37 completed weeks of gestation, third trimester: Secondary | ICD-10-CM | POA: Diagnosis not present

## 2016-08-15 DIAGNOSIS — Z9889 Other specified postprocedural states: Secondary | ICD-10-CM | POA: Diagnosis not present

## 2016-08-15 DIAGNOSIS — F909 Attention-deficit hyperactivity disorder, unspecified type: Secondary | ICD-10-CM | POA: Diagnosis not present

## 2016-08-15 DIAGNOSIS — O99013 Anemia complicating pregnancy, third trimester: Secondary | ICD-10-CM | POA: Diagnosis not present

## 2016-08-15 DIAGNOSIS — Z8619 Personal history of other infectious and parasitic diseases: Secondary | ICD-10-CM | POA: Diagnosis not present

## 2016-08-15 DIAGNOSIS — Z833 Family history of diabetes mellitus: Secondary | ICD-10-CM | POA: Insufficient documentation

## 2016-08-15 DIAGNOSIS — O163 Unspecified maternal hypertension, third trimester: Secondary | ICD-10-CM | POA: Insufficient documentation

## 2016-08-15 DIAGNOSIS — O99333 Smoking (tobacco) complicating pregnancy, third trimester: Secondary | ICD-10-CM | POA: Diagnosis not present

## 2016-08-15 DIAGNOSIS — O479 False labor, unspecified: Secondary | ICD-10-CM | POA: Diagnosis not present

## 2016-08-15 DIAGNOSIS — Z3A36 36 weeks gestation of pregnancy: Secondary | ICD-10-CM | POA: Insufficient documentation

## 2016-08-15 DIAGNOSIS — O99343 Other mental disorders complicating pregnancy, third trimester: Secondary | ICD-10-CM | POA: Insufficient documentation

## 2016-08-15 DIAGNOSIS — R102 Pelvic and perineal pain: Secondary | ICD-10-CM | POA: Diagnosis present

## 2016-08-15 DIAGNOSIS — Z8249 Family history of ischemic heart disease and other diseases of the circulatory system: Secondary | ICD-10-CM | POA: Insufficient documentation

## 2016-08-15 DIAGNOSIS — F17290 Nicotine dependence, other tobacco product, uncomplicated: Secondary | ICD-10-CM | POA: Diagnosis not present

## 2016-08-15 DIAGNOSIS — O10919 Unspecified pre-existing hypertension complicating pregnancy, unspecified trimester: Secondary | ICD-10-CM

## 2016-08-15 DIAGNOSIS — O471 False labor at or after 37 completed weeks of gestation: Secondary | ICD-10-CM | POA: Diagnosis not present

## 2016-08-15 LAB — PROTEIN / CREATININE RATIO, URINE
Creatinine, Urine: 241 mg/dL
Protein Creatinine Ratio: 0.12 mg/mg{Cre} (ref 0.00–0.15)
Total Protein, Urine: 30 mg/dL

## 2016-08-15 LAB — COMPREHENSIVE METABOLIC PANEL
ALBUMIN: 2.7 g/dL — AB (ref 3.5–5.0)
ALK PHOS: 166 U/L — AB (ref 38–126)
ALT: 8 U/L — AB (ref 14–54)
AST: 21 U/L (ref 15–41)
Anion gap: 9 (ref 5–15)
BUN: 5 mg/dL — ABNORMAL LOW (ref 6–20)
CO2: 23 mmol/L (ref 22–32)
Calcium: 9 mg/dL (ref 8.9–10.3)
Chloride: 104 mmol/L (ref 101–111)
Creatinine, Ser: 0.61 mg/dL (ref 0.44–1.00)
GFR calc Af Amer: >60 mL/min (ref >60)
GFR calc non Af Amer: >60 mL/min (ref >60)
GLUCOSE: 84 mg/dL (ref 65–99)
Potassium: 4 mmol/L (ref 3.5–5.1)
SODIUM: 136 mmol/L (ref 135–145)
Total Bilirubin: 1.3 mg/dL — ABNORMAL HIGH (ref 0.3–1.2)
Total Protein: 6.7 g/dL (ref 6.5–8.1)

## 2016-08-15 LAB — CBC
HCT: 29.2 % — ABNORMAL LOW (ref 36.0–46.0)
HEMOGLOBIN: 8.9 g/dL — AB (ref 12.0–15.0)
MCH: 22.8 pg — AB (ref 26.0–34.0)
MCHC: 30.5 g/dL (ref 30.0–36.0)
MCV: 74.9 fL — AB (ref 78.0–100.0)
PLATELETS: 247 10*3/uL (ref 150–400)
RBC: 3.9 MIL/uL (ref 3.87–5.11)
RDW: 15.9 % — ABNORMAL HIGH (ref 11.5–15.5)
WBC: 11.3 10*3/uL — ABNORMAL HIGH (ref 4.0–10.5)

## 2016-08-15 MED ORDER — ZOLPIDEM TARTRATE 5 MG PO TABS
5.0000 mg | ORAL_TABLET | Freq: Once | ORAL | Status: AC
  Administered 2016-08-15: 5 mg via ORAL
  Filled 2016-08-15: qty 1

## 2016-08-15 MED ORDER — ZOLPIDEM TARTRATE 5 MG PO TABS: 5.0000 mg | ORAL_TABLET | Freq: Once | ORAL | 0 refills | Status: DC

## 2016-08-15 NOTE — Progress Notes (Addendum)
Z6X0@G6P4@ 36.[redacted] wksga. Presents to triage for ctx. Denies LOF or bleeding. +FM. EFM applied.   Hx: 4 SVD  No PNC   GBS+   2127: SVE: 4.5/80/-2 vertex  2135: Provider notified. Report status of pt given. Orders received to recheck cervix in an hr and call back.

## 2016-08-16 DIAGNOSIS — O479 False labor, unspecified: Secondary | ICD-10-CM

## 2016-08-16 NOTE — Discharge Instructions (Signed)

## 2016-08-16 NOTE — MAU Provider Note (Signed)
MAU HISTORY AND PHYSICAL  Chief Complaint:  Contractions   Monique Gamble is a 26 y.o.  [id number]  at 5570w6d presenting for Contractions  This evening. Has had occasional headache but none today. No vision change or sob or upper abdominal pain. No lof or bleeding. Has initial prenatal scheduled for 7/3. Why hasn't established? "I'm hard-headed."     Past Medical History:  Diagnosis Date  . ADHD (attention deficit hyperactivity disorder)   . Chlamydia   . Headache   . History of anemia   . Hypertension     Past Surgical History:  Procedure Laterality Date  . INDUCED ABORTION      Family History  Problem Relation Age of Onset  . Hypertension Mother   . Diabetes Sister   . Anesthesia problems Neg Hx     Social History  Substance Use Topics  . Smoking status: Current Every Day Smoker    Types: Cigars  . Smokeless tobacco: Never Used  . Alcohol use No    No Known Allergies  Prescriptions Prior to Admission  Medication Sig Dispense Refill Last Dose  . acetaminophen (TYLENOL) 500 MG tablet Take 1,000 mg by mouth every 6 (six) hours as needed for mild pain or headache.   Past Week at Unknown time  . cephALEXin (KEFLEX) 500 MG capsule Take 1 capsule (500 mg total) by mouth 4 (four) times daily. 28 capsule 0     Review of Systems - Negative except for what is mentioned in HPI.  Physical Exam  Blood pressure 134/84, pulse 99, temperature 98.3 F (36.8 C), temperature source Oral, resp. rate 18, last menstrual period 12/02/2015, not currently breastfeeding. GENERAL: Well-developed, well-nourished female in no acute distress.  LUNGS: Clear to auscultation bilaterally.  HEART: Regular rate and rhythm. ABDOMEN: Soft, nontender, nondistended, gravid.  EXTREMITIES: Nontender, no edema, 2+ distal pulses. GU: 4.5/60/-3 Presentation: cephalic FHT:  150/mod/-a/-d Contractions: irregular   Labs: Results for orders placed or performed during the hospital encounter of  08/15/16 (from the past 24 hour(s))  Comprehensive metabolic panel   Collection Time: 08/15/16 10:48 PM  Result Value Ref Range   Sodium 136 135 - 145 mmol/L   Potassium 4.0 3.5 - 5.1 mmol/L   Chloride 104 101 - 111 mmol/L   CO2 23 22 - 32 mmol/L   Glucose, Bld 84 65 - 99 mg/dL   BUN 5 (L) 6 - 20 mg/dL   Creatinine, Ser 8.410.61 0.44 - 1.00 mg/dL   Calcium 9.0 8.9 - 32.410.3 mg/dL   Total Protein 6.7 6.5 - 8.1 g/dL   Albumin 2.7 (L) 3.5 - 5.0 g/dL   AST 21 15 - 41 U/L   ALT 8 (L) 14 - 54 U/L   Alkaline Phosphatase 166 (H) 38 - 126 U/L   Total Bilirubin 1.3 (H) 0.3 - 1.2 mg/dL   GFR calc non Af Amer >60 >60 mL/min   GFR calc Af Amer >60 >60 mL/min   Anion gap 9 5 - 15  CBC   Collection Time: 08/15/16 10:48 PM  Result Value Ref Range   WBC 11.3 (H) 4.0 - 10.5 K/uL   RBC 3.90 3.87 - 5.11 MIL/uL   Hemoglobin 8.9 (L) 12.0 - 15.0 g/dL   HCT 40.129.2 (L) 02.736.0 - 25.346.0 %   MCV 74.9 (L) 78.0 - 100.0 fL   MCH 22.8 (L) 26.0 - 34.0 pg   MCHC 30.5 30.0 - 36.0 g/dL   RDW 66.415.9 (H) 40.311.5 - 47.415.5 %  Platelets 247 150 - 400 K/uL  Protein / creatinine ratio, urine   Collection Time: 08/15/16 10:50 PM  Result Value Ref Range   Creatinine, Urine 241.00 mg/dL   Total Protein, Urine 30 mg/dL   Protein Creatinine Ratio 0.12 0.00 - 0.15 mg/mg[Cre]    Imaging Studies:  No results found.  Assessment: Monique Gamble is  26 y.o. [id number] at [redacted]w[redacted]d presents with false labor. One of serial bps elevated; appears may have chtn, not currently on meds. Asymptomatic, labs unremarkable. NST reactive. Prenatal labs have already been obtained, significant for anemia and gbs positive  Plan: - initial OB visit 2 days. Ptl, pprom, and abruption return precautions discussed  Silvano Bilis 7/1/201812:10 AM

## 2016-08-17 ENCOUNTER — Inpatient Hospital Stay (HOSPITAL_COMMUNITY)
Admission: AD | Admit: 2016-08-17 | Discharge: 2016-08-17 | Disposition: A | Discharge status: Home / Self Care | Payer: Medicaid Other | Source: Ambulatory Visit | Attending: Obstetrics & Gynecology | Admitting: Obstetrics & Gynecology

## 2016-08-17 ENCOUNTER — Encounter (HOSPITAL_COMMUNITY): Payer: Self-pay | Admitting: Certified Nurse Midwife

## 2016-08-17 DIAGNOSIS — O471 False labor at or after 37 completed weeks of gestation: Secondary | ICD-10-CM | POA: Diagnosis not present

## 2016-08-17 DIAGNOSIS — O99333 Smoking (tobacco) complicating pregnancy, third trimester: Secondary | ICD-10-CM | POA: Diagnosis not present

## 2016-08-17 DIAGNOSIS — Z3A37 37 weeks gestation of pregnancy: Secondary | ICD-10-CM | POA: Insufficient documentation

## 2016-08-17 DIAGNOSIS — F17290 Nicotine dependence, other tobacco product, uncomplicated: Secondary | ICD-10-CM | POA: Diagnosis not present

## 2016-08-17 MED ORDER — GI COCKTAIL ~~LOC~~
30.0000 mL | Freq: Once | ORAL | Status: AC
  Administered 2016-08-17: 30 mL via ORAL
  Filled 2016-08-17: qty 30

## 2016-08-17 NOTE — MAU Provider Note (Signed)
Patient Monique Gamble is a 26 y.o. [id number] 8244w0d here for rule out labor. Patient is now 6 cm and contracting 1-2 in 10 minutes;  She is talking through her contractions and was observed sleeping for long periods of time in the MAU.    History     CSN: [id number]  Arrival date and time: 08/17/16 0145   None     Chief Complaint  Patient presents with  . Labor Eval   HPI  OB History    Gravida Para Term Preterm AB Living   6 4 4  0 1 4   SAB TAB Ectopic Multiple Live Births   0 1 0 0 4      Past Medical History:  Diagnosis Date  . ADHD (attention deficit hyperactivity disorder)   . Chlamydia   . Headache   . History of anemia   . Hypertension     Past Surgical History:  Procedure Laterality Date  . INDUCED ABORTION      Family History  Problem Relation Age of Onset  . Hypertension Mother   . Diabetes Sister   . Anesthesia problems Neg Hx     Social History  Substance Use Topics  . Smoking status: Current Every Day Smoker    Types: Cigars  . Smokeless tobacco: Never Used  . Alcohol use No    Allergies: No Known Allergies  Prescriptions Prior to Admission  Medication Sig Dispense Refill Last Dose  . acetaminophen (TYLENOL) 500 MG tablet Take 1,000 mg by mouth every 6 (six) hours as needed for mild pain or headache.   Past Week at Unknown time  . zolpidem (AMBIEN) 5 MG tablet Take 1 tablet (5 mg total) by mouth once. 10 tablet 0     Review of Systems  Constitutional: Negative.   Respiratory: Negative.   Cardiovascular: Negative.   Gastrointestinal: Negative.   Genitourinary: Negative.   Musculoskeletal: Negative.   Neurological: Negative.    Physical Exam   Blood pressure 133/90, pulse 98, temperature 98.1 F (36.7 C), temperature source Oral, resp. rate 18, last menstrual period 12/02/2015, not currently breastfeeding.  Physical Exam  Constitutional: She is oriented to person, place, and time. She appears well-developed and well-nourished.   HENT:  Head: Normocephalic.  Eyes: Pupils are equal, round, and reactive to light.  Respiratory: Effort normal.  GI: Soft.  Genitourinary: Vagina normal.  Musculoskeletal: Normal range of motion.  Neurological: She is alert and oriented to person, place, and time. She has normal reflexes.  Skin: Skin is warm and dry.  Psychiatric: She has a normal mood and affect.  Cervix is unchanged after two hours of monitoring; blood pressure is 133/90 immediately after exam and while patient is agitated at being sent home.  Without prenatal care, it is impossible to know if patient is gestational HTN or chronic HTN.   MAU Course  Procedures  MDM -NST: 130 bpm, moderate variability, no decels, present acels   Assessment and Plan   1. False labor after 37 completed weeks of gestation    2. Reviewed warning signs and when to return to MAU. [facility]   Charlesetta GaribaldiKathryn Lorraine Jacqulyn Barresi CNM 08/17/2016, 4:37 AM

## 2016-08-17 NOTE — MAU Note (Signed)
Patient presents to MAU with c/o contractions. Lost mucus plug on toilet 1 hour ago. States contractions are occurring every 5 minutes. Denies LOF or VB. +FM. Was here last night where she was 4.5/70/-2

## 2016-08-17 NOTE — Discharge Instructions (Signed)

## 2016-08-18 ENCOUNTER — Ambulatory Visit (INDEPENDENT_AMBULATORY_CARE_PROVIDER_SITE_OTHER): Payer: Medicaid Other | Admitting: Advanced Practice Midwife

## 2016-08-18 ENCOUNTER — Telehealth: Payer: Self-pay | Admitting: Lab

## 2016-08-18 ENCOUNTER — Encounter: Payer: Self-pay | Admitting: Advanced Practice Midwife

## 2016-08-18 VITALS — BP 129/77 | HR 108 | Ht 65.0 in | Wt 183.3 lb

## 2016-08-18 DIAGNOSIS — O0990 Supervision of high risk pregnancy, unspecified, unspecified trimester: Secondary | ICD-10-CM | POA: Insufficient documentation

## 2016-08-18 DIAGNOSIS — O0993 Supervision of high risk pregnancy, unspecified, third trimester: Secondary | ICD-10-CM | POA: Diagnosis not present

## 2016-08-18 DIAGNOSIS — Z3480 Encounter for supervision of other normal pregnancy, unspecified trimester: Secondary | ICD-10-CM

## 2016-08-18 NOTE — Addendum Note (Signed)
Addended by: Clearnce SorrelPICKARD, JILL S on: 08/18/2016 05:04 PM   Modules accepted: Orders

## 2016-08-18 NOTE — Patient Instructions (Signed)
Third Trimester of Pregnancy The third trimester is from week 28 through week 40 (months 7 through 9). The third trimester is a time when the unborn baby (fetus) is growing rapidly. At the end of the ninth month, the fetus is about 20 inches in length and weighs 6-10 pounds. Body changes during your third trimester Your body will continue to go through many changes during pregnancy. The changes vary from woman to woman. During the third trimester:  Your weight will continue to increase. You can expect to gain 25-35 pounds (11-16 kg) by the end of the pregnancy.  You may begin to get stretch marks on your hips, abdomen, and breasts.  You may urinate more often because the fetus is moving lower into your pelvis and pressing on your bladder.  You may develop or continue to have heartburn. This is caused by increased hormones that slow down muscles in the digestive tract.  You may develop or continue to have constipation because increased hormones slow digestion and cause the muscles that push waste through your intestines to relax.  You may develop hemorrhoids. These are swollen veins (varicose veins) in the rectum that can itch or be painful.  You may develop swollen, bulging veins (varicose veins) in your legs.  You may have increased body aches in the pelvis, back, or thighs. This is due to weight gain and increased hormones that are relaxing your joints.  You may have changes in your hair. These can include thickening of your hair, rapid growth, and changes in texture. Some women also have hair loss during or after pregnancy, or hair that feels dry or thin. Your hair will most likely return to normal after your baby is born.  Your breasts will continue to grow and they will continue to become tender. A yellow fluid (colostrum) may leak from your breasts. This is the first milk you are producing for your baby.  Your belly button may stick out.  You may notice more swelling in your hands,  face, or ankles.  You may have increased tingling or numbness in your hands, arms, and legs. The skin on your belly may also feel numb.  You may feel short of breath because of your expanding uterus.  You may have more problems sleeping. This can be caused by the size of your belly, increased need to urinate, and an increase in your body's metabolism.  You may notice the fetus "dropping," or moving lower in your abdomen (lightening).  You may have increased vaginal discharge.  You may notice your joints feel loose and you may have pain around your pelvic bone.  What to expect at prenatal visits You will have prenatal exams every 2 weeks until week 36. Then you will have weekly prenatal exams. During a routine prenatal visit:  You will be weighed to make sure you and the baby are growing normally.  Your blood pressure will be taken.  Your abdomen will be measured to track your baby's growth.  The fetal heartbeat will be listened to.  Any test results from the previous visit will be discussed.  You may have a cervical check near your due date to see if your cervix has softened or thinned (effaced).  You will be tested for Group B streptococcus. This happens between 35 and 37 weeks.  Your health care provider may ask you:  What your birth plan is.  How you are feeling.  If you are feeling the baby move.  If you have had   any abnormal symptoms, such as leaking fluid, bleeding, severe headaches, or abdominal cramping.  If you are using any tobacco products, including cigarettes, chewing tobacco, and electronic cigarettes.  If you have any questions.  Other tests or screenings that may be performed during your third trimester include:  Blood tests that check for low iron levels (anemia).  Fetal testing to check the health, activity level, and growth of the fetus. Testing is done if you have certain medical conditions or if there are problems during the  pregnancy.  Nonstress test (NST). This test checks the health of your baby to make sure there are no signs of problems, such as the baby not getting enough oxygen. During this test, a belt is placed around your belly. The baby is made to move, and its heart rate is monitored during movement.  What is false labor? False labor is a condition in which you feel small, irregular tightenings of the muscles in the womb (contractions) that usually go away with rest, changing position, or drinking water. These are called Braxton Hicks contractions. Contractions may last for hours, days, or even weeks before true labor sets in. If contractions come at regular intervals, become more frequent, increase in intensity, or become painful, you should see your health care provider. What are the signs of labor?  Abdominal cramps.  Regular contractions that start at 10 minutes apart and become stronger and more frequent with time.  Contractions that start on the top of the uterus and spread down to the lower abdomen and back.  Increased pelvic pressure and dull back pain.  A watery or bloody mucus discharge that comes from the vagina.  Leaking of amniotic fluid. This is also known as your "water breaking." It could be a slow trickle or a gush. Let your health care provider know if it has a color or strange odor. If you have any of these signs, call your health care provider right away, even if it is before your due date. Follow these instructions at home: Medicines  Follow your health care provider's instructions regarding medicine use. Specific medicines may be either safe or unsafe to take during pregnancy.  Take a prenatal vitamin that contains at least 600 micrograms (mcg) of folic acid.  If you develop constipation, try taking a stool softener if your health care provider approves. Eating and drinking  Eat a balanced diet that includes fresh fruits and vegetables, whole grains, good sources of protein  such as meat, eggs, or tofu, and low-fat dairy. Your health care provider will help you determine the amount of weight gain that is right for you.  Avoid raw meat and uncooked cheese. These carry germs that can cause birth defects in the baby.  If you have low calcium intake from food, talk to your health care provider about whether you should take a daily calcium supplement.  Eat four or five small meals rather than three large meals a day.  Limit foods that are high in fat and processed sugars, such as fried and sweet foods.  To prevent constipation: ? Drink enough fluid to keep your urine clear or pale yellow. ? Eat foods that are high in fiber, such as fresh fruits and vegetables, whole grains, and beans. Activity  Exercise only as directed by your health care provider. Most women can continue their usual exercise routine during pregnancy. Try to exercise for 30 minutes at least 5 days a week. Stop exercising if you experience uterine contractions.  Avoid heavy   lifting.  Do not exercise in extreme heat or humidity, or at high altitudes.  Wear low-heel, comfortable shoes.  Practice good posture.  You may continue to have sex unless your health care provider tells you otherwise. Relieving pain and discomfort  Take frequent breaks and rest with your legs elevated if you have leg cramps or low back pain.  Take warm sitz baths to soothe any pain or discomfort caused by hemorrhoids. Use hemorrhoid cream if your health care provider approves.  Wear a good support bra to prevent discomfort from breast tenderness.  If you develop varicose veins: ? Wear support pantyhose or compression stockings as told by your healthcare provider. ? Elevate your feet for 15 minutes, 3-4 times a day. Prenatal care  Write down your questions. Take them to your prenatal visits.  Keep all your prenatal visits as told by your health care provider. This is important. Safety  Wear your seat belt at  all times when driving.  Make a list of emergency phone numbers, including numbers for family, friends, the hospital, and police and fire departments. General instructions  Avoid cat litter boxes and soil used by cats. These carry germs that can cause birth defects in the baby. If you have a cat, ask someone to clean the litter box for you.  Do not travel far distances unless it is absolutely necessary and only with the approval of your health care provider.  Do not use hot tubs, steam rooms, or saunas.  Do not drink alcohol.  Do not use any products that contain nicotine or tobacco, such as cigarettes and e-cigarettes. If you need help quitting, ask your health care provider.  Do not use any medicinal herbs or unprescribed drugs. These chemicals affect the formation and growth of the baby.  Do not douche or use tampons or scented sanitary pads.  Do not cross your legs for long periods of time.  To prepare for the arrival of your baby: ? Take prenatal classes to understand, practice, and ask questions about labor and delivery. ? Make a trial run to the hospital. ? Visit the hospital and tour the maternity area. ? Arrange for maternity or paternity leave through employers. ? Arrange for family and friends to take care of pets while you are in the hospital. ? Purchase a rear-facing car seat and make sure you know how to install it in your car. ? Pack your hospital bag. ? Prepare the baby's nursery. Make sure to remove all pillows and stuffed animals from the baby's crib to prevent suffocation.  Visit your dentist if you have not gone during your pregnancy. Use a soft toothbrush to brush your teeth and be gentle when you floss. Contact a health care provider if:  You are unsure if you are in labor or if your water has broken.  You become dizzy.  You have mild pelvic cramps, pelvic pressure, or nagging pain in your abdominal area.  You have lower back pain.  You have persistent  nausea, vomiting, or diarrhea.  You have an unusual or bad smelling vaginal discharge.  You have pain when you urinate. Get help right away if:  Your water breaks before 37 weeks.  You have regular contractions less than 5 minutes apart before 37 weeks.  You have a fever.  You are leaking fluid from your vagina.  You have spotting or bleeding from your vagina.  You have severe abdominal pain or cramping.  You have rapid weight loss or weight gain.    You have shortness of breath with chest pain.  You notice sudden or extreme swelling of your face, hands, ankles, feet, or legs.  Your baby makes fewer than 10 movements in 2 hours.  You have severe headaches that do not go away when you take medicine.  You have vision changes. Summary  The third trimester is from week 28 through week 40, months 7 through 9. The third trimester is a time when the unborn baby (fetus) is growing rapidly.  During the third trimester, your discomfort may increase as you and your baby continue to gain weight. You may have abdominal, leg, and back pain, sleeping problems, and an increased need to urinate.  During the third trimester your breasts will keep growing and they will continue to become tender. A yellow fluid (colostrum) may leak from your breasts. This is the first milk you are producing for your baby.  False labor is a condition in which you feel small, irregular tightenings of the muscles in the womb (contractions) that eventually go away. These are called Braxton Hicks contractions. Contractions may last for hours, days, or even weeks before true labor sets in.  Signs of labor can include: abdominal cramps; regular contractions that start at 10 minutes apart and become stronger and more frequent with time; watery or bloody mucus discharge that comes from the vagina; increased pelvic pressure and dull back pain; and leaking of amniotic fluid. This information is not intended to replace advice  given to you by your health care provider. Make sure you discuss any questions you have with your health care provider. Document Released: 01/27/2001 Document Revised: 07/11/2015 Document Reviewed: 04/05/2012 Elsevier Interactive Patient Education  2017 [employer]  

## 2016-08-18 NOTE — Progress Notes (Signed)
  Subjective:    Monique Gamble is being seen today for her first obstetrical visit.  This is not a planned pregnancy. She is at 2526w1d gestation. Her obstetrical history is significant for pre-eclampsia. Relationship with FOB: significant other, not living together. Patient does not intend to breast feed. Pregnancy history fully reviewed.  Patient reports backache, fatigue and occasional contractions.  Review of Systems:   Review of Systems  Gastrointestinal: Negative for nausea and vomiting.  Genitourinary: Positive for pelvic pain. Negative for vaginal bleeding and vaginal discharge.  Musculoskeletal: Positive for back pain.    Objective:     BP 129/77   Pulse (!) 108   Ht 5\' 5"  (1.651 m)   Wt 183 lb 4.8 oz (83.1 kg)   LMP 12/02/2015 (Approximate)   BMI 30.50 kg/m  Physical Exam  Nursing note and vitals reviewed. Constitutional: She is oriented to person, place, and time. She appears well-developed and well-nourished. No distress.  HENT:  Head: Normocephalic.  Cardiovascular: Normal rate.   GI: Soft. There is no tenderness. There is no rebound.  Genitourinary:  Genitourinary Comments: Cervix:5/thick/-2/vtx  Neurological: She is alert and oriented to person, place, and time.  Skin: Skin is warm and dry.  Psychiatric: She has a normal mood and affect.    Exam  FHT: 140 with doppler Fundal Height 36cm   Assessment:    Pregnancy: [id number] Patient Active Problem List   Diagnosis Date Noted  . Supervision of high risk pregnancy, antepartum 08/18/2016  . Preeclampsia in postpartum period 01/30/2015  . Supervision of other normal pregnancy, antepartum 12/19/2014       Plan:     Initial labs drawn. Prenatal vitamins. Problem list reviewed and updated. AFP3 discussed: too late . Role of ultrasound in pregnancy discussed; fetal survey: results reviewed. Amniocentesis discussed: not indicated. Follow up in 1 weeks. 50% of 30 min visit spent on counseling and  coordination of care.    DC home Comfort measures reviewed  3rd Trimester precautions  Labor precautions  Fetal kick counts RX: Return in 1 week BTL- consent signed (08/18/16) will plan depo bridge/interval tubal.  1 hour GTT done today Will order FU ultrasound    Thressa ShellerHeather Aerika Groll 08/18/2016

## 2016-08-18 NOTE — Telephone Encounter (Signed)
Called patient to give her [facility] appointment for 08/25/2016 at 11:15

## 2016-08-19 LAB — POCT URINALYSIS DIP (DEVICE)
Glucose, UA: NEGATIVE mg/dL
KETONES UR: 15 mg/dL — AB
Nitrite: NEGATIVE
PROTEIN: 100 mg/dL — AB
SPECIFIC GRAVITY, URINE: 1.02 (ref 1.005–1.030)
Urobilinogen, UA: 1 mg/dL (ref 0.0–1.0)
pH: 7 (ref 5.0–8.0)

## 2016-08-19 LAB — GLUCOSE TOLERANCE, 1 HOUR: GLUCOSE, 1HR PP: 124 mg/dL (ref 65–199)

## 2016-08-19 LAB — ANTIBODY SCREEN: Antibody Screen: NEGATIVE

## 2016-08-19 LAB — ABO

## 2016-08-19 LAB — RH TYPE: Rh Factor: POSITIVE

## 2016-08-21 ENCOUNTER — Encounter (HOSPITAL_COMMUNITY): Payer: Self-pay

## 2016-08-21 ENCOUNTER — Inpatient Hospital Stay (HOSPITAL_COMMUNITY)
Admission: AD | Admit: 2016-08-21 | Discharge: 2016-08-24 | DRG: 775 | Disposition: A | Discharge status: Home / Self Care | Payer: Medicaid Other | Source: Ambulatory Visit | Attending: Obstetrics & Gynecology | Admitting: Obstetrics & Gynecology

## 2016-08-21 DIAGNOSIS — O134 Gestational [pregnancy-induced] hypertension without significant proteinuria, complicating childbirth: Secondary | ICD-10-CM | POA: Diagnosis present

## 2016-08-21 DIAGNOSIS — Z3A37 37 weeks gestation of pregnancy: Secondary | ICD-10-CM | POA: Diagnosis not present

## 2016-08-21 DIAGNOSIS — Z87891 Personal history of nicotine dependence: Secondary | ICD-10-CM | POA: Diagnosis not present

## 2016-08-21 DIAGNOSIS — O0930 Supervision of pregnancy with insufficient antenatal care, unspecified trimester: Secondary | ICD-10-CM

## 2016-08-21 DIAGNOSIS — O133 Gestational [pregnancy-induced] hypertension without significant proteinuria, third trimester: Secondary | ICD-10-CM

## 2016-08-21 DIAGNOSIS — O0990 Supervision of high risk pregnancy, unspecified, unspecified trimester: Secondary | ICD-10-CM

## 2016-08-21 DIAGNOSIS — O139 Gestational [pregnancy-induced] hypertension without significant proteinuria, unspecified trimester: Secondary | ICD-10-CM | POA: Diagnosis present

## 2016-08-21 LAB — CBC
HCT: 29.7 % — ABNORMAL LOW (ref 36.0–46.0)
Hemoglobin: 8.9 g/dL — ABNORMAL LOW (ref 12.0–15.0)
MCH: 22.4 pg — ABNORMAL LOW (ref 26.0–34.0)
MCHC: 30 g/dL (ref 30.0–36.0)
MCV: 74.6 fL — AB (ref 78.0–100.0)
PLATELETS: 242 10*3/uL (ref 150–400)
RBC: 3.98 MIL/uL (ref 3.87–5.11)
RDW: 16 % — ABNORMAL HIGH (ref 11.5–15.5)
WBC: 9 10*3/uL (ref 4.0–10.5)

## 2016-08-21 LAB — COMPREHENSIVE METABOLIC PANEL
ALT: 9 U/L — ABNORMAL LOW (ref 14–54)
AST: 24 U/L (ref 15–41)
Albumin: 2.7 g/dL — ABNORMAL LOW (ref 3.5–5.0)
Alkaline Phosphatase: 198 U/L — ABNORMAL HIGH (ref 38–126)
Anion gap: 10 (ref 5–15)
CHLORIDE: 106 mmol/L (ref 101–111)
CO2: 19 mmol/L — AB (ref 22–32)
CREATININE: 0.51 mg/dL (ref 0.44–1.00)
Calcium: 8.9 mg/dL (ref 8.9–10.3)
GFR calc Af Amer: >60 mL/min (ref >60)
Glucose, Bld: 78 mg/dL (ref 65–99)
Potassium: 3.8 mmol/L (ref 3.5–5.1)
SODIUM: 135 mmol/L (ref 135–145)
Total Bilirubin: 0.8 mg/dL (ref 0.3–1.2)
Total Protein: 7.1 g/dL (ref 6.5–8.1)

## 2016-08-21 LAB — PROTEIN / CREATININE RATIO, URINE
Creatinine, Urine: 58 mg/dL
Protein Creatinine Ratio: 0.12 mg/mg{Cre} (ref 0.00–0.15)
Total Protein, Urine: 7 mg/dL

## 2016-08-21 MED ORDER — LACTATED RINGERS IV SOLN
INTRAVENOUS | Status: DC
  Administered 2016-08-21 – 2016-08-22 (×2): via INTRAVENOUS

## 2016-08-21 MED ORDER — FENTANYL CITRATE (PF) 100 MCG/2ML IJ SOLN
50.0000 ug | Freq: Once | INTRAMUSCULAR | Status: AC
  Administered 2016-08-21: 50 ug via INTRAVENOUS
  Filled 2016-08-21: qty 2

## 2016-08-21 NOTE — MAU Note (Signed)
Pt arrive via EMS with c/o contractions that started today at 7pm. Pt states baby is moving normally. Pt denies bleeding and leaking of fluid.

## 2016-08-22 ENCOUNTER — Encounter (HOSPITAL_COMMUNITY): Payer: Self-pay | Admitting: *Deleted

## 2016-08-22 DIAGNOSIS — O0930 Supervision of pregnancy with insufficient antenatal care, unspecified trimester: Secondary | ICD-10-CM

## 2016-08-22 DIAGNOSIS — Z3A37 37 weeks gestation of pregnancy: Secondary | ICD-10-CM

## 2016-08-22 DIAGNOSIS — O139 Gestational [pregnancy-induced] hypertension without significant proteinuria, unspecified trimester: Secondary | ICD-10-CM | POA: Diagnosis present

## 2016-08-22 DIAGNOSIS — O134 Gestational [pregnancy-induced] hypertension without significant proteinuria, complicating childbirth: Secondary | ICD-10-CM

## 2016-08-22 LAB — TYPE AND SCREEN
ABO/RH(D): O POS
Antibody Screen: NEGATIVE

## 2016-08-22 LAB — RPR: RPR Ser Ql: NONREACTIVE

## 2016-08-22 LAB — HIV ANTIBODY (ROUTINE TESTING W REFLEX): HIV Screen 4th Generation wRfx: NONREACTIVE

## 2016-08-22 MED ORDER — OXYTOCIN BOLUS FROM INFUSION
500.0000 mL | Freq: Once | INTRAVENOUS | Status: AC
  Administered 2016-08-22: 500 mL via INTRAVENOUS

## 2016-08-22 MED ORDER — ACETAMINOPHEN 325 MG PO TABS
650.0000 mg | ORAL_TABLET | ORAL | Status: DC | PRN
  Administered 2016-08-22 – 2016-08-23 (×3): 650 mg via ORAL
  Filled 2016-08-22 (×4): qty 2

## 2016-08-22 MED ORDER — PRENATAL MULTIVITAMIN CH
1.0000 | ORAL_TABLET | Freq: Every day | ORAL | Status: DC
  Administered 2016-08-23 – 2016-08-24 (×2): 1 via ORAL
  Filled 2016-08-22 (×2): qty 1

## 2016-08-22 MED ORDER — OXYCODONE-ACETAMINOPHEN 5-325 MG PO TABS: 2.0000 | ORAL_TABLET | ORAL | Status: DC | PRN

## 2016-08-22 MED ORDER — TERBUTALINE SULFATE 1 MG/ML IJ SOLN
0.2500 mg | Freq: Once | INTRAMUSCULAR | Status: DC | PRN
  Filled 2016-08-22: qty 1

## 2016-08-22 MED ORDER — FENTANYL CITRATE (PF) 100 MCG/2ML IJ SOLN
100.0000 ug | Freq: Once | INTRAMUSCULAR | Status: AC
  Administered 2016-08-22: 100 ug via INTRAVENOUS
  Filled 2016-08-22: qty 2

## 2016-08-22 MED ORDER — COCONUT OIL OIL: 1.0000 "application " | TOPICAL_OIL | Status: DC | PRN

## 2016-08-22 MED ORDER — DIBUCAINE 1 % RE OINT: 1.0000 "application " | TOPICAL_OINTMENT | RECTAL | Status: DC | PRN

## 2016-08-22 MED ORDER — ONDANSETRON HCL 4 MG PO TABS: 4.0000 mg | ORAL_TABLET | ORAL | Status: DC | PRN

## 2016-08-22 MED ORDER — LIDOCAINE HCL (PF) 1 % IJ SOLN
30.0000 mL | INTRAMUSCULAR | Status: DC | PRN
  Filled 2016-08-22: qty 30

## 2016-08-22 MED ORDER — SENNOSIDES-DOCUSATE SODIUM 8.6-50 MG PO TABS
2.0000 | ORAL_TABLET | ORAL | Status: DC
  Administered 2016-08-22 – 2016-08-23 (×2): 2 via ORAL
  Filled 2016-08-22 (×2): qty 2

## 2016-08-22 MED ORDER — FENTANYL CITRATE (PF) 100 MCG/2ML IJ SOLN
100.0000 ug | INTRAMUSCULAR | Status: DC | PRN
  Administered 2016-08-22 (×2): 100 ug via INTRAVENOUS
  Filled 2016-08-22: qty 2

## 2016-08-22 MED ORDER — ZOLPIDEM TARTRATE 5 MG PO TABS: 5.0000 mg | ORAL_TABLET | Freq: Every evening | ORAL | Status: DC | PRN

## 2016-08-22 MED ORDER — IBUPROFEN 600 MG PO TABS
600.0000 mg | ORAL_TABLET | Freq: Four times a day (QID) | ORAL | Status: DC
  Administered 2016-08-22 – 2016-08-24 (×9): 600 mg via ORAL
  Filled 2016-08-22 (×9): qty 1

## 2016-08-22 MED ORDER — OXYTOCIN 40 UNITS IN LACTATED RINGERS INFUSION - SIMPLE MED
1.0000 m[IU]/min | INTRAVENOUS | Status: DC
  Administered 2016-08-22: 2 m[IU]/min via INTRAVENOUS
  Filled 2016-08-22: qty 1000

## 2016-08-22 MED ORDER — OXYCODONE-ACETAMINOPHEN 5-325 MG PO TABS
1.0000 | ORAL_TABLET | ORAL | Status: DC | PRN
  Administered 2016-08-22: 1 via ORAL
  Filled 2016-08-22: qty 1

## 2016-08-22 MED ORDER — LACTATED RINGERS IV SOLN: 500.0000 mL | INTRAVENOUS | Status: DC | PRN

## 2016-08-22 MED ORDER — ONDANSETRON HCL 4 MG/2ML IJ SOLN: 4.0000 mg | INTRAMUSCULAR | Status: DC | PRN

## 2016-08-22 MED ORDER — BENZOCAINE-MENTHOL 20-0.5 % EX AERO
1.0000 "application " | INHALATION_SPRAY | CUTANEOUS | Status: DC | PRN
  Administered 2016-08-22: 1 via TOPICAL
  Filled 2016-08-22: qty 56

## 2016-08-22 MED ORDER — DEXTROSE 5 % IV SOLN
5.0000 10*6.[IU] | Freq: Once | INTRAVENOUS | Status: AC
  Administered 2016-08-22: 5 10*6.[IU] via INTRAVENOUS
  Filled 2016-08-22: qty 5

## 2016-08-22 MED ORDER — DIPHENHYDRAMINE HCL 25 MG PO CAPS: 25.0000 mg | ORAL_CAPSULE | Freq: Four times a day (QID) | ORAL | Status: DC | PRN

## 2016-08-22 MED ORDER — WITCH HAZEL-GLYCERIN EX PADS: 1.0000 "application " | MEDICATED_PAD | CUTANEOUS | Status: DC | PRN

## 2016-08-22 MED ORDER — SIMETHICONE 80 MG PO CHEW
80.0000 mg | CHEWABLE_TABLET | ORAL | Status: DC | PRN
Start: 2016-08-22 — End: 2016-08-24

## 2016-08-22 MED ORDER — SOD CITRATE-CITRIC ACID 500-334 MG/5ML PO SOLN: 30.0000 mL | ORAL | Status: DC | PRN

## 2016-08-22 MED ORDER — FAMOTIDINE 20 MG PO TABS
20.0000 mg | ORAL_TABLET | Freq: Two times a day (BID) | ORAL | Status: DC
  Administered 2016-08-22 – 2016-08-24 (×5): 20 mg via ORAL
  Filled 2016-08-22 (×5): qty 1

## 2016-08-22 MED ORDER — PENICILLIN G POT IN DEXTROSE 60000 UNIT/ML IV SOLN
3.0000 10*6.[IU] | INTRAVENOUS | Status: DC
  Administered 2016-08-22 (×2): 3 10*6.[IU] via INTRAVENOUS
  Filled 2016-08-22 (×3): qty 50

## 2016-08-22 MED ORDER — ACETAMINOPHEN 325 MG PO TABS
650.0000 mg | ORAL_TABLET | ORAL | Status: DC | PRN
  Administered 2016-08-22: 650 mg via ORAL
  Filled 2016-08-22: qty 2

## 2016-08-22 MED ORDER — TETANUS-DIPHTH-ACELL PERTUSSIS 5-2.5-18.5 LF-MCG/0.5 IM SUSP: 0.5000 mL | Freq: Once | INTRAMUSCULAR | Status: DC

## 2016-08-22 MED ORDER — OXYTOCIN 40 UNITS IN LACTATED RINGERS INFUSION - SIMPLE MED: 2.5000 [IU]/h | INTRAVENOUS | Status: DC

## 2016-08-22 MED ORDER — LACTATED RINGERS IV SOLN
INTRAVENOUS | Status: DC
  Administered 2016-08-22: 06:00:00 via INTRAVENOUS

## 2016-08-22 MED ORDER — FENTANYL CITRATE (PF) 100 MCG/2ML IJ SOLN
INTRAMUSCULAR | Status: AC
  Filled 2016-08-22: qty 2

## 2016-08-22 MED ORDER — ONDANSETRON HCL 4 MG/2ML IJ SOLN: 4.0000 mg | Freq: Four times a day (QID) | INTRAMUSCULAR | Status: DC | PRN

## 2016-08-22 NOTE — Progress Notes (Signed)
AROM to clear fluid at 0715. 6/50/-3.   FHT: 130 bpm, mod var, +accels, no decels TOCO: occasional  A/P: Labor: continue to inc pit per protocol Pain: epidural on request FWB: category I   Continue expectant management Anticipate SVD  Howard PouchLauren Annalia Metzger, MD PGY-2 [facility]

## 2016-08-22 NOTE — H&P (Signed)
Monique Gamble is a 26 y.o. female presenting for painful uterine contractions since 7pm.  .  RN Note: Pt arrive via EMS with c/o contractions that started today at 7pm. Pt states baby is moving normally. Pt denies bleeding and leaking of fluid.    Electronically signed by Mellody Dance, RN at 08/21/2016 9:58 PM     OB History    Gravida Para Term Preterm AB Living   6 4 4  0 1 4   SAB TAB Ectopic Multiple Live Births   0 1 0 0 4     Past Medical History:  Diagnosis Date  . ADHD (attention deficit hyperactivity disorder)   . Chlamydia   . Headache   . History of anemia   . Hypertension    Past Surgical History:  Procedure Laterality Date  . INDUCED ABORTION     Family History: family history includes Diabetes in her sister; Hypertension in her mother. Social History:  reports that she has quit smoking. Her smoking use included Cigars. She has never used smokeless tobacco. She reports that she uses drugs, including Marijuana. She reports that she does not drink alcohol.     Maternal Diabetes: No Genetic Screening: Declined Maternal Ultrasounds/Referrals: Declined Fetal Ultrasounds or other Referrals:  None Maternal Substance Abuse:  Yes:  Type: Marijuana Significant Maternal Medications:  None Significant Maternal Lab Results:  None Other Comments:  No prenatal care until this week  Review of Systems  Constitutional: Negative for chills, fever and malaise/fatigue.  Eyes: Negative for blurred vision.  Respiratory: Negative for shortness of breath.   Cardiovascular: Negative for leg swelling.  Gastrointestinal: Positive for abdominal pain. Negative for constipation, diarrhea, nausea and vomiting.  Genitourinary: Negative for dysuria.  Neurological: Negative for dizziness and focal weakness.   Maternal Medical History:  Reason for admission: Contractions.  Nausea.  Contractions: Onset was 3-5 hours ago.   Frequency: regular.   Perceived severity is moderate.     Fetal activity: Perceived fetal activity is normal.   Last perceived fetal movement was within the past hour.    Prenatal complications: PIH.   No bleeding or preterm labor.   Prenatal Complications - Diabetes: none.    Dilation: 6 Effacement (%): 70 Station: -2 Exam by:: christy white,rnc Blood pressure (!) 138/93, pulse 93, temperature 98.6 F (37 C), temperature source Oral, resp. rate 18, height 5\' 5"  (1.651 m), weight 183 lb (83 kg), last menstrual period 12/02/2015, SpO2 100 %, not currently breastfeeding. Maternal Exam:  Uterine Assessment: Contraction strength is moderate.  Contraction frequency is regular.   Abdomen: Patient reports no abdominal tenderness. Fundal height is 37.   Fetal presentation: vertex  Introitus: Normal vulva. Normal vagina.  Ferning test: not done.  Nitrazine test: not done. Amniotic fluid character: not assessed.  Pelvis: adequate for delivery.   Cervix: Cervix evaluated by digital exam.     Fetal Exam Fetal Monitor Review: Mode: ultrasound.   Baseline rate: 140.  Variability: moderate (6-25 bpm).   Pattern: accelerations present and no decelerations.    Fetal State Assessment: Category I - tracings are normal.     Vitals:   08/22/16 0330 08/22/16 0401 08/22/16 0430 08/22/16 0501  BP: 133/87 117/77 124/82 131/78  Pulse: 92 81 87 82  Resp: 18 18 18 18   Temp:   98 F (36.7 C)   TempSrc:   Oral   SpO2:      Weight:      Height:  Physical Exam  Constitutional: She is oriented to person, place, and time. She appears well-developed and well-nourished. No distress.  HENT:  Head: Normocephalic.  Cardiovascular: Normal rate and regular rhythm.   Respiratory: Effort normal and breath sounds normal. No respiratory distress.  GI: Soft. She exhibits no distension. There is no tenderness. There is no rebound and no guarding.  Genitourinary:  Genitourinary Comments: Cervix 6/70/-2/vtx  Musculoskeletal: Normal range of motion.   Neurological: She is alert and oriented to person, place, and time.  Skin: Skin is warm and dry.  Psychiatric: She has a normal mood and affect.    Prenatal labs: ABO, Rh: O/Positive/-- (07/03 1402) Antibody: Negative (07/03 1402) Rubella: <0.90 (06/19 2211) RPR: Non Reactive (06/19 2211)  HBsAg: Negative (06/19 2211)  HIV: Non Reactive (06/19 2211)  GBS:    Results for orders placed or performed during the hospital encounter of 08/21/16 (from the past 24 hour(s))  Protein / creatinine ratio, urine     Status: None   Collection Time: 08/21/16  9:15 PM  Result Value Ref Range   Creatinine, Urine 58.00 mg/dL   Total Protein, Urine 7 mg/dL   Protein Creatinine Ratio 0.12 0.00 - 0.15 mg/mg[Cre]  CBC     Status: Abnormal   Collection Time: 08/21/16  9:50 PM  Result Value Ref Range   WBC 9.0 4.0 - 10.5 K/uL   RBC 3.98 3.87 - 5.11 MIL/uL   Hemoglobin 8.9 (L) 12.0 - 15.0 g/dL   HCT 16.129.7 (L) 09.636.0 - 04.546.0 %   MCV 74.6 (L) 78.0 - 100.0 fL   MCH 22.4 (L) 26.0 - 34.0 pg   MCHC 30.0 30.0 - 36.0 g/dL   RDW 40.916.0 (H) 81.111.5 - 91.415.5 %   Platelets 242 150 - 400 K/uL  Comprehensive metabolic panel     Status: Abnormal   Collection Time: 08/21/16  9:50 PM  Result Value Ref Range   Sodium 135 135 - 145 mmol/L   Potassium 3.8 3.5 - 5.1 mmol/L   Chloride 106 101 - 111 mmol/L   CO2 19 (L) 22 - 32 mmol/L   Glucose, Bld 78 65 - 99 mg/dL   BUN <5 (L) 6 - 20 mg/dL   Creatinine, Ser 7.820.51 0.44 - 1.00 mg/dL   Calcium 8.9 8.9 - 95.610.3 mg/dL   Total Protein 7.1 6.5 - 8.1 g/dL   Albumin 2.7 (L) 3.5 - 5.0 g/dL   AST 24 15 - 41 U/L   ALT 9 (L) 14 - 54 U/L   Alkaline Phosphatase 198 (H) 38 - 126 U/L   Total Bilirubin 0.8 0.3 - 1.2 mg/dL   GFR calc non Af Amer >60 >60 mL/min   GFR calc Af Amer >60 >60 mL/min   Anion gap 10 5 - 15  Type and screen [facility]     Status: None   Collection Time: 08/21/16  9:50 PM  Result Value Ref Range   ABO/RH(D) O POS    Antibody Screen NEG    Sample  Expiration 08/24/2016     Assessment/Plan: Single IUP at 5873w5d Active Labor Several elevated BPs on admission, there were also BP elevations on 08/17/16 Gestational Hypertension  [facility] Anticipate SVD   Wynelle BourgeoisMarie Williams 08/22/2016, 12:02 AM

## 2016-08-23 MED ORDER — IBUPROFEN 600 MG PO TABS: 600.0000 mg | ORAL_TABLET | Freq: Four times a day (QID) | ORAL | 0 refills | Status: DC

## 2016-08-23 MED ORDER — PRENATAL MULTIVITAMIN CH: 1.0000 | ORAL_TABLET | Freq: Every day | ORAL | 10 refills | Status: DC

## 2016-08-23 MED ORDER — SENNOSIDES-DOCUSATE SODIUM 8.6-50 MG PO TABS: 2.0000 | ORAL_TABLET | ORAL | 0 refills | Status: DC

## 2016-08-23 NOTE — Progress Notes (Signed)
POSTPARTUM PROGRESS NOTE  Post Partum Day #1 SVD  Subjective:  Monique Gamble is a 26 y.o. [id number] 7458w5d s/p SVD.  No acute events overnight.  Pt denies problems with ambulating, voiding or po intake.  She denies nausea or vomiting.  Pain is moderately controlled.  She has had flatus. She has not had bowel movement.  Lochia Small.   Objective: Blood pressure 135/90, pulse 74, temperature 98.1 F (36.7 C), temperature source Oral, resp. rate 18, height 5\' 5"  (1.651 m), weight 83 kg (183 lb), last menstrual period 12/02/2015, SpO2 100 %, unknown if currently breastfeeding.  Physical Exam:  General: alert, cooperative and no distress Lochia:normal flow Uterine Fundus: firm, DVT Evaluation: No calf swelling or tenderness Extremities: no LE edema   Recent Labs  08/21/16 2150  HGB 8.9*  HCT 29.7*    Assessment/Plan:  ASSESSMENT: Monique Gamble is a 26 y.o. [id number] 1458w5d s/p SVD  Plan for discharge tomorrow   LOS: 2 days   Howard PouchLauren Feng, MD PGY-1 [facility]  08/23/2016, 6:51 AM    OB FELLOW POSTPARTUM PROGRESS NOTE ATTESTATION  I confirm that I have verified the information documented in the resident's note and that I have also personally reperformed the physical exam and all medical decision making activities.     Ernestina PennaNicholas Kwan Shellhammer, MD 9:31 AM

## 2016-08-23 NOTE — Clinical Social Work Maternal (Signed)
CLINICAL SOCIAL WORK MATERNAL/CHILD NOTE  Patient Details  Name: Monique Gamble MRN: [id number] Date of Birth: 1990/06/17  Date:  08/23/2016  Clinical Social Worker Initiating Note:  Laurey Arrow Date/ Time Initiated:  08/23/16/1100     Child's Name:  Monique Gamble   Legal Guardian:  Mother (FOB is Monique Gamble 08/27/87)   Need for Interpreter:  None   Date of Referral:  08/22/16     Reason for Referral:  Current Substance Use/Substance Use During Pregnancy , Late or No Prenatal Care  (hx of THC use. )   Referral Source:  Central Nursery   Address:  [address] [place] [address]  Phone number:  [phone]   Household Members:  Self, Minor Children ([name]'s children are Monique Gamble 10/20/07, Monique Gamble 08/15/11, Monique Stimpson1/28/16, and Monique Gamble 01/21/15)   Natural Supports (not living in the home):  Spouse/significant other, Immediate Family, Parent (FOB's family will also be source of support.)   Professional Supports: None   Employment: Disabled   Type of Work:     Education:  9 to 11 years   Museum/gallery curator Resources:  [employer], Community education officer   Other Resources:  [employer], Physicist, medical    Cultural/Religious Considerations Which May Impact Care:  Per [employer], [name] is Monique Gamble.   Strengths:  Pediatrician chosen , Home prepared for child    Risk Factors/Current Problems:  Mental Health Concerns , Substance Use    Cognitive State:  Alert , Able to Concentrate , Linear Thinking , Insightful    Mood/Affect:  Bright , Happy , Interested , Comfortable , Relaxed    CSW Assessment: CSW met with [name] to complete an assessment for hx of substance use and NPNC.  When CSW arrived, [name] was attaching and bonding with infant as evident by [name] engaging in skin to skin.  [name] gave CSW permission to ask [name]'s guest to leave in effort to meet with [name] in private. [name] was inviting and interested in meeting with CSW.   CSW inquired  about [name]'s substance use and [name] acknowledged the use of marijuana during pregnancy.  [name] denied the use of all other illicit substance.  [name] reported [name]'s last use of marijuana was May 2018. CSW offered [name] SA resources and [name] declined.  CSW informed [name] of the hospital's policy and procedures regarding perinatal SA. [name] reported the [name] was familiar with policy due to [name] experiencing CSW consult with baby number 4 in 2016. [name] was made aware of the 2 drug screenings for the infant.  [name] was understanding and did not have any questions or concerns. CSW informed [name] that the infant's UDS is  negative and CSW will continue to monitor infant's CDS.  CSW explained to [name] if infant's CDS is positive without and explanation, CSW will make a report to [facility] CPS. [name] acknowledged a CPS report stemming from [name]'s last pregnancy positive drug screens.  [name] was aware of CPS process. [name] reported smoking marijuana to assist [name] with increasing [name]'s mood. [name] denied a MH hx, however reported [name] receives SSI for having a learning disability.  CSW offered [name] resources for outpatient services and [name] declined.  [name] also denied PPD signs and symptoms with [name]'s older 4 children.   CSW asked about [name]'s lack of PNC and [name] reported that childcare was barrier for [name] to attend prenatal appointments.  [name] denied barriers for follow-up appointments for [name] and infant.  [name] reports being prepared to parent and  overall feeling good.    CSW provided SIDS education and [name] was knowledgeable.  However, [name] reported not having a safe sleeping area for infant.  CSW provided [name] with information to obtain a Baby Box; [name] agreed to complete online requirements and to provided bedside nurse with confirmation code in order to receive a baby box. CSW informed bedside of [name]'s request for a baby box.  CSW thanked [name] for meeting with CSW and provided [name] with CSW's contact information.   There are no barriers to d/c.    CSW Plan/Description:  Information/Referral to [employer] , [employer] , No Further Intervention Required/No Barriers to Discharge (CSW will monitor infant's CDS and will make a report if warranted. )   Laurey Arrow, MSW, LCSW Clinical Social Work [phone]  Dimple Nanas, LCSW 08/23/2016, 11:32 AM

## 2016-08-23 NOTE — Discharge Instructions (Signed)

## 2016-08-24 MED ORDER — MEDROXYPROGESTERONE ACETATE 150 MG/ML IM SUSP
150.0000 mg | Freq: Once | INTRAMUSCULAR | Status: DC
Start: 2016-08-24 — End: 2016-08-24

## 2016-08-24 NOTE — Discharge Summary (Signed)
OB Discharge Summary     Patient Name: Monique Gamble DOB: 11-06-1990 MRN: [id number]  Date of admission: 08/21/2016 Delivering MD: Ernestina PennaSCHENK, NICHOLAS MICHAEL   Date of discharge: 08/24/2016  Admitting diagnosis: 37 WKS CTX Intrauterine pregnancy: 1071w5d     Secondary diagnosis:  Active Problems:   No prenatal care in current pregnancy   Gestational hypertension  Additional problems: grandmultip; BTL papers signed on 08/18/16     Discharge diagnosis: Term Pregnancy Delivered and Gestational Hypertension                                                                                                Post partum procedures:none  Augmentation: AROM  Complications: None  Hospital course:  Onset of Labor With Vaginal Delivery     26 y.o. yo [id number] at 571w5d was admitted in Active Labor on 08/21/2016. Patient had an uncomplicated labor course as follows:  Membrane Rupture Time/Date: 7:15 AM ,08/22/2016   Intrapartum Procedures: Episiotomy: None [1]                                         Lacerations:  None [1]  Patient had a delivery of a Viable infant. 08/22/2016  Information for the patient's newborn:  Monique Gamble, Boy Letisia [[id number]][[id number]]  Delivery Method: Vaginal, Spontaneous Delivery (Filed from Delivery Summary)    Pateint had an uncomplicated postpartum course.  She is ambulating, tolerating a regular diet, passing flatus, and urinating well. She had a SW visit during her stay and there are no barriers to d/c. Patient is discharged home in stable condition on 08/24/16.   Physical exam  Vitals:   08/22/16 1820 08/23/16 0554 08/23/16 2015 08/24/16 0637  BP:  135/90 (!) 142/78 138/84  Pulse: 84 74 74 80  Resp: 18 18 20 18   Temp: 98.6 F (37 C) 98.1 F (36.7 C) 98.4 F (36.9 C) 98.6 F (37 C)  TempSrc: Oral Oral Oral Oral  SpO2:      Weight:      Height:       General: alert and cooperative Lochia: appropriate Uterine Fundus: firm Incision: N/A DVT Evaluation: No evidence  of DVT seen on physical exam. Labs: Lab Results  Component Value Date   WBC 9.0 08/21/2016   HGB 8.9 (L) 08/21/2016   HCT 29.7 (L) 08/21/2016   MCV 74.6 (L) 08/21/2016   PLT 242 08/21/2016   CMP Latest Ref Rng & Units 08/21/2016  Glucose 65 - 99 mg/dL 78  BUN 6 - 20 mg/dL <7(Q<5(L)  Creatinine 4.690.44 - 1.00 mg/dL 6.290.51  Sodium 528135 - 413145 mmol/L 135  Potassium 3.5 - 5.1 mmol/L 3.8  Chloride 101 - 111 mmol/L 106  CO2 22 - 32 mmol/L 19(L)  Calcium 8.9 - 10.3 mg/dL 8.9  Total Protein 6.5 - 8.1 g/dL 7.1  Total Bilirubin 0.3 - 1.2 mg/dL 0.8  Alkaline Phos 38 - 126 U/L 198(H)  AST 15 - 41 U/L 24  ALT 14 - 54 U/L 9(L)  Discharge instruction: per After Visit Summary and "Baby and Me Booklet".  After visit meds:  Allergies as of 08/24/2016   No Known Allergies     Medication List    TAKE these medications   calcium carbonate 500 MG chewable tablet Commonly known as:  TUMS - dosed in mg elemental calcium Chew 1-2 tablets by mouth daily.   ibuprofen 600 MG tablet Commonly known as:  ADVIL,MOTRIN Take 1 tablet (600 mg total) by mouth every 6 (six) hours.   prenatal multivitamin Tabs tablet Take 1 tablet by mouth daily at 12 noon.   senna-docusate 8.6-50 MG tablet Commonly known as:  Senokot-S Take 2 tablets by mouth daily.       Diet: routine diet  Activity: Advance as tolerated. Pelvic rest for 6 weeks.   Outpatient follow up:1-2 weeks for a BP check and a pre-op MD visit Follow up Appt:Future Appointments Date Time Provider Department Center  08/25/2016 11:15 AM [facility] [place] 4 [facility]  08/26/2016 10:00 AM Armando Reichert, CNM [facility]   Follow up Visit:No Follow-up on file.  Postpartum contraception: Depo Provera and Tubal Ligation  Newborn Data: Live born female  Birth Weight: 6 lb 13.7 oz (3110 g) APGAR: 9, 9  Baby Feeding: Bottle Disposition:home with mother   08/24/2016 Cam Hai, CNM  7:10 AM

## 2016-08-24 NOTE — Progress Notes (Signed)
UR chart review completed.  

## 2016-08-25 ENCOUNTER — Ambulatory Visit (HOSPITAL_COMMUNITY): Payer: Medicaid Other

## 2016-08-26 ENCOUNTER — Encounter: Payer: Medicaid Other | Admitting: Advanced Practice Midwife

## 2016-09-01 ENCOUNTER — Encounter (HOSPITAL_COMMUNITY): Payer: Self-pay

## 2016-09-01 ENCOUNTER — Telehealth: Payer: Self-pay | Admitting: *Deleted

## 2016-09-01 ENCOUNTER — Ambulatory Visit (INDEPENDENT_AMBULATORY_CARE_PROVIDER_SITE_OTHER): Payer: Medicaid Other | Admitting: General Practice

## 2016-09-01 ENCOUNTER — Inpatient Hospital Stay (HOSPITAL_COMMUNITY)
Admission: AD | Admit: 2016-09-01 | Discharge: 2016-09-03 | DRG: 776 | Disposition: A | Discharge status: Home / Self Care | Payer: Medicaid Other | Source: Ambulatory Visit | Attending: Family Medicine | Admitting: Family Medicine

## 2016-09-01 VITALS — BP 156/92 | HR 77 | Ht 66.0 in | Wt 170.0 lb

## 2016-09-01 DIAGNOSIS — Z87891 Personal history of nicotine dependence: Secondary | ICD-10-CM

## 2016-09-01 DIAGNOSIS — O09299 Supervision of pregnancy with other poor reproductive or obstetric history, unspecified trimester: Secondary | ICD-10-CM | POA: Diagnosis present

## 2016-09-01 DIAGNOSIS — Z3042 Encounter for surveillance of injectable contraceptive: Secondary | ICD-10-CM

## 2016-09-01 DIAGNOSIS — Z30011 Encounter for initial prescription of contraceptive pills: Secondary | ICD-10-CM

## 2016-09-01 DIAGNOSIS — O1495 Unspecified pre-eclampsia, complicating the puerperium: Principal | ICD-10-CM | POA: Diagnosis present

## 2016-09-01 LAB — COMPREHENSIVE METABOLIC PANEL
ALBUMIN: 2.9 g/dL — AB (ref 3.5–5.0)
ALK PHOS: 138 U/L — AB (ref 38–126)
ALT: 33 U/L (ref 14–54)
ANION GAP: 7 (ref 5–15)
AST: 57 U/L — AB (ref 15–41)
BUN: 11 mg/dL (ref 6–20)
CALCIUM: 8.9 mg/dL (ref 8.9–10.3)
CO2: 25 mmol/L (ref 22–32)
CREATININE: 0.64 mg/dL (ref 0.44–1.00)
Chloride: 106 mmol/L (ref 101–111)
GFR calc Af Amer: >60 mL/min (ref >60)
GFR calc non Af Amer: >60 mL/min (ref >60)
GLUCOSE: 90 mg/dL (ref 65–99)
Potassium: 4.6 mmol/L (ref 3.5–5.1)
SODIUM: 138 mmol/L (ref 135–145)
Total Bilirubin: 0.9 mg/dL (ref 0.3–1.2)
Total Protein: 7.4 g/dL (ref 6.5–8.1)

## 2016-09-01 LAB — CBC
HCT: 31.8 % — ABNORMAL LOW (ref 36.0–46.0)
HEMOGLOBIN: 9.5 g/dL — AB (ref 12.0–15.0)
MCH: 22.5 pg — AB (ref 26.0–34.0)
MCHC: 29.9 g/dL — AB (ref 30.0–36.0)
MCV: 75.2 fL — ABNORMAL LOW (ref 78.0–100.0)
Platelets: 254 10*3/uL (ref 150–400)
RBC: 4.23 MIL/uL (ref 3.87–5.11)
RDW: 17 % — ABNORMAL HIGH (ref 11.5–15.5)
WBC: 8.7 10*3/uL (ref 4.0–10.5)

## 2016-09-01 LAB — PROTEIN / CREATININE RATIO, URINE
CREATININE, URINE: 164 mg/dL
Protein Creatinine Ratio: 0.11 mg/mg{Cre} (ref 0.00–0.15)
Total Protein, Urine: 18 mg/dL

## 2016-09-01 MED ORDER — MAGNESIUM SULFATE 40 G IN LACTATED RINGERS - SIMPLE
2.0000 g/h | INTRAVENOUS | Status: DC
  Administered 2016-09-02: 2 g/h via INTRAVENOUS
  Filled 2016-09-01: qty 40
  Filled 2016-09-01: qty 500

## 2016-09-01 MED ORDER — LACTATED RINGERS IV SOLN
INTRAVENOUS | Status: DC
  Administered 2016-09-01 – 2016-09-02 (×3): via INTRAVENOUS

## 2016-09-01 MED ORDER — ENALAPRIL MALEATE 10 MG PO TABS
10.0000 mg | ORAL_TABLET | Freq: Once | ORAL | Status: AC
  Administered 2016-09-01: 10 mg via ORAL
  Filled 2016-09-01: qty 1

## 2016-09-01 MED ORDER — ACETAMINOPHEN 325 MG PO TABS
650.0000 mg | ORAL_TABLET | Freq: Four times a day (QID) | ORAL | Status: DC | PRN
  Administered 2016-09-01 – 2016-09-02 (×3): 650 mg via ORAL
  Filled 2016-09-01 (×3): qty 2

## 2016-09-01 MED ORDER — HYDRALAZINE HCL 20 MG/ML IJ SOLN
10.0000 mg | Freq: Once | INTRAMUSCULAR | Status: AC | PRN
  Administered 2016-09-01: 10 mg via INTRAVENOUS
  Filled 2016-09-01: qty 1

## 2016-09-01 MED ORDER — MAGNESIUM SULFATE BOLUS VIA INFUSION
4.0000 g | Freq: Once | INTRAVENOUS | Status: AC
  Administered 2016-09-01: 4 g via INTRAVENOUS
  Filled 2016-09-01: qty 500

## 2016-09-01 MED ORDER — ENALAPRIL MALEATE 10 MG PO TABS
10.0000 mg | ORAL_TABLET | Freq: Every day | ORAL | Status: DC
  Administered 2016-09-02 – 2016-09-03 (×2): 10 mg via ORAL
  Filled 2016-09-01 (×3): qty 1

## 2016-09-01 MED ORDER — MEDROXYPROGESTERONE ACETATE 150 MG/ML IM SUSP
150.0000 mg | Freq: Once | INTRAMUSCULAR | Status: AC
  Administered 2016-09-01: 150 mg via INTRAMUSCULAR

## 2016-09-01 MED ORDER — ACETAMINOPHEN 650 MG RE SUPP: 650.0000 mg | Freq: Four times a day (QID) | RECTAL | Status: DC | PRN

## 2016-09-01 MED ORDER — LABETALOL HCL 5 MG/ML IV SOLN
20.0000 mg | INTRAVENOUS | Status: AC | PRN
  Administered 2016-09-01: 80 mg via INTRAVENOUS
  Administered 2016-09-01: 20 mg via INTRAVENOUS
  Administered 2016-09-01: 40 mg via INTRAVENOUS
  Filled 2016-09-01: qty 16
  Filled 2016-09-01: qty 8
  Filled 2016-09-01: qty 4

## 2016-09-01 MED ORDER — ONDANSETRON HCL 4 MG/2ML IJ SOLN: 4.0000 mg | Freq: Four times a day (QID) | INTRAMUSCULAR | Status: DC | PRN

## 2016-09-01 MED ORDER — ONDANSETRON HCL 4 MG PO TABS: 4.0000 mg | ORAL_TABLET | Freq: Four times a day (QID) | ORAL | Status: DC | PRN

## 2016-09-01 MED ORDER — ACETAMINOPHEN 500 MG PO TABS
1000.0000 mg | ORAL_TABLET | Freq: Once | ORAL | Status: AC
  Administered 2016-09-01: 1000 mg via ORAL
  Filled 2016-09-01: qty 2

## 2016-09-01 NOTE — Telephone Encounter (Signed)
Patient recently delivered on 7/7. She is calling to find out if she can come get her depo injection prior to her postpartum visit. Advised patient that I will discuss this with a provider and call her to let her know. Patient is agreeable to this.

## 2016-09-01 NOTE — Progress Notes (Addendum)
PP 1 wk. OB clinic sent pt up due to elevated BP and pt c/o ha since going home from hospital.   1637: lab at bs.   See flow sheet for BP reading.   1647: IV started.   1649: Labetalol administered per order. Flushed with NS  1755: see MAR for labetalol dosing times.   1816: Hydralazine given. See MAR for time. Pt states ha gone.   1827: Provider made aware of pt's critically high bp. Pt denies symptoms of HTN.   1843: order received to admit pt upstairs.  1848: Press photographerCharge nurse notified. Report status of pt given. Room assigned to 304  1852: up to bathroom. Voided without problems.   1900: Pt to 3rd floor via wheelchair.

## 2016-09-01 NOTE — Telephone Encounter (Signed)
Per [name] patient may come in for depo injection as long as she has not been sexually active. Patient to come this afternoon for injection.

## 2016-09-01 NOTE — MAU Provider Note (Signed)
History     CSN: [id number]  Arrival date and time: 09/01/16 1606  First Provider Initiated Contact with Patient 09/01/16 1642      Chief Complaint  Patient presents with  . Headache   HPI EMILLEE TALSMA is a 26 y.o. [id number] who presents from office for headache and hypertension. Patient is 10 days s/p SVD. Patient with history of preeclampsia in last pregnancy (2017) & gestational hypertension in most recent pregnancy; was not discharged home on antihypertensives. Presented to [facility] this morning for depo injection & was found to have severe range BP in presence of headache. Current headache x 2 days. Left frontal headache that is constant throbbing. Rates pain 7/10. Has been taking ibuprofen without relief. Denies blurred vision, epigastric pain, chest pain, or SOB.   OB History    Gravida Para Term Preterm AB Living   6 5 5  0 1 5   SAB TAB Ectopic Multiple Live Births   0 1 0 0 5      Past Medical History:  Diagnosis Date  . ADHD (attention deficit hyperactivity disorder)   . Chlamydia   . Headache   . History of anemia   . Hypertension     Past Surgical History:  Procedure Laterality Date  . INDUCED ABORTION      Family History  Problem Relation Age of Onset  . Hypertension Mother   . Diabetes Sister   . Anesthesia problems Neg Hx     Social History  Substance Use Topics  . Smoking status: Former Smoker    Types: Cigars  . Smokeless tobacco: Never Used  . Alcohol use No    Allergies: No Known Allergies  Prescriptions Prior to Admission  Medication Sig Dispense Refill Last Dose  . calcium carbonate (TUMS - DOSED IN MG ELEMENTAL CALCIUM) 500 MG chewable tablet Chew 1-2 tablets by mouth daily.   Past Week at Unknown time  . ibuprofen (ADVIL,MOTRIN) 600 MG tablet Take 1 tablet (600 mg total) by mouth every 6 (six) hours. 30 tablet 0   . Prenatal Vit-Fe Fumarate-FA (PRENATAL MULTIVITAMIN) TABS tablet Take 1 tablet by mouth daily at 12 noon. 30 tablet 10    . senna-docusate (SENOKOT-S) 8.6-50 MG tablet Take 2 tablets by mouth daily. 30 tablet 0     Review of Systems  Constitutional: Negative.   Eyes: Negative for photophobia and visual disturbance.  Respiratory: Negative for shortness of breath.   Cardiovascular: Negative for chest pain and leg swelling.  Gastrointestinal: Negative.   Genitourinary: Negative.   Neurological: Positive for headaches.   Physical Exam   Blood pressure (!) 175/87, pulse 61, temperature 99 F (37.2 C), temperature source Oral, resp. rate 18, unknown if currently breastfeeding.  Patient Vitals for the past 24 hrs:  BP Temp Temp src Pulse Resp  09/01/16 1822 (!) 173/100 - - 91 -  09/01/16 1817 (!) 193/110 - - 75 -  09/01/16 1813 (!) 186/100 - - - -  09/01/16 1811 - - - 72 -  09/01/16 1800 (!) 175/99 - - 74 -  09/01/16 1751 (!) 187/90 - - 71 -  09/01/16 1740 (!) 184/87 - - 64 -  09/01/16 1730 (!) 169/100 - - 67 -  09/01/16 1725 (!) 189/89 - - 67 -  09/01/16 1720 (!) 183/88 - - 68 -  09/01/16 1710 (!) 174/108 - - 69 -  09/01/16 1701 (!) 151/98 - - 79 -  09/01/16 1654 (!) 163/100 - - 66 -  09/01/16  1645 (!) 181/106 - - 72 -  09/01/16 1636 (!) 168/98 - - 75 -  09/01/16 1635 (!) 175/87 - - 61 -  09/01/16 1634 - 99 F (37.2 C) Oral - 18     Physical Exam  Nursing note and vitals reviewed. Constitutional: She is oriented to person, place, and time. She appears well-developed and well-nourished. No distress.  HENT:  Head: Normocephalic and atraumatic.  Eyes: Conjunctivae are normal. Right eye exhibits no discharge. Left eye exhibits no discharge. No scleral icterus.  Neck: Normal range of motion.  Cardiovascular: Normal rate, regular rhythm and normal heart sounds.   No murmur heard. Respiratory: Effort normal and breath sounds normal. No respiratory distress. She has no wheezes.  GI: Soft. Bowel sounds are normal. She exhibits no distension. There is no tenderness.  Musculoskeletal: She exhibits no  edema.  Neurological: She is alert and oriented to person, place, and time. She has normal reflexes.  No clonus  Skin: Skin is warm and dry. She is not diaphoretic.  Psychiatric: She has a normal mood and affect. Her behavior is normal. Judgment and thought content normal.   MAU Course  Procedures Results for orders placed or performed during the hospital encounter of 09/01/16 (from the past 24 hour(s))  Protein / creatinine ratio, urine     Status: None   Collection Time: 09/01/16  4:25 PM  Result Value Ref Range   Creatinine, Urine 164.00 mg/dL   Total Protein, Urine 18 mg/dL   Protein Creatinine Ratio 0.11 0.00 - 0.15 mg/mg[Cre]  Comprehensive metabolic panel     Status: Abnormal   Collection Time: 09/01/16  4:34 PM  Result Value Ref Range   Sodium 138 135 - 145 mmol/L   Potassium 4.6 3.5 - 5.1 mmol/L   Chloride 106 101 - 111 mmol/L   CO2 25 22 - 32 mmol/L   Glucose, Bld 90 65 - 99 mg/dL   BUN 11 6 - 20 mg/dL   Creatinine, Ser 0.980.64 0.44 - 1.00 mg/dL   Calcium 8.9 8.9 - 11.910.3 mg/dL   Total Protein 7.4 6.5 - 8.1 g/dL   Albumin 2.9 (L) 3.5 - 5.0 g/dL   AST 57 (H) 15 - 41 U/L   ALT 33 14 - 54 U/L   Alkaline Phosphatase 138 (H) 38 - 126 U/L   Total Bilirubin 0.9 0.3 - 1.2 mg/dL   GFR calc non Af Amer >60 >60 mL/min   GFR calc Af Amer >60 >60 mL/min   Anion gap 7 5 - 15  CBC     Status: Abnormal   Collection Time: 09/01/16  4:34 PM  Result Value Ref Range   WBC 8.7 4.0 - 10.5 K/uL   RBC 4.23 3.87 - 5.11 MIL/uL   Hemoglobin 9.5 (L) 12.0 - 15.0 g/dL   HCT 14.731.8 (L) 82.936.0 - 56.246.0 %   MCV 75.2 (L) 78.0 - 100.0 fL   MCH 22.5 (L) 26.0 - 34.0 pg   MCHC 29.9 (L) 30.0 - 36.0 g/dL   RDW 13.017.0 (H) 86.511.5 - 78.415.5 %   Platelets 254 150 - 400 K/uL    MDM Labetalol protocol ordered for severe range BPs & PIH labs pending. Tylenol 1 gm PO given for headache while waiting for results.  Headache resolved  BPs remain severe range after doses labetalol x 3 doses IV, PO vasotec, & IV hydral S/w  Dr. Adrian BlackwaterStinson. Will admit for postpartum mag Assessment and Plan  A: 1. Preeclampsia in postpartum period  P: Place in observation bed Mag sulfate Monitor BPs  Judeth Horn 09/01/2016, 4:42 PM

## 2016-09-01 NOTE — Progress Notes (Signed)
Patient came in today for depo one week after delivery per Vonzella NippleJulie Wenzel okay to give. Patient reports headaches since going home- blood pressures elevated today. Per Dr Vergie LivingPickens, patient should go to MAU. MAU notified & patient escorted upstairs

## 2016-09-02 DIAGNOSIS — O1495 Unspecified pre-eclampsia, complicating the puerperium: Secondary | ICD-10-CM | POA: Diagnosis not present

## 2016-09-02 DIAGNOSIS — Z87891 Personal history of nicotine dependence: Secondary | ICD-10-CM | POA: Diagnosis not present

## 2016-09-02 LAB — COMPREHENSIVE METABOLIC PANEL
ALK PHOS: 128 U/L — AB (ref 38–126)
ALT: 22 U/L (ref 14–54)
ANION GAP: 9 (ref 5–15)
AST: 62 U/L — ABNORMAL HIGH (ref 15–41)
Albumin: 2.8 g/dL — ABNORMAL LOW (ref 3.5–5.0)
BILIRUBIN TOTAL: 0.4 mg/dL (ref 0.3–1.2)
BUN: 7 mg/dL (ref 6–20)
CALCIUM: 7 mg/dL — AB (ref 8.9–10.3)
CO2: 21 mmol/L — ABNORMAL LOW (ref 22–32)
CREATININE: 0.57 mg/dL (ref 0.44–1.00)
Chloride: 109 mmol/L (ref 101–111)
Glucose, Bld: 108 mg/dL — ABNORMAL HIGH (ref 65–99)
Potassium: 3.2 mmol/L — ABNORMAL LOW (ref 3.5–5.1)
SODIUM: 139 mmol/L (ref 135–145)
TOTAL PROTEIN: 7 g/dL (ref 6.5–8.1)

## 2016-09-02 LAB — CBC
HCT: 30.9 % — ABNORMAL LOW (ref 36.0–46.0)
HEMOGLOBIN: 9.6 g/dL — AB (ref 12.0–15.0)
MCH: 23.1 pg — AB (ref 26.0–34.0)
MCHC: 31.1 g/dL (ref 30.0–36.0)
MCV: 74.5 fL — ABNORMAL LOW (ref 78.0–100.0)
PLATELETS: 251 10*3/uL (ref 150–400)
RBC: 4.15 MIL/uL (ref 3.87–5.11)
RDW: 17.2 % — ABNORMAL HIGH (ref 11.5–15.5)
WBC: 10.6 10*3/uL — AB (ref 4.0–10.5)

## 2016-09-02 MED ORDER — LABETALOL HCL 5 MG/ML IV SOLN
20.0000 mg | Freq: Once | INTRAVENOUS | Status: AC
  Administered 2016-09-02: 20 mg via INTRAVENOUS
  Filled 2016-09-02: qty 4

## 2016-09-02 MED ORDER — LABETALOL HCL 200 MG PO TABS
200.0000 mg | ORAL_TABLET | Freq: Two times a day (BID) | ORAL | Status: DC
  Administered 2016-09-02 – 2016-09-03 (×2): 200 mg via ORAL
  Filled 2016-09-02 (×2): qty 1

## 2016-09-02 MED ORDER — OXYCODONE-ACETAMINOPHEN 5-325 MG PO TABS
1.0000 | ORAL_TABLET | Freq: Once | ORAL | Status: AC
  Administered 2016-09-02: 1 via ORAL
  Filled 2016-09-02: qty 1

## 2016-09-02 NOTE — Progress Notes (Signed)
Magnesium sulfate d/c'd per Dr. Emelda FearFerguson. IV saline locked. Carmelina DaneERRI L Rina Adney, RN

## 2016-09-02 NOTE — Progress Notes (Signed)
Post Partum Day 11 Subjective:  Monique Gamble is a 26 y.o. [id number]  S/p NSVD admitted for postpartum preeclampsia.  Reports mild HA. Denies RUQ pain, swelling, scotomata Objective: Blood pressure (!) 149/90, pulse 73, temperature 97.6 F (36.4 C), temperature source Oral, resp. rate 18, height 5\' 6"  (1.676 m), weight 170 lb (77.1 kg), SpO2 100 %, unknown if currently breastfeeding.  Physical Exam:  General: alert, cooperative and no distress Lochia:normal flow Chest: normal WOB Heart: Regular rate Abdomen: +BS, soft, mild TTP (appropriate) Uterine Fundus: firm DVT Evaluation: No evidence of DVT seen on physical exam. Extremities: No edema   Recent Labs  09/01/16 1634 09/02/16 0535  HGB 9.5* 9.6*  HCT 31.8* 30.9*    Assessment/Plan:  ASSESSMENT: Monique Gamble is a [age] y.o. [id number] s/p NSVD  #Postpartum Preeclampsia - continue magnesium infusion - Treat HA prn with tylenol  Breastfeeding support PRN  LOS: 0 days   Federico FlakeKimberly Niles Keithan Dileonardo 09/02/2016, 4:11 PM

## 2016-09-03 ENCOUNTER — Encounter (HOSPITAL_COMMUNITY): Payer: Self-pay | Admitting: *Deleted

## 2016-09-03 MED ORDER — ENALAPRIL MALEATE 10 MG PO TABS: 10.0000 mg | ORAL_TABLET | Freq: Every day | ORAL | 1 refills | Status: DC

## 2016-09-03 MED ORDER — LABETALOL HCL 200 MG PO TABS: 200.0000 mg | ORAL_TABLET | Freq: Two times a day (BID) | ORAL | 1 refills | Status: DC

## 2016-09-03 MED ORDER — LABETALOL HCL 200 MG PO TABS
200.0000 mg | ORAL_TABLET | Freq: Once | ORAL | Status: AC
  Administered 2016-09-03: 200 mg via ORAL
  Filled 2016-09-03: qty 1

## 2016-09-03 NOTE — Progress Notes (Signed)
   09/03/16 [id number]  Provider Notification  Reason for Communication Other (Comment) (elevated BP, close to baseline)  Provider Name ferguson  Provider Role Attending physician  Method of Communication Face to face  Response No new orders (previously written scheduled meds given, md aware)  Notification Time [phone]

## 2016-09-03 NOTE — Discharge Instructions (Signed)
Postpartum Hypertension °Postpartum hypertension is high blood pressure after pregnancy that remains higher than normal for more than two days after delivery. You may not realize that you have postpartum hypertension if your blood pressure is not being checked regularly. In some cases, postpartum hypertension will go away on its own, usually within a week of delivery. However, for some women, medical treatment is required to prevent serious complications, such as seizures or stroke. °The following things can affect your blood pressure: °· The type of delivery you had. °· Having received IV fluids or other medicines during or after delivery. ° °What are the causes? °Postpartum hypertension may be caused by any of the following or by a combination of any of the following: °· Hypertension that existed before pregnancy (chronic hypertension). °· Gestational hypertension. °· Preeclampsia or eclampsia. °· Receiving a lot of fluid through an IV during or after delivery. °· Medicines. °· HELLP syndrome. °· Hyperthyroidism. °· Stroke. °· Other rare neurological or blood disorders. ° °In some cases, the cause may not be known. °What increases the risk? °Postpartum hypertension can be related to one or more risk factors, such as: °· Chronic hypertension. In some cases, this may not have been diagnosed before pregnancy. °· Obesity. °· Type 2 diabetes. °· Kidney disease. °· Family history of preeclampsia. °· Other medical conditions that cause hormonal imbalances. ° °What are the signs or symptoms? °As with all types of hypertension, postpartum hypertension may not have any symptoms. Depending on how high your blood pressure is, you may experience: °· Headaches. These may be mild, moderate, or severe. They may also be steady, constant, or sudden in onset (thunderclap headache). °· Visual changes. °· Dizziness. °· Shortness of breath. °· Swelling of your hands, feet, lower legs, or face. In some cases, you may have swelling in  more than one of these locations. °· Heart palpitations or a racing heartbeat. °· Difficulty breathing while lying down. °· Decreased urination. ° °Other rare signs and symptoms may include: °· Sweating more than usual. This lasts longer than a few days after delivery. °· Chest pain. °· Sudden dizziness when you get up from sitting or lying down. °· Seizures. °· Nausea or vomiting. °· Abdominal pain. ° °How is this diagnosed? °The diagnosis of postpartum hypertension is made through a combination of physical examination findings and testing of your blood and urine. You may also have additional tests, such as a CT scan or an MRI, to check for other complications of postpartum hypertension. °How is this treated? °When blood pressure is high enough to require treatment, your options may include: °· Medicines to reduce blood pressure (antihypertensives). Tell your health care provider if you are breastfeeding or if you plan to breastfeed. There are many antihypertensive medicines that are safe to take while breastfeeding. °· Stopping medicines that may be causing hypertension. °· Treating medical conditions that are causing hypertension. °· Treating the complications of hypertension, such as seizures, stroke, or kidney problems. ° °Your health care provider will also continue to monitor your blood pressure closely and repeatedly until it is within a safe range for you. °Follow these instructions at home: °· Take medicines only as directed by your health care provider. °· Get regular exercise after your health care provider tells you that it is safe. °· Follow your health care provider’s recommendations on fluid and salt restrictions. °· Do not use any tobacco products, including cigarettes, chewing tobacco, or electronic cigarettes. If you need help quitting, ask your health care provider. °·   Keep all follow-up visits as directed by your health care provider. This is important. °Contact a health care provider  if: °· Your symptoms get worse. °· You have new symptoms, such as: °? Headache. °? Dizziness. °? Visual changes. °Get help right away if: °· You develop a severe or sudden headache. °· You have seizures. °· You develop numbness or weakness on one side of your body. °· You have difficulty thinking, speaking, or swallowing. °· You develop severe abdominal pain. °· You develop difficulty breathing, chest pain, a racing heartbeat, or heart palpitations. °These symptoms may represent a serious problem that is an emergency. Do not wait to see if the symptoms will go away. Get medical help right away. Call your local emergency services (911 in the [place] Do not drive yourself to the hospital. °This information is not intended to replace advice given to you by your health care provider. Make sure you discuss any questions you have with your health care provider. °Document Released: 10/06/2013 Document Revised: 07/08/2015 Document Reviewed: 08/17/2013 °Elsevier Interactive Patient Education © 2018 [employer] ° °

## 2016-09-03 NOTE — Plan of Care (Signed)
Problem: Health Behavior/Discharge Planning: Goal: Ability to manage health-related needs will improve Outcome: Progressing Pt has support for herself (SO), and her child (godmother). Pt is asking about when she will get to go home. No homecare needs mentioned by pt.   Problem: Pain Managment: Goal: General experience of comfort will improve Outcome: Completed/Met Date Met: 09/03/16 Pt denies any pain at this time.   Problem: Tissue Perfusion: Goal: Risk factors for ineffective tissue perfusion will decrease Outcome: Completed/Met Date Met: 09/03/16 No symptoms of ineffective tissue perfusion. Pt is ambulatory. Pt has been sleeping majority of this RNs shift, so I have not witnessed her ambulating.   Problem: Fluid Volume: Goal: Ability to maintain a balanced intake and output will improve Outcome: Completed/Met Date Met: 09/03/16 Pt declines any issues with voiding. Marry Guan

## 2016-09-03 NOTE — Discharge Summary (Signed)
Physician Discharge Summary  Patient ID: Monique Gamble MRN: [id number] DOB/AGE: [birth year] 26 y.o.  Admit date: 09/01/2016 Discharge date: 09/03/2016  Admission Diagnoses: Postpartum preeclampsia  Discharge Diagnoses:  Active Problems:   Preeclampsia in postpartum period   Discharged Condition: good  Hospital Course: Pt was admitted with postpartum preeclampsia. Received magnesium x 24 hrs. Started on antihypertensive medications. BP stabilized to 140-150's/80-90's. No HA's or abd pain. Ambulating, voiding and tolerating diet without problems. Felt amendable for discharge home.   Consults: None  Significant Diagnostic Studies: labs:   Treatments: IV hydration and magnesium  Discharge Exam: Blood pressure (!) 143/90, pulse 75, temperature 99 F (37.2 C), temperature source Oral, resp. rate 18, height 5\' 6"  (1.676 m), weight 77.1 kg (170 lb), SpO2 100 %, unknown if currently breastfeeding.  Lungs clear Heart RRR Abd soft + BS uterus firm Ext trace edema, nl DTR's, no clonus  Disposition: 01-Home or Self Care  Discharge Instructions    Activity as tolerated    Complete by:  As directed    Call MD for:  difficulty breathing, headache or visual disturbances    Complete by:  As directed    Call MD for:  extreme fatigue    Complete by:  As directed    Call MD for:  persistant dizziness or light-headedness    Complete by:  As directed    Call MD for:  persistant nausea and vomiting    Complete by:  As directed    Call MD for:  severe uncontrolled pain    Complete by:  As directed    Call MD for:  temperature >100.4    Complete by:  As directed    Diet - low sodium heart healthy    Complete by:  As directed    Sexual acrtivity    Complete by:  As directed    Pelvic rest until Postpartum visit     Allergies as of 09/03/2016   No Known Allergies     Medication List    TAKE these medications   enalapril 10 MG tablet Commonly known as:  VASOTEC Take 1 tablet (10  mg total) by mouth daily.   ibuprofen 600 MG tablet Commonly known as:  ADVIL,MOTRIN Take 1 tablet (600 mg total) by mouth every 6 (six) hours.   labetalol 200 MG tablet Commonly known as:  NORMODYNE Take 1 tablet (200 mg total) by mouth 2 (two) times daily.      Follow-up Information    [facility] Schedule an appointment as soon as possible for a visit in 1 week(s).   Why:  For BP check Contact information: [address] [place] [id number] [phone]          Signed: Hermina StaggersMichael L Samiksha Pellicano 09/03/2016, 3:51 PM

## 2016-09-18 ENCOUNTER — Inpatient Hospital Stay (HOSPITAL_COMMUNITY)
Admission: AD | Admit: 2016-09-18 | Discharge: 2016-09-20 | DRG: 776 | Disposition: A | Discharge status: Home / Self Care | Payer: Medicaid Other | Source: Ambulatory Visit | Attending: Obstetrics & Gynecology | Admitting: Obstetrics & Gynecology

## 2016-09-18 ENCOUNTER — Encounter (HOSPITAL_COMMUNITY): Payer: Self-pay

## 2016-09-18 ENCOUNTER — Inpatient Hospital Stay (HOSPITAL_COMMUNITY): Payer: Medicaid Other

## 2016-09-18 DIAGNOSIS — O09299 Supervision of pregnancy with other poor reproductive or obstetric history, unspecified trimester: Secondary | ICD-10-CM | POA: Diagnosis present

## 2016-09-18 DIAGNOSIS — N7093 Salpingitis and oophoritis, unspecified: Secondary | ICD-10-CM | POA: Diagnosis present

## 2016-09-18 DIAGNOSIS — R51 Headache: Secondary | ICD-10-CM | POA: Diagnosis not present

## 2016-09-18 DIAGNOSIS — O1415 Severe pre-eclampsia, complicating the puerperium: Principal | ICD-10-CM | POA: Diagnosis present

## 2016-09-18 DIAGNOSIS — O1495 Unspecified pre-eclampsia, complicating the puerperium: Secondary | ICD-10-CM | POA: Diagnosis not present

## 2016-09-18 DIAGNOSIS — Z87891 Personal history of nicotine dependence: Secondary | ICD-10-CM

## 2016-09-18 LAB — COMPREHENSIVE METABOLIC PANEL
ALBUMIN: 3.6 g/dL (ref 3.5–5.0)
ALK PHOS: 102 U/L (ref 38–126)
ALT: 22 U/L (ref 14–54)
AST: 38 U/L (ref 15–41)
Anion gap: 8 (ref 5–15)
BILIRUBIN TOTAL: 1.2 mg/dL (ref 0.3–1.2)
BUN: 6 mg/dL (ref 6–20)
CALCIUM: 9.2 mg/dL (ref 8.9–10.3)
CO2: 23 mmol/L (ref 22–32)
Chloride: 107 mmol/L (ref 101–111)
Creatinine, Ser: 0.73 mg/dL (ref 0.44–1.00)
GFR calc Af Amer: >60 mL/min (ref >60)
GFR calc non Af Amer: >60 mL/min (ref >60)
GLUCOSE: 81 mg/dL (ref 65–99)
Potassium: 3.7 mmol/L (ref 3.5–5.1)
SODIUM: 138 mmol/L (ref 135–145)
TOTAL PROTEIN: 7.8 g/dL (ref 6.5–8.1)

## 2016-09-18 LAB — PROTEIN / CREATININE RATIO, URINE
CREATININE, URINE: 109 mg/dL
PROTEIN CREATININE RATIO: 0.48 mg/mg{creat} — AB (ref 0.00–0.15)
TOTAL PROTEIN, URINE: 52 mg/dL

## 2016-09-18 LAB — URINALYSIS, ROUTINE W REFLEX MICROSCOPIC
Bilirubin Urine: NEGATIVE
GLUCOSE, UA: NEGATIVE mg/dL
Ketones, ur: NEGATIVE mg/dL
Leukocytes, UA: NEGATIVE
Nitrite: NEGATIVE
Protein, ur: 30 mg/dL — AB
Specific Gravity, Urine: 1.01 (ref 1.005–1.030)
pH: 7 (ref 5.0–8.0)

## 2016-09-18 LAB — RAPID URINE DRUG SCREEN, HOSP PERFORMED
AMPHETAMINES: NOT DETECTED
Barbiturates: NOT DETECTED
Benzodiazepines: NOT DETECTED
Cocaine: NOT DETECTED
OPIATES: NOT DETECTED
Tetrahydrocannabinol: POSITIVE — AB

## 2016-09-18 LAB — CBC
HCT: 34.2 % — ABNORMAL LOW (ref 36.0–46.0)
Hemoglobin: 10.1 g/dL — ABNORMAL LOW (ref 12.0–15.0)
MCH: 22.1 pg — AB (ref 26.0–34.0)
MCHC: 29.5 g/dL — AB (ref 30.0–36.0)
MCV: 75 fL — ABNORMAL LOW (ref 78.0–100.0)
Platelets: 311 10*3/uL (ref 150–400)
RBC: 4.56 MIL/uL (ref 3.87–5.11)
RDW: 18.4 % — AB (ref 11.5–15.5)
WBC: 7.7 10*3/uL (ref 4.0–10.5)

## 2016-09-18 LAB — TYPE AND SCREEN
ABO/RH(D): O POS
Antibody Screen: NEGATIVE

## 2016-09-18 MED ORDER — ZOLPIDEM TARTRATE 5 MG PO TABS: 5.0000 mg | ORAL_TABLET | Freq: Every evening | ORAL | Status: DC | PRN

## 2016-09-18 MED ORDER — LABETALOL HCL 5 MG/ML IV SOLN
INTRAVENOUS | Status: AC
  Filled 2016-09-18: qty 4

## 2016-09-18 MED ORDER — LACTATED RINGERS IV SOLN
INTRAVENOUS | Status: DC
  Administered 2016-09-18 – 2016-09-19 (×3): via INTRAVENOUS

## 2016-09-18 MED ORDER — CALCIUM CARBONATE ANTACID 500 MG PO CHEW: 2.0000 | CHEWABLE_TABLET | ORAL | Status: DC | PRN

## 2016-09-18 MED ORDER — ACETAMINOPHEN 500 MG PO TABS
1000.0000 mg | ORAL_TABLET | Freq: Once | ORAL | Status: AC
  Administered 2016-09-18: 1000 mg via ORAL
  Filled 2016-09-18: qty 2

## 2016-09-18 MED ORDER — LABETALOL HCL 100 MG PO TABS
200.0000 mg | ORAL_TABLET | Freq: Two times a day (BID) | ORAL | Status: DC
  Administered 2016-09-19: 200 mg via ORAL
  Filled 2016-09-18: qty 2

## 2016-09-18 MED ORDER — MAGNESIUM SULFATE BOLUS VIA INFUSION
4.0000 g | Freq: Once | INTRAVENOUS | Status: AC
  Administered 2016-09-18: 4 g via INTRAVENOUS
  Filled 2016-09-18: qty 500

## 2016-09-18 MED ORDER — HYDRALAZINE HCL 20 MG/ML IJ SOLN
10.0000 mg | Freq: Once | INTRAMUSCULAR | Status: DC | PRN
  Filled 2016-09-18: qty 1

## 2016-09-18 MED ORDER — HYDRALAZINE HCL 20 MG/ML IJ SOLN
10.0000 mg | Freq: Once | INTRAMUSCULAR | Status: AC
  Administered 2016-09-18: 10 mg via INTRAVENOUS

## 2016-09-18 MED ORDER — MAGNESIUM SULFATE 40 G IN LACTATED RINGERS - SIMPLE
2.0000 g/h | INTRAVENOUS | Status: DC
  Administered 2016-09-18 – 2016-09-19 (×2): 2 g/h via INTRAVENOUS
  Filled 2016-09-18 (×2): qty 40

## 2016-09-18 MED ORDER — KETOROLAC TROMETHAMINE 60 MG/2ML IM SOLN
60.0000 mg | Freq: Once | INTRAMUSCULAR | Status: AC
  Administered 2016-09-18: 60 mg via INTRAMUSCULAR
  Filled 2016-09-18: qty 2

## 2016-09-18 MED ORDER — DOCUSATE SODIUM 100 MG PO CAPS
100.0000 mg | ORAL_CAPSULE | Freq: Every day | ORAL | Status: DC
  Filled 2016-09-18: qty 1

## 2016-09-18 MED ORDER — ENALAPRIL MALEATE 10 MG PO TABS
10.0000 mg | ORAL_TABLET | Freq: Every day | ORAL | Status: DC
  Administered 2016-09-18 – 2016-09-19 (×2): 10 mg via ORAL
  Filled 2016-09-18 (×3): qty 1

## 2016-09-18 MED ORDER — ACETAMINOPHEN 325 MG PO TABS
650.0000 mg | ORAL_TABLET | ORAL | Status: DC | PRN
  Administered 2016-09-19: 650 mg via ORAL
  Filled 2016-09-18: qty 2

## 2016-09-18 MED ORDER — PRENATAL MULTIVITAMIN CH: 1.0000 | ORAL_TABLET | Freq: Every day | ORAL | Status: DC

## 2016-09-18 MED ORDER — LABETALOL HCL 5 MG/ML IV SOLN
20.0000 mg | INTRAVENOUS | Status: AC | PRN
  Administered 2016-09-18: 20 mg via INTRAVENOUS
  Administered 2016-09-18: 80 mg via INTRAVENOUS
  Administered 2016-09-18: 40 mg via INTRAVENOUS
  Filled 2016-09-18: qty 8
  Filled 2016-09-18: qty 16

## 2016-09-18 NOTE — MAU Provider Note (Signed)
History     CSN: [id number]  Arrival date and time: 09/18/16 1652   First Provider Initiated Contact with Patient 09/18/16 1718      Chief Complaint  Patient presents with  . Headache  . Hypertension   Monique Gamble is a 26 y.o. [id number] who is S/P NSVD on 08/22/16. She did not have magnesium during labor. However, she was readmitted on 09/01/16, and she had magnesium during that admission. She was DC home on Vasotec 10mg  QD and Labetalol 200mg  BID. She states that she has been taking her medication as prescribed. She states for the last 2 days she has had a headache, and her sister took her blood pressure at home. She states that at home it was 140/90 (something). She has taken tylenol for headache yesterday, but it did not help. So, she has not taken any today.  Bottlefeeding, no breast pain.    Headache   This is a new problem. The current episode started yesterday. The problem occurs intermittently. The problem has been waxing and waning. The pain is located in the frontal region. The pain does not radiate. The pain is at a severity of 10/10. Pertinent negatives include no abdominal pain (denies RUQ pain ). She has tried acetaminophen for the symptoms. The treatment provided no relief. Her past medical history is significant for hypertension.  Hypertension  This is a new problem. The current episode started 1 to 4 weeks ago. The problem is unchanged. The problem is uncontrolled. Associated symptoms include headaches. Associated agents: pregnancy  Past treatments include ACE inhibitors and beta blockers. The current treatment provides no improvement. There are no compliance problems.     Past Medical History:  Diagnosis Date  . ADHD (attention deficit hyperactivity disorder)   . Chlamydia   . Headache   . History of anemia   . Hypertension     Past Surgical History:  Procedure Laterality Date  . INDUCED ABORTION      Family History  Problem Relation Age of Onset  .  Hypertension Mother   . Diabetes Sister   . Anesthesia problems Neg Hx     Social History  Substance Use Topics  . Smoking status: Former Smoker    Types: Cigars  . Smokeless tobacco: Never Used  . Alcohol use No    Allergies: No Known Allergies  Prescriptions Prior to Admission  Medication Sig Dispense Refill Last Dose  . enalapril (VASOTEC) 10 MG tablet Take 1 tablet (10 mg total) by mouth daily. 30 tablet 1   . ibuprofen (ADVIL,MOTRIN) 600 MG tablet Take 1 tablet (600 mg total) by mouth every 6 (six) hours. 30 tablet 0 09/01/2016 at 0100  . labetalol (NORMODYNE) 200 MG tablet Take 1 tablet (200 mg total) by mouth 2 (two) times daily. 30 tablet 1     Review of Systems  Eyes: Positive for visual disturbance ("blurry vision and black spots").  Gastrointestinal: Negative for abdominal pain (denies RUQ pain ).  Genitourinary: Negative for vaginal bleeding.  Neurological: Positive for headaches.   Physical Exam   Blood pressure (!) 176/109, pulse 67, temperature 98.4 F (36.9 C), temperature source Oral, resp. rate 18, unknown if currently breastfeeding.  Physical Exam  Nursing note and vitals reviewed. Constitutional: She is oriented to person, place, and time. She appears well-developed and well-nourished. No distress.  HENT:  Head: Normocephalic.  Cardiovascular: Normal rate.   Respiratory: Effort normal.  GI: Soft. There is no tenderness. There is no rebound.  Neurological:  She is alert and oriented to person, place, and time.  Skin: Skin is warm and dry.  Psychiatric: She has a normal mood and affect.   Results for orders placed or performed during the hospital encounter of 09/18/16 (from the past 24 hour(s))  Urinalysis, Routine w reflex microscopic     Status: Abnormal   Collection Time: 09/18/16  4:55 PM  Result Value Ref Range   Color, Urine YELLOW YELLOW   APPearance CLEAR CLEAR   Specific Gravity, Urine 1.010 1.005 - 1.030   pH 7.0 5.0 - 8.0   Glucose, UA  NEGATIVE NEGATIVE mg/dL   Hgb urine dipstick SMALL (A) NEGATIVE   Bilirubin Urine NEGATIVE NEGATIVE   Ketones, ur NEGATIVE NEGATIVE mg/dL   Protein, ur 30 (A) NEGATIVE mg/dL   Nitrite NEGATIVE NEGATIVE   Leukocytes, UA NEGATIVE NEGATIVE   RBC / HPF 0-5 0 - 5 RBC/hpf   WBC, UA 6-30 0 - 5 WBC/hpf   Bacteria, UA RARE (A) NONE SEEN   Squamous Epithelial / LPF 0-5 (A) NONE SEEN   Mucous PRESENT   Protein / creatinine ratio, urine     Status: Abnormal   Collection Time: 09/18/16  4:55 PM  Result Value Ref Range   Creatinine, Urine 109.00 mg/dL   Total Protein, Urine 52 mg/dL   Protein Creatinine Ratio 0.48 (H) 0.00 - 0.15 mg/mg[Cre]  Urine rapid drug screen (hosp performed)not at [facility]     Status: Abnormal   Collection Time: 09/18/16  4:55 PM  Result Value Ref Range   Opiates NONE DETECTED NONE DETECTED   Cocaine NONE DETECTED NONE DETECTED   Benzodiazepines NONE DETECTED NONE DETECTED   Amphetamines NONE DETECTED NONE DETECTED   Tetrahydrocannabinol POSITIVE (A) NONE DETECTED   Barbiturates NONE DETECTED NONE DETECTED  Comprehensive metabolic panel     Status: None   Collection Time: 09/18/16  5:30 PM  Result Value Ref Range   Sodium 138 135 - 145 mmol/L   Potassium 3.7 3.5 - 5.1 mmol/L   Chloride 107 101 - 111 mmol/L   CO2 23 22 - 32 mmol/L   Glucose, Bld 81 65 - 99 mg/dL   BUN 6 6 - 20 mg/dL   Creatinine, Ser 7.820.73 0.44 - 1.00 mg/dL   Calcium 9.2 8.9 - 95.610.3 mg/dL   Total Protein 7.8 6.5 - 8.1 g/dL   Albumin 3.6 3.5 - 5.0 g/dL   AST 38 15 - 41 U/L   ALT 22 14 - 54 U/L   Alkaline Phosphatase 102 38 - 126 U/L   Total Bilirubin 1.2 0.3 - 1.2 mg/dL   GFR calc non Af Amer >60 >60 mL/min   GFR calc Af Amer >60 >60 mL/min   Anion gap 8 5 - 15  CBC     Status: Abnormal   Collection Time: 09/18/16  5:30 PM  Result Value Ref Range   WBC 7.7 4.0 - 10.5 K/uL   RBC 4.56 3.87 - 5.11 MIL/uL   Hemoglobin 10.1 (L) 12.0 - 15.0 g/dL   HCT 21.334.2 (L) 08.636.0 - 57.846.0 %   MCV 75.0 (L) 78.0 -  100.0 fL   MCH 22.1 (L) 26.0 - 34.0 pg   MCHC 29.5 (L) 30.0 - 36.0 g/dL   RDW 46.918.4 (H) 62.911.5 - 52.815.5 %   Platelets 311 150 - 400 K/uL    MAU Course  Procedures  MDM 1807: D/W Dr. Macon LargeAnyanwu, will get head CT and readmit for magnesium again. Give dose of apresoline for  blood pressure and toradol for headache.    Assessment and Plan   1. Preeclampsia in postpartum period    Admit to 3rd floor Magnesium Head CT pending Blood pressure management     Thressa ShellerHeather Hogan 09/18/2016, 5:20 PM

## 2016-09-18 NOTE — Progress Notes (Signed)
Patient returned to room and settled comfortable in be. No concern voiced at this time.

## 2016-09-18 NOTE — MAU Note (Signed)
Had vaginal delivery on 7/7 was induced for PIH, has a headache x 2 days, BP elevated at home, has been re-hospitalized one time since giving birth for same.

## 2016-09-18 NOTE — Progress Notes (Signed)
Patient transported to Radiology dept for CT of the head.

## 2016-09-19 DIAGNOSIS — N7093 Salpingitis and oophoritis, unspecified: Secondary | ICD-10-CM | POA: Diagnosis present

## 2016-09-19 MED ORDER — AMLODIPINE BESYLATE 10 MG PO TABS
10.0000 mg | ORAL_TABLET | Freq: Every day | ORAL | Status: DC
  Administered 2016-09-19: 10 mg via ORAL
  Filled 2016-09-19: qty 1

## 2016-09-19 MED ORDER — OXYCODONE HCL 5 MG PO TABS
5.0000 mg | ORAL_TABLET | ORAL | Status: DC | PRN
  Administered 2016-09-20: 5 mg via ORAL
  Filled 2016-09-19: qty 1

## 2016-09-19 MED ORDER — OXYCODONE HCL 5 MG PO TABS
10.0000 mg | ORAL_TABLET | ORAL | Status: DC | PRN
  Administered 2016-09-19: 10 mg via ORAL
  Filled 2016-09-19: qty 2

## 2016-09-19 MED ORDER — OXYCODONE-ACETAMINOPHEN 5-325 MG PO TABS: 1.0000 | ORAL_TABLET | Freq: Four times a day (QID) | ORAL | Status: DC | PRN

## 2016-09-19 NOTE — Progress Notes (Signed)
Faculty Practice OB/GYN Attending Note   Subjective:  Still reports having headache, not alleviated by Tylenol. No visual symptoms.  No abdominal pain.  Admitted on 09/18/2016 for Preeclampsia in postpartum period.    Objective:  Blood pressure 107/62, pulse 80, temperature 98.3 F (36.8 C), temperature source Oral, resp. rate 16, height _0  (1.676 m), SpO2 99 %, unknown if currently breastfeeding.  Patient Vitals for the past 24 hrs:  BP Temp Temp src Pulse Resp SpO2 Height  09/19/16 0542 107/62 98.3 F (36.8 C) Oral 80 16 99 % -  09/19/16 0330 (!) 148/90 98.5 F (36.9 C) Oral 76 18 100 % -  09/19/16 0200 128/83 - - 87 16 100 % -  09/19/16 0103 137/83 - - 84 17 - -  09/19/16 0008 (!) 141/92 98.5 F (36.9 C) Oral 84 18 100 % -  09/18/16 2100 (!) 153/97 - - 80 18 100 % -  09/18/16 2012 (!) 149/93 - - 73 18 100 % -  09/18/16 1911 133/72 98.4 F (36.9 C) Oral 81 16 99 % _1  (1.676 m)  09/18/16 1847 (!) 145/87 99.1 F (37.3 C) Oral 73 14 100 % -  09/18/16 1817 (!) 164/90 - - 63 - - -  09/18/16 1802 (!) 166/86 - - (!) 57 - - -  09/18/16 1747 (!) 178/99 - - 67 - - -  09/18/16 1744 (!) 171/93 - - 61 - - -  09/18/16 1732 (!) 180/100 - - 62 - - -  09/18/16 1720 (!) 177/97 - - 62 - - -  09/18/16 1715 (!) 176/109 - - 67 - - -  09/18/16 1703 (!) 187/95 98.4 F (36.9 C) Oral 61 18 - -   Gen: NAD HENT: Normocephalic, atraumatic Lungs: Normal respiratory effort Heart: Regular rate noted Abdomen: NT gravid fundus, soft Cervix: Deferred Ext: 2+ DTRs, no edema, no cyanosis, negative Homan's sign  Results for orders placed or performed during the hospital encounter of 09/18/16 (from the past 48 hour(s))  Urinalysis, Routine w reflex microscopic     Status: Abnormal   Collection Time: 09/18/16  4:55 PM  Result Value Ref Range   Color, Urine YELLOW YELLOW   APPearance CLEAR CLEAR   Specific Gravity, Urine 1.010 1.005 - 1.030   pH 7.0 5.0 - 8.0   Glucose, UA NEGATIVE NEGATIVE mg/dL   Hgb urine dipstick SMALL (A) NEGATIVE   Bilirubin Urine NEGATIVE NEGATIVE   Ketones, ur NEGATIVE NEGATIVE mg/dL   Protein, ur 30 (A) NEGATIVE mg/dL   Nitrite NEGATIVE NEGATIVE   Leukocytes, UA NEGATIVE NEGATIVE   RBC / HPF 0-5 0 - 5 RBC/hpf   WBC, UA 6-30 0 - 5 WBC/hpf   Bacteria, UA RARE (A) NONE SEEN   Squamous Epithelial / LPF 0-5 (A) NONE SEEN   Mucous PRESENT   Protein / creatinine ratio, urine     Status: Abnormal   Collection Time: 09/18/16  4:55 PM  Result Value Ref Range   Creatinine, Urine 109.00 mg/dL   Total Protein, Urine 52 mg/dL    Comment: NO NORMAL RANGE ESTABLISHED FOR THIS TEST   Protein Creatinine Ratio 0.48 (H) 0.00 - 0.15 mg/mg[Cre]  Urine rapid drug screen (hosp performed)not at [facility]     Status: Abnormal   Collection Time: 09/18/16  4:55 PM  Result Value Ref Range   Opiates NONE DETECTED NONE DETECTED   Cocaine NONE DETECTED NONE DETECTED   Benzodiazepines NONE DETECTED NONE DETECTED   Amphetamines NONE  DETECTED NONE DETECTED   Tetrahydrocannabinol POSITIVE (A) NONE DETECTED   Barbiturates NONE DETECTED NONE DETECTED    Comment:        DRUG SCREEN FOR MEDICAL PURPOSES ONLY.  IF CONFIRMATION IS NEEDED FOR ANY PURPOSE, NOTIFY LAB WITHIN 5 DAYS.        LOWEST DETECTABLE LIMITS FOR URINE DRUG SCREEN Drug Class       Cutoff (ng/mL) Amphetamine      1000 Barbiturate      200 Benzodiazepine   952 Tricyclics       841 Opiates          300 Cocaine          300 THC              50   Comprehensive metabolic panel     Status: None   Collection Time: 09/18/16  5:30 PM  Result Value Ref Range   Sodium 138 135 - 145 mmol/L   Potassium 3.7 3.5 - 5.1 mmol/L   Chloride 107 101 - 111 mmol/L   CO2 23 22 - 32 mmol/L   Glucose, Bld 81 65 - 99 mg/dL   BUN 6 6 - 20 mg/dL   Creatinine, Ser 0.73 0.44 - 1.00 mg/dL   Calcium 9.2 8.9 - 10.3 mg/dL   Total Protein 7.8 6.5 - 8.1 g/dL   Albumin 3.6 3.5 - 5.0 g/dL   AST 38 15 - 41 U/L   ALT 22 14 - 54 U/L   Alkaline  Phosphatase 102 38 - 126 U/L   Total Bilirubin 1.2 0.3 - 1.2 mg/dL   GFR calc non Af Amer >60 >60 mL/min   GFR calc Af Amer >60 >60 mL/min    Comment: (NOTE) The eGFR has been calculated using the CKD EPI equation. This calculation has not been validated in all clinical situations. eGFR's persistently <60 mL/min signify possible Chronic Kidney Disease.    Anion gap 8 5 - 15  CBC     Status: Abnormal   Collection Time: 09/18/16  5:30 PM  Result Value Ref Range   WBC 7.7 4.0 - 10.5 K/uL   RBC 4.56 3.87 - 5.11 MIL/uL   Hemoglobin 10.1 (L) 12.0 - 15.0 g/dL   HCT 34.2 (L) 36.0 - 46.0 %   MCV 75.0 (L) 78.0 - 100.0 fL   MCH 22.1 (L) 26.0 - 34.0 pg   MCHC 29.5 (L) 30.0 - 36.0 g/dL   RDW 18.4 (H) 11.5 - 15.5 %   Platelets 311 150 - 400 K/uL  Type and screen [place]     Status: None   Collection Time: 09/18/16  6:26 PM  Result Value Ref Range   ABO/RH(D) O POS    Antibody Screen NEG    Sample Expiration 09/21/2016     Ct Head Wo Contrast  Result Date: 09/18/2016 CLINICAL DATA:  26 y.o. [id number] who is S/P NSVD on 08/22/16. Two days of headache and elevated blood pressure. Severe frontal headache. EXAM: CT HEAD WITHOUT CONTRAST TECHNIQUE: Contiguous axial images were obtained from the base of the skull through the vertex without intravenous contrast. COMPARISON:  Head CT dated 07/25/2013 FINDINGS: Brain: Ventricles are normal in size and configuration. All areas of the brain demonstrate normal gray-white matter attenuation. There is no mass, hemorrhage, edema or other evidence of acute parenchymal abnormality. No extra-axial hemorrhage. Vascular: No hyperdense vessel or unexpected calcification. Skull: Normal. Negative for fracture or focal lesion. Sinuses/Orbits: No acute finding. Other:  None. IMPRESSION: Normal head CT.  No intracranial mass, hemorrhage or edema. Electronically Signed   By: Franki Cabot M.D.   On: 09/18/2016 20:20    Assessment & Plan:  26 y.o. [id number]  re-admitted for postpartum preeclampsia s/p SVD on 08/22/16 and previous admission for postpartum preeclampsia on 09/01/16. - Continue magnesium sulfate until around 7 pm tonight - BP table on Enalapril, Labetalol switched to Norvasc. Will monitor - If stable, may discharge tomorrow morning - Routine postpartum care   Verita Schneiders, MD, [place] Attending [place], [facility]

## 2016-09-19 NOTE — Plan of Care (Signed)
Problem: Physical Regulation: Goal: Will remain free of preeclampsia complications CT of the head  Completed.

## 2016-09-19 NOTE — Plan of Care (Signed)
Problem: Pain Management: Goal: Relief or control of pain will improve Outcome: Progressing Toradol 60 mg IM and Tyl. 650 mg po administered.

## 2016-09-19 NOTE — H&P (Signed)
History   CSN: [id number]  Arrival date and time: 09/18/16 1652   First Provider Initiated Contact with Patient 09/18/16 1718         Chief Complaint  Patient presents with  . Headache  . Hypertension   [name] is a 26 y.o. [id number] who is S/P NSVD on 08/22/16. She did not have magnesium during labor. However, she was readmitted on 09/01/16, and she had magnesium during that admission. She was DC home on Vasotec 10mg  QD and Labetalol 200mg  BID. She states that she has been taking her medication as prescribed. She states for the last 2 days she has had a headache, and her sister took her blood pressure at home. She states that at home it was 140/90 (something). She has taken tylenol for headache yesterday, but it did not help. So, she has not taken any today.  Bottlefeeding, no breast pain.   Headache   This is a new problem. The current episode started yesterday. The problem occurs intermittently. The problem has been waxing and waning. The pain is located in the frontal region. The pain does not radiate. The pain is at a severity of 10/10. Pertinent negatives include no abdominal pain (denies RUQ pain ). She has tried acetaminophen for the symptoms. The treatment provided no relief. Her past medical history is significant for hypertension.  Hypertension  This is a new problem. The current episode started 1 to 4 weeks ago. The problem is unchanged. The problem is uncontrolled. Associated symptoms include headaches. Associated agents: pregnancy  Past treatments include ACE inhibitors and beta blockers. The current treatment provides no improvement. There are no compliance problems.         Past Medical History:  Diagnosis Date  . ADHD (attention deficit hyperactivity disorder)   . Chlamydia   . Headache   . History of anemia   . Hypertension          Past Surgical History:  Procedure Laterality Date  . INDUCED ABORTION           Family History  Problem  Relation Age of Onset  . Hypertension Mother   . Diabetes Sister   . Anesthesia problems Neg Hx          Social History  Substance Use Topics  . Smoking status: Former Smoker    Types: Cigars  . Smokeless tobacco: Never Used  . Alcohol use No    Allergies: No Known Allergies         Prescriptions Prior to Admission  Medication Sig Dispense Refill Last Dose  . enalapril (VASOTEC) 10 MG tablet Take 1 tablet (10 mg total) by mouth daily. 30 tablet 1   . ibuprofen (ADVIL,MOTRIN) 600 MG tablet Take 1 tablet (600 mg total) by mouth every 6 (six) hours. 30 tablet 0 09/01/2016 at 0100  . labetalol (NORMODYNE) 200 MG tablet Take 1 tablet (200 mg total) by mouth 2 (two) times daily. 30 tablet 1     Review of Systems  Eyes: Positive for visual disturbance ("blurry vision and black spots").  Gastrointestinal: Negative for abdominal pain (denies RUQ pain ).  Genitourinary: Negative for vaginal bleeding.  Neurological: Positive for headaches.   Physical Exam   Blood pressure (!) 176/109, pulse 67, temperature 98.4 F (36.9 C), temperature source Oral, resp. rate 18, unknown if currently breastfeeding.  Physical Exam  Nursing note and vitals reviewed. Constitutional: She is oriented to person, place, and time. She appears well-developed and well-nourished. No distress.  HENT:  Head: Normocephalic.  Cardiovascular: Normal rate.   Respiratory: Effort normal.  GI: Soft. There is no tenderness. There is no rebound.  Neurological: She is alert and oriented to person, place, and time.  Skin: Skin is warm and dry.  Psychiatric: She has a normal mood and affect.   Lab Results Last 24 Hours       Results for orders placed or performed during the hospital encounter of 09/18/16 (from the past 24 hour(s))  Urinalysis, Routine w reflex microscopic     Status: Abnormal   Collection Time: 09/18/16  4:55 PM  Result Value Ref Range   Color, Urine YELLOW YELLOW   APPearance  CLEAR CLEAR   Specific Gravity, Urine 1.010 1.005 - 1.030   pH 7.0 5.0 - 8.0   Glucose, UA NEGATIVE NEGATIVE mg/dL   Hgb urine dipstick SMALL (A) NEGATIVE   Bilirubin Urine NEGATIVE NEGATIVE   Ketones, ur NEGATIVE NEGATIVE mg/dL   Protein, ur 30 (A) NEGATIVE mg/dL   Nitrite NEGATIVE NEGATIVE   Leukocytes, UA NEGATIVE NEGATIVE   RBC / HPF 0-5 0 - 5 RBC/hpf   WBC, UA 6-30 0 - 5 WBC/hpf   Bacteria, UA RARE (A) NONE SEEN   Squamous Epithelial / LPF 0-5 (A) NONE SEEN   Mucous PRESENT   Protein / creatinine ratio, urine     Status: Abnormal   Collection Time: 09/18/16  4:55 PM  Result Value Ref Range   Creatinine, Urine 109.00 mg/dL   Total Protein, Urine 52 mg/dL   Protein Creatinine Ratio 0.48 (H) 0.00 - 0.15 mg/mg[Cre]  Urine rapid drug screen (hosp performed)not at [facility]     Status: Abnormal   Collection Time: 09/18/16  4:55 PM  Result Value Ref Range   Opiates NONE DETECTED NONE DETECTED   Cocaine NONE DETECTED NONE DETECTED   Benzodiazepines NONE DETECTED NONE DETECTED   Amphetamines NONE DETECTED NONE DETECTED   Tetrahydrocannabinol POSITIVE (A) NONE DETECTED   Barbiturates NONE DETECTED NONE DETECTED  Comprehensive metabolic panel     Status: None   Collection Time: 09/18/16  5:30 PM  Result Value Ref Range   Sodium 138 135 - 145 mmol/L   Potassium 3.7 3.5 - 5.1 mmol/L   Chloride 107 101 - 111 mmol/L   CO2 23 22 - 32 mmol/L   Glucose, Bld 81 65 - 99 mg/dL   BUN 6 6 - 20 mg/dL   Creatinine, Ser 1.61 0.44 - 1.00 mg/dL   Calcium 9.2 8.9 - 09.6 mg/dL   Total Protein 7.8 6.5 - 8.1 g/dL   Albumin 3.6 3.5 - 5.0 g/dL   AST 38 15 - 41 U/L   ALT 22 14 - 54 U/L   Alkaline Phosphatase 102 38 - 126 U/L   Total Bilirubin 1.2 0.3 - 1.2 mg/dL   GFR calc non Af Amer >60 >60 mL/min   GFR calc Af Amer >60 >60 mL/min   Anion gap 8 5 - 15  CBC     Status: Abnormal   Collection Time: 09/18/16  5:30 PM  Result Value Ref Range   WBC 7.7 4.0  - 10.5 K/uL   RBC 4.56 3.87 - 5.11 MIL/uL   Hemoglobin 10.1 (L) 12.0 - 15.0 g/dL   HCT 04.5 (L) 40.9 - 81.1 %   MCV 75.0 (L) 78.0 - 100.0 fL   MCH 22.1 (L) 26.0 - 34.0 pg   MCHC 29.5 (L) 30.0 - 36.0 g/dL   RDW 91.4 (H) 78.2 -  15.5 %   Platelets 311 150 - 400 K/uL      Ct Head Wo Contrast  Result Date: 09/18/2016 CLINICAL DATA:  26 y.o. [id number] who is S/P NSVD on 08/22/16. Two days of headache and elevated blood pressure. Severe frontal headache. EXAM: CT HEAD WITHOUT CONTRAST TECHNIQUE: Contiguous axial images were obtained from the base of the skull through the vertex without intravenous contrast. COMPARISON:  Head CT dated 07/25/2013 FINDINGS: Brain: Ventricles are normal in size and configuration. All areas of the brain demonstrate normal gray-white matter attenuation. There is no mass, hemorrhage, edema or other evidence of acute parenchymal abnormality. No extra-axial hemorrhage. Vascular: No hyperdense vessel or unexpected calcification. Skull: Normal. Negative for fracture or focal lesion. Sinuses/Orbits: No acute finding. Other: None. IMPRESSION: Normal head CT.  No intracranial mass, hemorrhage or edema. Electronically Signed   By: Bary RichardStan  Maynard M.D.   On: 09/18/2016 20:20     MAU Course  Procedures  MDM 1807: D/W Dr. Macon LargeAnyanwu, will get head CT and readmit for magnesium again. Give dose of apresoline for blood pressure and toradol for headache.    Assessment and Plan   1. Preeclampsia in postpartum period    Admit to 3rd floor Magnesium for eclampsia prophylaxis Head CT negative Blood pressure management as needed   Thressa ShellerHeather Hogan, CNM 09/18/2016, 5:20 PM    Attestation of Attending Supervision of Advanced Practice Provider (PA/CNM/NP): Evaluation and management procedures were performed by the Advanced Practice Provider under my supervision and collaboration.  I have reviewed the Advanced Practice Provider's note and chart, and I agree with the management  and plan.  Jaynie CollinsUGONNA  Trysten Berti, MD, FACOG Attending Obstetrician & Gynecologist Faculty Practice, [facility]

## 2016-09-19 NOTE — Plan of Care (Signed)
Problem: Physical Regulation: Goal: Will remain free of preeclampsia complications Outcome: Progressing Patient is on Magnesium Sulfate @2g /hr

## 2016-09-19 NOTE — Plan of Care (Signed)
Problem: Physical Regulation: Goal: Will remain free of preeclampsia complications Outcome: Progressing Pt is on schedule Labetalol, Vasotec PO, and  Hydralazine and Labetalol IV protocol.

## 2016-09-20 LAB — URINALYSIS, ROUTINE W REFLEX MICROSCOPIC
BILIRUBIN URINE: NEGATIVE
Bacteria, UA: NONE SEEN
Glucose, UA: NEGATIVE mg/dL
HGB URINE DIPSTICK: NEGATIVE
KETONES UR: NEGATIVE mg/dL
Nitrite: NEGATIVE
PH: 7 (ref 5.0–8.0)
Protein, ur: 100 mg/dL — AB
Specific Gravity, Urine: 1.015 (ref 1.005–1.030)

## 2016-09-20 MED ORDER — AMLODIPINE BESYLATE 10 MG PO TABS: 10.0000 mg | ORAL_TABLET | Freq: Every day | ORAL | 2 refills | Status: DC

## 2016-09-20 MED ORDER — OXYCODONE HCL 5 MG PO TABS: 5.0000 mg | ORAL_TABLET | ORAL | 0 refills | Status: DC | PRN

## 2016-09-20 MED ORDER — IBUPROFEN 800 MG PO TABS: 800.0000 mg | ORAL_TABLET | Freq: Three times a day (TID) | ORAL | 1 refills | Status: DC | PRN

## 2016-09-20 NOTE — Discharge Instructions (Signed)
Preeclampsia and Eclampsia °Preeclampsia is a serious condition that develops only during pregnancy. It is also called toxemia of pregnancy. This condition causes high blood pressure along with other symptoms, such as swelling and headaches. These symptoms may develop as the condition gets worse. Preeclampsia may occur at 20 weeks of pregnancy or later. °Diagnosing and treating preeclampsia early is very important. If not treated early, it can cause serious problems for you and your baby. One problem it can lead to is eclampsia, which is a condition that causes muscle jerking or shaking (convulsions or seizures) in the mother. Delivering your baby is the best treatment for preeclampsia or eclampsia. Preeclampsia and eclampsia symptoms usually go away after your baby is born. °What are the causes? °The cause of preeclampsia is not known. °What increases the risk? °The following risk factors make you more likely to develop preeclampsia: °· Being pregnant for the first time. °· Having had preeclampsia during a past pregnancy. °· Having a family history of preeclampsia. °· Having high blood pressure. °· Being pregnant with twins or triplets. °· Being 35 or older. °· Being African-American. °· Having kidney disease or diabetes. °· Having medical conditions such as lupus or blood diseases. °· Being very overweight (obese). ° °What are the signs or symptoms? °The earliest signs of preeclampsia are: °· High blood pressure. °· Increased protein in your urine. Your health care provider will check for this at every visit before you give birth (prenatal visit). ° °Other symptoms that may develop as the condition gets worse include: °· Severe headaches. °· Sudden weight gain. °· Swelling of the hands, face, legs, and feet. °· Nausea and vomiting. °· Vision problems, such as blurred or double vision. °· Numbness in the face, arms, legs, and feet. °· Urinating less than usual. °· Dizziness. °· Slurred speech. °· Abdominal pain,  especially upper abdominal pain. °· Convulsions or seizures. ° °Symptoms generally go away after giving birth. °How is this diagnosed? °There are no screening tests for preeclampsia. Your health care provider will ask you about symptoms and check for signs of preeclampsia during your prenatal visits. You may also have tests that include: °· Urine tests. °· Blood tests. °· Checking your blood pressure. °· Monitoring your baby’s heart rate. °· Ultrasound. ° °How is this treated? °You and your health care provider will determine the treatment approach that is best for you. Treatment may include: °· Having more frequent prenatal exams to check for signs of preeclampsia, if you have an increased risk for preeclampsia. °· Bed rest. °· Reducing how much salt (sodium) you eat. °· Medicine to lower your blood pressure. °· Staying in the hospital, if your condition is severe. There, treatment will focus on controlling your blood pressure and the amount of fluids in your body (fluid retention). °· You may need to take medicine (magnesium sulfate) to prevent seizures. This medicine may be given as an injection or through an IV tube. °· Delivering your baby early, if your condition gets worse. You may have your labor started with medicine (induced), or you may have a cesarean delivery. ° °Follow these instructions at home: °Eating and drinking ° °· Drink enough fluid to keep your urine clear or pale yellow. °· Eat a healthy diet that is low in sodium. Do not add salt to your food. Check nutrition labels to see how much sodium a food or beverage contains. °· Avoid caffeine. °Lifestyle °· Do not use any products that contain nicotine or tobacco, such as cigarettes   and e-cigarettes. If you need help quitting, ask your health care provider. °· Do not use alcohol or drugs. °· Avoid stress as much as possible. Rest and get plenty of sleep. °General instructions °· Take over-the-counter and prescription medicines only as told by your  health care provider. °· When lying down, lie on your side. This keeps pressure off of your baby. °· When sitting or lying down, raise (elevate) your feet. Try putting some pillows underneath your lower legs. °· Exercise regularly. Ask your health care provider what kinds of exercise are best for you. °· Keep all follow-up and prenatal visits as told by your health care provider. This is important. °How is this prevented? °To prevent preeclampsia or eclampsia from developing during another pregnancy: °· Get proper medical care during pregnancy. Your health care provider may be able to prevent preeclampsia or diagnose and treat it early. °· Your health care provider may have you take a low-dose aspirin or a calcium supplement during your next pregnancy. °· You may have tests of your blood pressure and kidney function after giving birth. °· Maintain a healthy weight. Ask your health care provider for help managing weight gain during pregnancy. °· Work with your health care provider to manage any long-term (chronic) health conditions you have, such as diabetes or kidney problems. ° °Contact a health care provider if: °· You gain more weight than expected. °· You have headaches. °· You have nausea or vomiting. °· You have abdominal pain. °· You feel dizzy or light-headed. °Get help right away if: °· You develop sudden or severe swelling anywhere in your body. This usually happens in the legs. °· You gain 5 lbs (2.3 kg) or more during one week. °· You have severe: °? Abdominal pain. °? Headaches. °? Dizziness. °? Vision problems. °? Confusion. °? Nausea or vomiting. °· You have a seizure. °· You have trouble moving any part of your body. °· You develop numbness in any part of your body. °· You have trouble speaking. °· You have any abnormal bleeding. °· You pass out. °This information is not intended to replace advice given to you by your health care provider. Make sure you discuss any questions you have with your health  care provider. °Document Released: 01/31/2000 Document Revised: 10/01/2015 Document Reviewed: 09/09/2015 °Elsevier Interactive Patient Education © 2018 [employer] Inc[employer] ° °

## 2016-09-20 NOTE — Progress Notes (Signed)
Orders received from Dr Nira Retortegele for urine culture and urinalysis. Same collected and sent to lab.

## 2016-09-20 NOTE — Progress Notes (Signed)
Patient c/o abdominal cramping and urinary frequency. Pt stated that she feels like she has UTI. Urine specimen examined. It ap[ears yellow with mucus plug and sediments. Oxi 5 mg po administered for pain. We will informed MD of same.

## 2016-09-20 NOTE — Discharge Summary (Signed)
Physician Discharge Summary  Patient ID: Monique Gamble MRN: [id number] DOB/AGE: 06-19-1990 26 y.o.  Admit date: 09/18/2016 Discharge date: 09/20/2016  Admission Diagnoses:Postpartum Severe Preeclampsia  Discharge Diagnoses:  SAA  Discharged Condition: good  Hospital Course: Pt was admitted with severe PEC by BP and HA. Received magnesium x 24 hrs. CT head was negative. BP medication adjusted. Pt's HA resolved as well. Progressed to ambulating, voiding, and tolerating diet. Felt amendable for discharge home on HD # 2 with medications and follow up as listed.   Consults: None  Significant Diagnostic Studies: radiology: CT scan: head  Treatments: IV hydration and magnesium  Discharge Exam: Blood pressure 139/77, pulse 81, temperature 98.6 F (37 C), temperature source Oral, resp. rate 16, height 5\' 6"  (1.676 m), weight 72.3 kg (159 lb 7 oz), SpO2 100 %, unknown if currently breastfeeding. Lungs clear Heart RRR Abd soft + BS Ext non tender  Disposition: 01-Home or Self Care  Discharge Instructions    Call MD for:  difficulty breathing, headache or visual disturbances    Complete by:  As directed    Call MD for:  extreme fatigue    Complete by:  As directed    Call MD for:  persistant dizziness or light-headedness    Complete by:  As directed    Call MD for:  persistant nausea and vomiting    Complete by:  As directed    Call MD for:  severe uncontrolled pain    Complete by:  As directed    Diet - low sodium heart healthy    Complete by:  As directed    Increase activity slowly    Complete by:  As directed    Sexual Activity Restrictions    Complete by:  As directed    Pelvic rest until follow up appt     Allergies as of 09/20/2016   No Known Allergies     Medication List    STOP taking these medications   labetalol 200 MG tablet Commonly known as:  NORMODYNE     TAKE these medications   amLODipine 10 MG tablet Commonly known as:  NORVASC Take 1 tablet (10  mg total) by mouth daily.   enalapril 10 MG tablet Commonly known as:  VASOTEC Take 1 tablet (10 mg total) by mouth daily.   ibuprofen 800 MG tablet Commonly known as:  ADVIL,MOTRIN Take 1 tablet (800 mg total) by mouth 3 (three) times daily with meals as needed for headache or moderate pain.   oxyCODONE 5 MG immediate release tablet Commonly known as:  Oxy IR/ROXICODONE Take 1 tablet (5 mg total) by mouth every 4 (four) hours as needed for moderate pain.      Follow-up Information    [facility] Schedule an appointment as soon as possible for a visit in 1 week(s).   Specialty:  Obstetrics and Gynecology Why:  For BP check Contact information: [address] [place] [phone]          Signed: Hermina StaggersMichael L Sadee Osland 09/20/2016, 7:22 AM

## 2016-09-20 NOTE — Progress Notes (Signed)
Patient verbalized understanding of discharge instructions, medications, follow up care and signs and symptoms to watch for. Patient discharged to home. Prescription given for pain medication and patient states she has one BP med at home already and will be able to pick up the other this morning. Explained we usually give these daily at 10am and that she will continue to take both.

## 2016-09-21 LAB — URINE CULTURE: CULTURE: NO GROWTH

## 2016-09-28 ENCOUNTER — Encounter (HOSPITAL_COMMUNITY): Payer: Self-pay | Admitting: *Deleted

## 2016-09-28 ENCOUNTER — Inpatient Hospital Stay (HOSPITAL_COMMUNITY)
Admission: AD | Admit: 2016-09-28 | Discharge: 2016-09-29 | Disposition: A | Discharge status: Home / Self Care | Payer: Medicaid Other | Source: Ambulatory Visit | Attending: Obstetrics and Gynecology | Admitting: Obstetrics and Gynecology

## 2016-09-28 DIAGNOSIS — F909 Attention-deficit hyperactivity disorder, unspecified type: Secondary | ICD-10-CM | POA: Diagnosis not present

## 2016-09-28 DIAGNOSIS — Z833 Family history of diabetes mellitus: Secondary | ICD-10-CM | POA: Insufficient documentation

## 2016-09-28 DIAGNOSIS — Z87891 Personal history of nicotine dependence: Secondary | ICD-10-CM | POA: Diagnosis not present

## 2016-09-28 DIAGNOSIS — Z8249 Family history of ischemic heart disease and other diseases of the circulatory system: Secondary | ICD-10-CM | POA: Diagnosis not present

## 2016-09-28 DIAGNOSIS — I10 Essential (primary) hypertension: Secondary | ICD-10-CM | POA: Diagnosis present

## 2016-09-28 DIAGNOSIS — O1494 Unspecified pre-eclampsia, complicating childbirth: Secondary | ICD-10-CM

## 2016-09-28 DIAGNOSIS — R51 Headache: Secondary | ICD-10-CM | POA: Diagnosis not present

## 2016-09-28 DIAGNOSIS — G44209 Tension-type headache, unspecified, not intractable: Secondary | ICD-10-CM

## 2016-09-28 HISTORY — DX: Gestational (pregnancy-induced) hypertension without significant proteinuria, unspecified trimester: O13.9

## 2016-09-28 LAB — CBC WITH DIFFERENTIAL/PLATELET
BASOS PCT: 0 %
Basophils Absolute: 0 10*3/uL (ref 0.0–0.1)
EOS ABS: 0.3 10*3/uL (ref 0.0–0.7)
Eosinophils Relative: 5 %
HCT: 33.4 % — ABNORMAL LOW (ref 36.0–46.0)
HEMOGLOBIN: 10.1 g/dL — AB (ref 12.0–15.0)
Lymphocytes Relative: 56 %
Lymphs Abs: 4.1 10*3/uL — ABNORMAL HIGH (ref 0.7–4.0)
MCH: 22.9 pg — ABNORMAL LOW (ref 26.0–34.0)
MCHC: 30.2 g/dL (ref 30.0–36.0)
MCV: 75.7 fL — ABNORMAL LOW (ref 78.0–100.0)
Monocytes Absolute: 0.2 10*3/uL (ref 0.1–1.0)
Monocytes Relative: 3 %
NEUTROS PCT: 36 %
Neutro Abs: 2.6 10*3/uL (ref 1.7–7.7)
Platelets: 283 10*3/uL (ref 150–400)
RBC: 4.41 MIL/uL (ref 3.87–5.11)
RDW: 19.1 % — ABNORMAL HIGH (ref 11.5–15.5)
WBC: 7.2 10*3/uL (ref 4.0–10.5)

## 2016-09-28 LAB — COMPREHENSIVE METABOLIC PANEL
ALBUMIN: 4 g/dL (ref 3.5–5.0)
ALK PHOS: 83 U/L (ref 38–126)
ALT: 16 U/L (ref 14–54)
ANION GAP: 10 (ref 5–15)
AST: 22 U/L (ref 15–41)
BUN: 7 mg/dL (ref 6–20)
CALCIUM: 9 mg/dL (ref 8.9–10.3)
CO2: 24 mmol/L (ref 22–32)
CREATININE: 0.77 mg/dL (ref 0.44–1.00)
Chloride: 104 mmol/L (ref 101–111)
GFR calc Af Amer: >60 mL/min (ref >60)
GFR calc non Af Amer: >60 mL/min (ref >60)
GLUCOSE: 90 mg/dL (ref 65–99)
Potassium: 3.5 mmol/L (ref 3.5–5.1)
SODIUM: 138 mmol/L (ref 135–145)
Total Bilirubin: 1 mg/dL (ref 0.3–1.2)
Total Protein: 7.8 g/dL (ref 6.5–8.1)

## 2016-09-28 LAB — PROTEIN / CREATININE RATIO, URINE
Creatinine, Urine: 75 mg/dL
Total Protein, Urine: <6 mg/dL

## 2016-09-28 LAB — URINALYSIS, ROUTINE W REFLEX MICROSCOPIC
BILIRUBIN URINE: NEGATIVE
Glucose, UA: NEGATIVE mg/dL
Hgb urine dipstick: NEGATIVE
KETONES UR: NEGATIVE mg/dL
Leukocytes, UA: NEGATIVE
NITRITE: NEGATIVE
Protein, ur: NEGATIVE mg/dL
SPECIFIC GRAVITY, URINE: 1.011 (ref 1.005–1.030)
pH: 7 (ref 5.0–8.0)

## 2016-09-28 NOTE — MAU Provider Note (Signed)
History     CSN: [id number]  Arrival date and time: 09/28/16 2153   First Provider Initiated Contact with Patient 09/28/16 2355      Chief Complaint  Patient presents with  . Hypertension   HPI  [name] is a 26 y.o. [id number] who presents to MAU today with complaint of elevated BP and headache today. The patient had SVD 08/22/16. She was then re-admitted with PP pre-eclampsia on 09/01/16 and 09/18/16. She received MgSO4 and was discharged on Norvasc. She states that she is taking the Norvasc as prescribed. Tonight she developed headache and had her sister take her BP. She reports home values of 150/100 twice tonight. She rates her headache at 7/10 now. She has not taken anything for pain. She denies blurred vision, floaters, RUQ pain, LE edema today.   OB History    Gravida Para Term Preterm AB Living   6 5 5  0 1 5   SAB TAB Ectopic Multiple Live Births   0 1 0 0 5      Past Medical History:  Diagnosis Date  . ADHD (attention deficit hyperactivity disorder)   . Chlamydia   . Headache   . History of anemia   . Hypertension   . Pregnancy induced hypertension     Past Surgical History:  Procedure Laterality Date  . INDUCED ABORTION      Family History  Problem Relation Age of Onset  . Hypertension Mother   . Diabetes Sister   . Anesthesia problems Neg Hx     Social History  Substance Use Topics  . Smoking status: Former Smoker    Types: Cigars  . Smokeless tobacco: Never Used  . Alcohol use No    Allergies: No Known Allergies  No prescriptions prior to admission.    Review of Systems  Constitutional: Negative for fever.  Eyes: Negative for visual disturbance.  Cardiovascular: Negative for leg swelling.  Gastrointestinal: Negative for abdominal pain, constipation, diarrhea, nausea and vomiting.  Neurological: Positive for headaches.   Physical Exam   Blood pressure (!) 141/86, pulse 70, temperature 98.7 F (37.1 C), temperature source Oral,  resp. rate 17, height 5\' 6"  (1.676 m), weight 169 lb (76.7 kg), SpO2 100 %, unknown if currently breastfeeding.  Physical Exam  Nursing note and vitals reviewed. Constitutional: She is oriented to person, place, and time. She appears well-developed and well-nourished. No distress.  HENT:  Head: Normocephalic and atraumatic.  Cardiovascular: Normal rate.   Respiratory: Effort normal.  GI: Soft. She exhibits no distension and no mass. There is no tenderness. There is no rebound and no guarding.  Musculoskeletal: Normal range of motion. She exhibits no edema.  Neurological: She is alert and oriented to person, place, and time. She has normal reflexes.  No clonus  Skin: Skin is warm and dry. No erythema.  Psychiatric: She has a normal mood and affect.     Results for orders placed or performed during the hospital encounter of 09/28/16 (from the past 24 hour(s))  Urinalysis, Routine w reflex microscopic     Status: Abnormal   Collection Time: 09/28/16  9:55 PM  Result Value Ref Range   Color, Urine STRAW (A) YELLOW   APPearance CLEAR CLEAR   Specific Gravity, Urine 1.011 1.005 - 1.030   pH 7.0 5.0 - 8.0   Glucose, UA NEGATIVE NEGATIVE mg/dL   Hgb urine dipstick NEGATIVE NEGATIVE   Bilirubin Urine NEGATIVE NEGATIVE   Ketones, ur NEGATIVE NEGATIVE mg/dL  Protein, ur NEGATIVE NEGATIVE mg/dL   Nitrite NEGATIVE NEGATIVE   Leukocytes, UA NEGATIVE NEGATIVE  Protein / creatinine ratio, urine     Status: None   Collection Time: 09/28/16  9:55 PM  Result Value Ref Range   Creatinine, Urine 75.00 mg/dL   Total Protein, Urine <6.00 mg/dL   Protein Creatinine Ratio        0.00 - 0.15 mg/mg[Cre]  CBC with Differential/Platelet     Status: Abnormal   Collection Time: 09/28/16 10:24 PM  Result Value Ref Range   WBC 7.2 4.0 - 10.5 K/uL   RBC 4.41 3.87 - 5.11 MIL/uL   Hemoglobin 10.1 (L) 12.0 - 15.0 g/dL   HCT 16.1 (L) 09.6 - 04.5 %   MCV 75.7 (L) 78.0 - 100.0 fL   MCH 22.9 (L) 26.0 - 34.0 pg    MCHC 30.2 30.0 - 36.0 g/dL   RDW 40.9 (H) 81.1 - 91.4 %   Platelets 283 150 - 400 K/uL   Neutrophils Relative % 36 %   Neutro Abs 2.6 1.7 - 7.7 K/uL   Lymphocytes Relative 56 %   Lymphs Abs 4.1 (H) 0.7 - 4.0 K/uL   Monocytes Relative 3 %   Monocytes Absolute 0.2 0.1 - 1.0 K/uL   Eosinophils Relative 5 %   Eosinophils Absolute 0.3 0.0 - 0.7 K/uL   Basophils Relative 0 %   Basophils Absolute 0.0 0.0 - 0.1 K/uL  Comprehensive metabolic panel     Status: None   Collection Time: 09/28/16 10:24 PM  Result Value Ref Range   Sodium 138 135 - 145 mmol/L   Potassium 3.5 3.5 - 5.1 mmol/L   Chloride 104 101 - 111 mmol/L   CO2 24 22 - 32 mmol/L   Glucose, Bld 90 65 - 99 mg/dL   BUN 7 6 - 20 mg/dL   Creatinine, Ser 7.82 0.44 - 1.00 mg/dL   Calcium 9.0 8.9 - 95.6 mg/dL   Total Protein 7.8 6.5 - 8.1 g/dL   Albumin 4.0 3.5 - 5.0 g/dL   AST 22 15 - 41 U/L   ALT 16 14 - 54 U/L   Alkaline Phosphatase 83 38 - 126 U/L   Total Bilirubin 1.0 0.3 - 1.2 mg/dL   GFR calc non Af Amer >60 >60 mL/min   GFR calc Af Amer >60 >60 mL/min   Anion gap 10 5 - 15   Patient Vitals for the past 24 hrs:  BP Temp Temp src Pulse Resp SpO2 Height Weight  09/29/16 0054 (!) 141/86 - - 70 17 - - -  09/29/16 0008 137/86 - - 80 - - - -  09/29/16 0007 137/86 - - 80 15 - - -  09/28/16 2347 133/83 - - 70 15 - - -  09/28/16 2202 (!) 143/79 98.7 F (37.1 C) Oral 73 18 100 % 5\' 6"  (1.676 m) 169 lb (76.7 kg)    MAU Course  Procedures None  MDM UA, CBC, CMP and Urine protein/creatinine ratio Fioricet given for headache. Patient reports resolution of headache prior to discharge  Assessment and Plan  A: History of PP pre-eclampsia Headache Elevated blood pressure  P: Discharge home Continue Norvasc as previously prescribed Warning signs for worsening condition discussed Patient advised to follow-up with [facility] as planned tomorrow for BP check and possible medication adjustment Patient may return to MAU as  needed or if her condition were to change or worsen  Vonzella Nipple, PA-C 09/29/2016, 2:23 AM

## 2016-09-28 NOTE — MAU Note (Signed)
Pt here with c/o high blood pressure after vaginal delivery in July. Was discharged last week; had been on Magnesium during that admission and another before that. Headaches and chest pressure today; scheduled for BP check in the AM at the clinic downstairs.

## 2016-09-29 ENCOUNTER — Ambulatory Visit (INDEPENDENT_AMBULATORY_CARE_PROVIDER_SITE_OTHER): Payer: Medicaid Other | Admitting: *Deleted

## 2016-09-29 ENCOUNTER — Ambulatory Visit: Payer: Medicaid Other | Admitting: Student

## 2016-09-29 VITALS — BP 131/78 | HR 72

## 2016-09-29 DIAGNOSIS — O1494 Unspecified pre-eclampsia, complicating childbirth: Secondary | ICD-10-CM | POA: Diagnosis not present

## 2016-09-29 DIAGNOSIS — Z0130 Encounter for examination of blood pressure without abnormal findings: Secondary | ICD-10-CM

## 2016-09-29 MED ORDER — BUTALBITAL-APAP-CAFFEINE 50-325-40 MG PO TABS
2.0000 | ORAL_TABLET | Freq: Once | ORAL | Status: AC
  Administered 2016-09-29: 2 via ORAL
  Filled 2016-09-29: qty 2

## 2016-09-29 NOTE — Progress Notes (Signed)
Pt in for BP check today. Went to MAU last night. She is feeling well today. No headaches or visual disturbances. Reviewed bp reading with Dr. Erin FullingHarraway Smith and she advised that patient remain on her Norvasc 10 and followup as planned for her PP visit. Patient agreeable to this.

## 2016-09-29 NOTE — Discharge Instructions (Signed)
Tension Headache A tension headache is pain, pressure, or aching that is felt over the front and sides of your head. These headaches can last from 30 minutes to several days. Follow these instructions at home: Managing pain  Take over-the-counter and prescription medicines only as told by your doctor.  Lie down in a dark, quiet room when you have a headache.  If directed, apply ice to your head and neck area: ? Put ice in a plastic bag. ? Place a towel between your skin and the bag. ? Leave the ice on for 20 minutes, 2-3 times per day.  Use a heating pad or a hot shower to apply heat to your head and neck area as told by your doctor. Eating and drinking  Eat meals on a regular schedule.  Do not drink a lot of alcohol.  Do not use a lot of caffeine, or stop using caffeine. General instructions  Keep all follow-up visits as told by your doctor. This is important.  Keep a journal to find out if certain things bring on headaches. For example, write down: ? What you eat and drink. ? How much sleep you get. ? Any change to your diet or medicines.  Try getting a massage, or doing other things that help you to relax.  Lessen stress.  Sit up straight. Do not tighten (tense) your muscles.  Do not use tobacco products. This includes cigarettes, chewing tobacco, or e-cigarettes. If you need help quitting, ask your doctor.  Exercise regularly as told by your doctor.  Get enough sleep. This may mean 7-9 hours of sleep. Contact a doctor if:  Your symptoms are not helped by medicine.  You have a headache that feels different from your usual headache.  You feel sick to your stomach (nauseous) or you throw up (vomit).  You have a fever. Get help right away if:  Your headache becomes very bad.  You keep throwing up.  You have a stiff neck.  You have trouble seeing.  You have trouble speaking.  You have pain in your eye or ear.  Your muscles are weak or you lose muscle  control.  You lose your balance or you have trouble walking.  You feel like you will pass out (faint) or you pass out.  You have confusion. This information is not intended to replace advice given to you by your health care provider. Make sure you discuss any questions you have with your health care provider. Document Released: 04/29/2009 Document Revised: 10/03/2015 Document Reviewed: 05/28/2014 Elsevier Interactive Patient Education  2018 [employer] Inc.  

## 2016-10-14 ENCOUNTER — Telehealth: Payer: Self-pay | Admitting: *Deleted

## 2016-10-14 ENCOUNTER — Ambulatory Visit: Payer: Medicaid Other | Admitting: Advanced Practice Midwife

## 2016-10-14 ENCOUNTER — Encounter: Payer: Self-pay | Admitting: Advanced Practice Midwife

## 2016-10-14 NOTE — Telephone Encounter (Signed)
Called patient because she missed her PP visit today. Left a message asking her to call us back to reschedule this appointment.

## 2016-11-12 ENCOUNTER — Ambulatory Visit: Payer: Self-pay | Admitting: Certified Nurse Midwife

## 2016-11-23 ENCOUNTER — Ambulatory Visit (INDEPENDENT_AMBULATORY_CARE_PROVIDER_SITE_OTHER): Payer: Medicaid Other | Admitting: Advanced Practice Midwife

## 2016-11-23 VITALS — BP 157/109 | HR 75 | Ht 66.0 in | Wt 168.3 lb

## 2016-11-23 DIAGNOSIS — Z3202 Encounter for pregnancy test, result negative: Secondary | ICD-10-CM | POA: Diagnosis present

## 2016-11-23 DIAGNOSIS — I10 Essential (primary) hypertension: Secondary | ICD-10-CM

## 2016-11-23 DIAGNOSIS — Z3042 Encounter for surveillance of injectable contraceptive: Secondary | ICD-10-CM | POA: Diagnosis not present

## 2016-11-23 LAB — POCT PREGNANCY, URINE: PREG TEST UR: NEGATIVE

## 2016-11-23 MED ORDER — NAPROXEN 500 MG PO TABS: 500.0000 mg | ORAL_TABLET | Freq: Two times a day (BID) | ORAL | 1 refills | Status: DC | PRN

## 2016-11-23 MED ORDER — MEDROXYPROGESTERONE ACETATE 150 MG/ML IM SUSP: 150.0000 mg | INTRAMUSCULAR | 0 refills | Status: DC

## 2016-11-23 MED ORDER — AMLODIPINE BESYLATE 10 MG PO TABS: 10.0000 mg | ORAL_TABLET | Freq: Every day | ORAL | 2 refills | Status: DC

## 2016-11-23 MED ORDER — MEDROXYPROGESTERONE ACETATE 150 MG/ML IM SUSP
150.0000 mg | Freq: Once | INTRAMUSCULAR | Status: AC
  Administered 2016-11-23: 150 mg via INTRAMUSCULAR

## 2016-11-23 NOTE — Progress Notes (Signed)
Subjective:     Monique Gamble is a 26 y.o. female who presents for a postpartum visit. She is 14 weeks postpartum following a spontaneous vaginal delivery. I have fully reviewed the prenatal and intrapartum course. The delivery was at 37 gestational weeks. Outcome: spontaneous vaginal delivery. Anesthesia: none. Postpartum course has been complicated by hypertension. Patient was readmitted twice. She is on Norvasc  QD. She has not taken her dose today. Report + family hx of HTN, and her mom takes HCTZ with success. Baby's course has been uncomplicated. Baby is feeding by bottle - Similac Advance. Bleeding staining only. Bowel function is normal. Bladder function is normal. Patient is sexually active. Contraception method is Depo-Provera injections. Postpartum depression screening: negative.  Patient reports that she is still having headaches. She is requesting refill on oxycodone for headache. DW patient that will consider other medications for headache at this time, or consider neuro consult if headaches refractory to NSAIDS.   The following portions of the patient's history were reviewed and updated as appropriate: allergies, current medications, past family history, past medical history, past social history, past surgical history and problem list.  Review of Systems Pertinent items are noted in HPI.   Objective:    BP (!) 157/109   Pulse 75   Ht  (1.676 m)   Wt 168 lb 4.8 oz (76.3 kg)   LMP 11/09/2016 (Approximate)   BMI 27.16 kg/m   General:  alert, cooperative, appears stated age and no distress   Breasts:  negative  Lungs: clear to auscultation bilaterally  Heart:  regular rate and rhythm, S1, S2 normal, no murmur, click, rub or gallop  Abdomen: soft, non-tender; bowel sounds normal; no masses,  no organomegaly   Vulva:  not evaluated  Vagina: not evaluated  Cervix:  NA  Corpus: not examined  Adnexa:  not evaluated  Rectal Exam: Not performed.        Results for  orders placed or performed in visit on 11/23/16 (from the past 24 hour(s))  Pregnancy, urine POC     Status: None   Collection Time: 11/23/16  9:21 AM  Result Value Ref Range   Preg Test, Ur NEGATIVE NEGATIVE    Assessment:    Normal 14 week postpartum exam. Pap smear not done at today's visit.   Plan:    1. Contraception: Depo-Provera injections 2. Patient would like BTL done. Consent signed 08/18/16  3. Follow up in: 1 year or as needed.   4. Will have patient return in 1-2 weeks for blood pressure check on a day she has taken her medication. 5. Refill for Norvasc  QD, and rx for Naproxen  BID PRN for headache

## 2016-11-23 NOTE — Patient Instructions (Signed)
Laparoscopic Tubal Ligation Laparoscopic tubal ligation is a procedure to close the fallopian tubes. This is done so that you cannot get pregnant. When the fallopian tubes are closed, the eggs that your ovaries release cannot enter the uterus, and sperm cannot reach the released eggs. A laparoscopic tubal ligation is sometimes called "getting your tubes tied." You should not have this procedure if you want to get pregnant someday or if you are unsure about having more children. Tell a health care provider about:  Any allergies you have.  All medicines you are taking, including vitamins, herbs, eye drops, creams, and over-the-counter medicines.  Any problems you or family members have had with anesthetic medicines.  Any blood disorders you have.  Any surgeries you have had.  Any medical conditions you have.  Whether you are pregnant or may be pregnant.  Any past pregnancies. What are the risks? Generally, this is a safe procedure. However, problems may occur, including:  Infection.  Bleeding.  Injury to surrounding organs.  Side effects from anesthetics.  Failure of the procedure.  This procedure can increase your risk of a kind of pregnancy in which a fertilized egg attaches to the outside of the uterus (ectopic pregnancy). What happens before the procedure?  Ask your health care provider about: ? Changing or stopping your regular medicines. This is especially important if you are taking diabetes medicines or blood thinners. ? Taking medicines such as aspirin and ibuprofen. These medicines can thin your blood. Do not take these medicines before your procedure if your health care provider instructs you not to.  Follow instructions from your health care provider about eating and drinking restrictions.  Plan to have someone take you home after the procedure.  If you go home right after the procedure, plan to have someone with you for 24 hours. What happens during the  procedure?  You will be given one or more of the following: ? A medicine to help you relax (sedative). ? A medicine to numb the area (local anesthetic). ? A medicine to make you fall asleep (general anesthetic). ? A medicine that is injected into an area of your body to numb everything below the injection site (regional anesthetic).  An IV tube will be inserted into one of your veins. It will be used to give you medicines and fluids during the procedure.  Your bladder may be emptied with a small tube (catheter).  If you have been given a general anesthetic, a tube will be put down your throat to help you breathe.  Two small cuts (incisions) will be made in your lower abdomen and near your belly button.  Your abdomen will be inflated with a gas. This will let the surgeon see better and will give the surgeon room to work.  A thin, lighted tube (laparoscope) with a camera attached will be inserted into your abdomen through one of the incisions. Small instruments will be inserted through the other incision.  The fallopian tubes will be tied off, burned (cauterized), or blocked with a clip, ring, or clamp. A small portion in the center of each fallopian tube may be removed.  The gas will be released from the abdomen.  The incisions will be closed with stitches (sutures).  A bandage (dressing) will be placed over the incisions. The procedure may vary among health care providers and hospitals. What happens after the procedure?  Your blood pressure, heart rate, breathing rate, and blood oxygen level will be monitored often until the medicines you   were given have worn off.  You will be given medicine to help with pain, nausea, and vomiting as needed. This information is not intended to replace advice given to you by your health care provider. Make sure you discuss any questions you have with your health care provider. Document Released: 05/11/2000 Document Revised: 07/11/2015 Document  Reviewed: 01/13/2015 Elsevier Interactive Patient Education  2018 [employer].  Hypertension Hypertension is another name for high blood pressure. High blood pressure forces your heart to work harder to pump blood. This can cause problems over time. There are two numbers in a blood pressure reading. There is a top number (systolic) over a bottom number (diastolic). It is best to have a blood pressure below 120/80. Healthy choices can help lower your blood pressure. You may need medicine to help lower your blood pressure if:  Your blood pressure cannot be lowered with healthy choices.  Your blood pressure is higher than 130/80.  Follow these instructions at home: Eating and drinking  If directed, follow the DASH eating plan. This diet includes: ? Filling half of your plate at each meal with fruits and vegetables. ? Filling one quarter of your plate at each meal with whole grains. Whole grains include whole wheat pasta, brown rice, and whole grain bread. ? Eating or drinking low-fat dairy products, such as skim milk or low-fat yogurt. ? Filling one quarter of your plate at each meal with low-fat (lean) proteins. Low-fat proteins include fish, skinless chicken, eggs, beans, and tofu. ? Avoiding fatty meat, cured and processed meat, or chicken with skin. ? Avoiding premade or processed food.  Eat less than 1,500 mg of salt (sodium) a day.  Limit alcohol use to no more than 1 drink a day for nonpregnant women and 2 drinks a day for men. One drink equals 12 oz of beer, 5 oz of wine, or 1 oz of hard liquor. Lifestyle  Work with your doctor to stay at a healthy weight or to lose weight. Ask your doctor what the best weight is for you.  Get at least 30 minutes of exercise that causes your heart to beat faster (aerobic exercise) most days of the week. This may include walking, swimming, or biking.  Get at least 30 minutes of exercise that strengthens your muscles (resistance exercise) at  least 3 days a week. This may include lifting weights or pilates.  Do not use any products that contain nicotine or tobacco. This includes cigarettes and e-cigarettes. If you need help quitting, ask your doctor.  Check your blood pressure at home as told by your doctor.  Keep all follow-up visits as told by your doctor. This is important. Medicines  Take over-the-counter and prescription medicines only as told by your doctor. Follow directions carefully.  Do not skip doses of blood pressure medicine. The medicine does not work as well if you skip doses. Skipping doses also puts you at risk for problems.  Ask your doctor about side effects or reactions to medicines that you should watch for. Contact a doctor if:  You think you are having a reaction to the medicine you are taking.  You have headaches that keep coming back (recurring).  You feel dizzy.  You have swelling in your ankles.  You have trouble with your vision. Get help right away if:  You get a very bad headache.  You start to feel confused.  You feel weak or numb.  You feel faint.  You get very bad pain in your: ?  Chest. ? Belly (abdomen).  You throw up (vomit) more than once.  You have trouble breathing. Summary  Hypertension is another name for high blood pressure.  Making healthy choices can help lower blood pressure. If your blood pressure cannot be controlled with healthy choices, you may need to take medicine. This information is not intended to replace advice given to you by your health care provider. Make sure you discuss any questions you have with your health care provider. Document Released: 07/22/2007 Document Revised: 01/01/2016 Document Reviewed: 01/01/2016 Elsevier Interactive Patient Education  [employer].

## 2016-11-26 ENCOUNTER — Ambulatory Visit: Payer: Medicaid Other

## 2017-01-27 ENCOUNTER — Emergency Department (HOSPITAL_COMMUNITY)
Admission: EM | Admit: 2017-01-27 | Discharge: 2017-01-27 | Disposition: A | Discharge status: Home / Self Care | Payer: Medicaid Other | Attending: Emergency Medicine | Admitting: Emergency Medicine

## 2017-01-27 ENCOUNTER — Encounter (HOSPITAL_COMMUNITY): Payer: Self-pay | Admitting: *Deleted

## 2017-01-27 DIAGNOSIS — R51 Headache: Secondary | ICD-10-CM | POA: Insufficient documentation

## 2017-01-27 DIAGNOSIS — Z5321 Procedure and treatment not carried out due to patient leaving prior to being seen by health care provider: Secondary | ICD-10-CM | POA: Diagnosis not present

## 2017-01-27 NOTE — ED Triage Notes (Signed)
[facility] is from [facility] Per [facility], patient is experiencing an headache with "seeing spots" for the last two days. Patient called EMS out to check her blood pressure. Denies any nausea or vomiting. Patient reports she took blood pressure medication about 30 minutes prior. Patient is ambulatory.

## 2017-01-27 NOTE — ED Notes (Signed)
Pt gave stickers to registration and LWBS. 

## 2017-01-27 NOTE — ED Triage Notes (Signed)
Pt w/ hx of hypertension complains of headache and dizziness for the past 2 days. Pt has been taking her blood pressure medication. Pt has tried naproxen w/o relief.

## 2017-01-28 ENCOUNTER — Telehealth: Payer: Self-pay

## 2017-01-28 DIAGNOSIS — I10 Essential (primary) hypertension: Secondary | ICD-10-CM

## 2017-01-28 NOTE — Telephone Encounter (Signed)
Pt called requesting to have call back about her BP.   Contacted pt and pt asked if she could have a refill on her BP medication.  I asked pt if she had a referral to a PCP she stated "no".   [facility] and referral placed in Epic.  I also informed pt that [facility] off [address] to please contact them and then please give us a return call back.  Pt stated okay with no further questions.

## 2017-02-10 ENCOUNTER — Ambulatory Visit: Payer: Medicaid Other

## 2017-02-16 NOTE — L&D Delivery Note (Signed)
OB/[facility]N Faculty [facility] Delivery Note  Monique Gamble is a 27 y.o. [id number] s/p SVD at 5864w1d. She was admitted for spontaneous onset of labor.   ROM: 0h 3318m with clear fluid GBS Status: negative Maximum Maternal Temperature: Temp (48hrs), Avg:97.7 F (36.5 C), Min:97.1 F (36.2 C), Max:98.4 F (36.9 C)  Labor Progress: . Admitted in active labor . AROM large amount of clear fluid . Quickly progressed to complete  Delivery Date/Time: 02/01/18 at 1431 Delivery: Called to room and patient was complete and pushing, head delivered in bed as I was walking into the room. Head delivered LOA. No nuchal cord present. Shoulder and body delivered in usual fashion. Infant with spontaneous cry, placed on mother's abdomen, dried and stimulated. Cord clamped x 2 after 1-minute delay, and cut by father of baby. Cord blood drawn. Placenta delivered spontaneously with gentle cord traction. Fundus firm with massage and Pitocin. Labia, perineum, vagina, and cervix inspected inspected with no lacerations noted.   Placenta: spontaneous, intact, 3-vessel cord Complications: none Lacerations: none EBL: 347cc Analgesia: none  Postpartum Planning [x]  message to sent to schedule follow-up  [ ]  needs Tdap, flu   Infant: vigorous female  APGARs 8, 119  2610g  [name], DO OB/GYN Fellow, Faculty Practice

## 2017-02-17 ENCOUNTER — Ambulatory Visit: Payer: Medicaid Other

## 2017-04-03 ENCOUNTER — Encounter (HOSPITAL_COMMUNITY): Payer: Self-pay

## 2017-04-03 ENCOUNTER — Inpatient Hospital Stay (HOSPITAL_COMMUNITY)
Admission: AD | Admit: 2017-04-03 | Discharge: 2017-04-03 | Disposition: A | Discharge status: Home / Self Care | Payer: Medicaid Other | Source: Ambulatory Visit | Attending: Obstetrics and Gynecology | Admitting: Obstetrics and Gynecology

## 2017-04-03 ENCOUNTER — Inpatient Hospital Stay (HOSPITAL_COMMUNITY): Payer: Medicaid Other

## 2017-04-03 DIAGNOSIS — B9689 Other specified bacterial agents as the cause of diseases classified elsewhere: Secondary | ICD-10-CM

## 2017-04-03 DIAGNOSIS — Z833 Family history of diabetes mellitus: Secondary | ICD-10-CM | POA: Insufficient documentation

## 2017-04-03 DIAGNOSIS — N760 Acute vaginitis: Secondary | ICD-10-CM

## 2017-04-03 DIAGNOSIS — Z87891 Personal history of nicotine dependence: Secondary | ICD-10-CM | POA: Diagnosis not present

## 2017-04-03 DIAGNOSIS — N939 Abnormal uterine and vaginal bleeding, unspecified: Secondary | ICD-10-CM | POA: Insufficient documentation

## 2017-04-03 DIAGNOSIS — O209 Hemorrhage in early pregnancy, unspecified: Secondary | ICD-10-CM

## 2017-04-03 DIAGNOSIS — Z8249 Family history of ischemic heart disease and other diseases of the circulatory system: Secondary | ICD-10-CM | POA: Diagnosis not present

## 2017-04-03 DIAGNOSIS — Z7952 Long term (current) use of systemic steroids: Secondary | ICD-10-CM | POA: Insufficient documentation

## 2017-04-03 DIAGNOSIS — O26891 Other specified pregnancy related conditions, first trimester: Secondary | ICD-10-CM

## 2017-04-03 DIAGNOSIS — Z79899 Other long term (current) drug therapy: Secondary | ICD-10-CM | POA: Insufficient documentation

## 2017-04-03 DIAGNOSIS — R109 Unspecified abdominal pain: Secondary | ICD-10-CM | POA: Diagnosis not present

## 2017-04-03 DIAGNOSIS — I10 Essential (primary) hypertension: Secondary | ICD-10-CM | POA: Insufficient documentation

## 2017-04-03 DIAGNOSIS — Z79891 Long term (current) use of opiate analgesic: Secondary | ICD-10-CM | POA: Diagnosis not present

## 2017-04-03 DIAGNOSIS — R102 Pelvic and perineal pain: Secondary | ICD-10-CM | POA: Diagnosis not present

## 2017-04-03 DIAGNOSIS — F909 Attention-deficit hyperactivity disorder, unspecified type: Secondary | ICD-10-CM | POA: Diagnosis not present

## 2017-04-03 LAB — URINALYSIS, ROUTINE W REFLEX MICROSCOPIC
BILIRUBIN URINE: NEGATIVE
Glucose, UA: NEGATIVE mg/dL
Ketones, ur: NEGATIVE mg/dL
Leukocytes, UA: NEGATIVE
NITRITE: NEGATIVE
Protein, ur: NEGATIVE mg/dL
SPECIFIC GRAVITY, URINE: 1.027 (ref 1.005–1.030)
pH: 5 (ref 5.0–8.0)

## 2017-04-03 LAB — CBC
HEMATOCRIT: 34.2 % — AB (ref 36.0–46.0)
Hemoglobin: 11.1 g/dL — ABNORMAL LOW (ref 12.0–15.0)
MCH: 27.5 pg (ref 26.0–34.0)
MCHC: 32.5 g/dL (ref 30.0–36.0)
MCV: 84.9 fL (ref 78.0–100.0)
Platelets: 223 10*3/uL (ref 150–400)
RBC: 4.03 MIL/uL (ref 3.87–5.11)
RDW: 14.1 % (ref 11.5–15.5)
WBC: 5.6 10*3/uL (ref 4.0–10.5)

## 2017-04-03 LAB — POCT PREGNANCY, URINE: PREG TEST UR: POSITIVE — AB

## 2017-04-03 LAB — WET PREP, GENITAL
SPERM: NONE SEEN
Trich, Wet Prep: NONE SEEN
YEAST WET PREP: NONE SEEN

## 2017-04-03 LAB — HCG, QUANTITATIVE, PREGNANCY: hCG, Beta Chain, Quant, S: 10070 m[IU]/mL — ABNORMAL HIGH (ref <5)

## 2017-04-03 MED ORDER — METRONIDAZOLE 500 MG PO TABS: 500.0000 mg | ORAL_TABLET | Freq: Two times a day (BID) | ORAL | 0 refills | Status: DC

## 2017-04-03 NOTE — [facility] Provider Note (Signed)
History     CSN: [id number]  Arrival date and time: 04/03/17 4098   First Provider Initiated Contact with Patient 04/03/17 1853      Chief Complaint  Patient presents with  . Vaginal Bleeding   Vaginal Bleeding  The patient's primary symptoms include pelvic pain and vaginal bleeding. This is a new problem. The current episode started yesterday. The problem occurs constantly. The problem has been resolved. Pain severity now: 8/10. The problem affects both sides. She is pregnant. Pertinent negatives include no chills, dysuria, fever, frequency, nausea, urgency or vomiting. Vaginal bleeding amount: passed one clot last night, and has not had bleeding since.  She has been passing clots (about the size of her fist. ). She has not been passing tissue. Nothing aggravates the symptoms. She has tried nothing for the symptoms. Contraceptive use: Due Depo in January, but missed it.  Her menstrual history has been irregular. (CHTN, on norvasc 10mg  Q DAY )     Past Medical History:  Diagnosis Date  . ADHD (attention deficit hyperactivity disorder)   . Chlamydia   . Headache   . History of anemia   . Hypertension   . Pregnancy induced hypertension     Past Surgical History:  Procedure Laterality Date  . INDUCED ABORTION      Family History  Problem Relation Age of Onset  . Hypertension Mother   . Diabetes Sister   . Anesthesia problems Neg Hx     Social History   Tobacco Use  . Smoking status: Former Smoker    Types: Cigars  . Smokeless tobacco: Never Used  Substance Use Topics  . Alcohol use: No  . Drug use: Yes    Types: Marijuana    Comment: last use one month ago    Allergies: No Known Allergies  Medications Prior to Admission  Medication Sig Dispense Refill Last Dose  . amLODipine (NORVASC) 10 MG tablet Take 1 tablet (10 mg total) by mouth daily. 30 tablet 2   . ibuprofen (ADVIL,MOTRIN) 800 MG tablet Take 1 tablet (800 mg total) by mouth 3 (three) times daily with  meals as needed for headache or moderate pain. (Patient not taking: Reported on 11/23/2016) 30 tablet 1 Not Taking  . naproxen (NAPROSYN) 500 MG tablet Take 1 tablet (500 mg total) by mouth 2 (two) times daily as needed. 60 tablet 1   . oxyCODONE (OXY IR/ROXICODONE) 5 MG immediate release tablet Take 1 tablet (5 mg total) by mouth every 4 (four) hours as needed for moderate pain. (Patient not taking: Reported on 11/23/2016) 30 tablet 0 Not Taking    Review of Systems  Constitutional: Negative for chills and fever.  Gastrointestinal: Negative for nausea and vomiting.  Genitourinary: Positive for pelvic pain and vaginal bleeding. Negative for dysuria, frequency and urgency.   Physical Exam   Blood pressure 133/78, pulse 93, temperature (!) 97.5 F (36.4 C), temperature source Oral, resp. rate 16, height 5\' 6"  (1.676 m), weight 171 lb (77.6 kg), last menstrual period 02/11/2017, unknown if currently breastfeeding.  Physical Exam  Nursing note and vitals reviewed. Constitutional: She is oriented to person, place, and time. She appears well-developed and well-nourished. No distress.  HENT:  Head: Normocephalic.  Cardiovascular: Normal rate.  Respiratory: Effort normal.  GI: Soft. There is no tenderness. There is no rebound.  Neurological: She is alert and oriented to person, place, and time.  Skin: Skin is warm and dry.  Psychiatric: She has a normal mood and affect.  Results for orders placed or performed during the hospital encounter of 04/03/17 (from the past 24 hour(s))  Urinalysis, Routine w reflex microscopic     Status: Abnormal   Collection Time: 04/03/17  6:32 PM  Result Value Ref Range   Color, Urine YELLOW YELLOW   APPearance CLEAR CLEAR   Specific Gravity, Urine 1.027 1.005 - 1.030   pH 5.0 5.0 - 8.0   Glucose, UA NEGATIVE NEGATIVE mg/dL   Hgb urine dipstick MODERATE (A) NEGATIVE   Bilirubin Urine NEGATIVE NEGATIVE   Ketones, ur NEGATIVE NEGATIVE mg/dL   Protein, ur  NEGATIVE NEGATIVE mg/dL   Nitrite NEGATIVE NEGATIVE   Leukocytes, UA NEGATIVE NEGATIVE   RBC / HPF 0-5 0 - 5 RBC/hpf   WBC, UA 0-5 0 - 5 WBC/hpf   Bacteria, UA RARE (A) NONE SEEN   Squamous Epithelial / LPF 6-30 (A) NONE SEEN   Mucus PRESENT   Pregnancy, urine POC     Status: Abnormal   Collection Time: 04/03/17  6:48 PM  Result Value Ref Range   Preg Test, Ur POSITIVE (A) NEGATIVE  CBC     Status: Abnormal   Collection Time: 04/03/17  7:02 PM  Result Value Ref Range   WBC 5.6 4.0 - 10.5 K/uL   RBC 4.03 3.87 - 5.11 MIL/uL   Hemoglobin 11.1 (L) 12.0 - 15.0 g/dL   HCT 16.134.2 (L) 09.636.0 - 04.546.0 %   MCV 84.9 78.0 - 100.0 fL   MCH 27.5 26.0 - 34.0 pg   MCHC 32.5 30.0 - 36.0 g/dL   RDW 40.914.1 81.111.5 - 91.415.5 %   Platelets 223 150 - 400 K/uL  Wet prep, genital     Status: Abnormal   Collection Time: 04/03/17  7:18 PM  Result Value Ref Range   Yeast Wet Prep HPF POC NONE SEEN NONE SEEN   Trich, Wet Prep NONE SEEN NONE SEEN   Clue Cells Wet Prep HPF POC PRESENT (A) NONE SEEN   WBC, Wet Prep HPF POC MODERATE (A) NONE SEEN   Sperm NONE SEEN    Results for orders placed or performed during the hospital encounter of 04/03/17 (from the past 24 hour(s))  Urinalysis, Routine w reflex microscopic     Status: Abnormal   Collection Time: 04/03/17  6:32 PM  Result Value Ref Range   Color, Urine YELLOW YELLOW   APPearance CLEAR CLEAR   Specific Gravity, Urine 1.027 1.005 - 1.030   pH 5.0 5.0 - 8.0   Glucose, UA NEGATIVE NEGATIVE mg/dL   Hgb urine dipstick MODERATE (A) NEGATIVE   Bilirubin Urine NEGATIVE NEGATIVE   Ketones, ur NEGATIVE NEGATIVE mg/dL   Protein, ur NEGATIVE NEGATIVE mg/dL   Nitrite NEGATIVE NEGATIVE   Leukocytes, UA NEGATIVE NEGATIVE   RBC / HPF 0-5 0 - 5 RBC/hpf   WBC, UA 0-5 0 - 5 WBC/hpf   Bacteria, UA RARE (A) NONE SEEN   Squamous Epithelial / LPF 6-30 (A) NONE SEEN   Mucus PRESENT   Pregnancy, urine POC     Status: Abnormal   Collection Time: 04/03/17  6:48 PM  Result Value  Ref Range   Preg Test, Ur POSITIVE (A) NEGATIVE  CBC     Status: Abnormal   Collection Time: 04/03/17  7:02 PM  Result Value Ref Range   WBC 5.6 4.0 - 10.5 K/uL   RBC 4.03 3.87 - 5.11 MIL/uL   Hemoglobin 11.1 (L) 12.0 - 15.0 g/dL   HCT 78.234.2 (L) 95.636.0 -  46.0 %   MCV 84.9 78.0 - 100.0 fL   MCH 27.5 26.0 - 34.0 pg   MCHC 32.5 30.0 - 36.0 g/dL   RDW 16.1 09.6 - 04.5 %   Platelets 223 150 - 400 K/uL  hCG, quantitative, pregnancy     Status: Abnormal   Collection Time: 04/03/17  7:02 PM  Result Value Ref Range   hCG, Beta Chain, Quant, S 10,070 (H) <5 mIU/mL  Wet prep, genital     Status: Abnormal   Collection Time: 04/03/17  7:18 PM  Result Value Ref Range   Yeast Wet Prep HPF POC NONE SEEN NONE SEEN   Trich, Wet Prep NONE SEEN NONE SEEN   Clue Cells Wet Prep HPF POC PRESENT (A) NONE SEEN   WBC, Wet Prep HPF POC MODERATE (A) NONE SEEN   Sperm NONE SEEN     MAU Course  Procedures  MDM 1950: Care turned over to Zerita Boers, CNM US/HCG pending Thressa Sheller 7:50 PM 04/03/17   Assessment and Plan  [place] does not show IUP or ectpoic. VSS, pt alert and oriented x 3. abd soft and non tender. Wet prep pos Clue. Will d/c home to repeat quant Monday in clinic

## 2017-04-03 NOTE — Discharge Instructions (Signed)
Bacterial Vaginosis Bacterial vaginosis is an infection of the vagina. It happens when too many germs (bacteria) grow in the vagina. This infection puts you at risk for infections from sex (STIs). Treating this infection can lower your risk for some STIs. You should also treat this if you are pregnant. It can cause your baby to be born early. Follow these instructions at home: Medicines  Take over-the-counter and prescription medicines only as told by your doctor.  Take or use your antibiotic medicine as told by your doctor. Do not stop taking or using it even if you start to feel better. General instructions  If you your sexual partner is a woman, tell her that you have this infection. She needs to get treatment if she has symptoms. If you have a female partner, he does not need to be treated.  During treatment: ? Avoid sex. ? Do not douche. ? Avoid alcohol as told. ? Avoid breastfeeding as told.  Drink enough fluid to keep your pee (urine) clear or pale yellow.  Keep your vagina and butt (rectum) clean. ? Wash the area with warm water every day. ? Wipe from front to back after you use the toilet.  Keep all follow-up visits as told by your doctor. This is important. Preventing this condition  Do not douche.  Use only warm water to wash around your vagina.  Use protection when you have sex. This includes: ? Latex condoms. ? Dental dams.  Limit how many people you have sex with. It is best to only have sex with the same person (be monogamous).  Get tested for STIs. Have your partner get tested.  Wear underwear that is cotton or lined with cotton.  Avoid tight pants and pantyhose. This is most important in summer.  Do not use any products that have nicotine or tobacco in them. These include cigarettes and e-cigarettes. If you need help quitting, ask your doctor.  Do not use illegal drugs.  Limit how much alcohol you drink. Contact a doctor if:  Your symptoms do not get  better, even after you are treated.  You have more discharge or pain when you pee (urinate).  You have a fever.  You have pain in your belly (abdomen).  You have pain with sex.  Your bleed from your vagina between periods. Summary  This infection happens when too many germs (bacteria) grow in the vagina.  Treating this condition can lower your risk for some infections from sex (STIs).  You should also treat this if you are pregnant. It can cause early (premature) birth.  Do not stop taking or using your antibiotic medicine even if you start to feel better. This information is not intended to replace advice given to you by your health care provider. Make sure you discuss any questions you have with your health care provider. Document Released: 11/12/2007 Document Revised: 10/19/2015 Document Reviewed: 10/19/2015 Elsevier Interactive Patient Education  2017 [employer] Abdominal Pain During Pregnancy Abdominal pain is common in pregnancy. Most of the time, it does not cause harm. There are many causes of abdominal pain. Some causes are more serious than others and sometimes the cause is not known. Abdominal pain can be a sign that something is very wrong with the pregnancy or the pain may have nothing to do with the pregnancy. Always tell your health care provider if you have any abdominal pain. Follow these instructions at home:  Do not have sex or put anything in your vagina until your  symptoms go away completely. °· Watch your abdominal pain for any changes. °· Get plenty of rest until your pain improves. °· Drink enough fluid to keep your urine clear or pale yellow. °· Take over-the-counter or prescription medicines only as told by your health care provider. °· Keep all follow-up visits as told by your health care provider. This is important. °Contact a health care provider if: °· You have a fever. °· Your pain gets worse or you have cramping. °· Your pain continues after  resting. °Get help right away if: °· You are bleeding, leaking fluid, or passing tissue from the vagina. °· You have vomiting or diarrhea that does not go away. °· You have painful or bloody urination. °· You notice a decrease in your baby's movements. °· You feel very weak or faint. °· You have shortness of breath. °· You develop a severe headache with abdominal pain. °· You have abnormal vaginal discharge with abdominal pain. °This information is not intended to replace advice given to you by your health care provider. Make sure you discuss any questions you have with your health care provider. °Document Released: 02/02/2005 Document Revised: 11/14/2015 Document Reviewed: 09/01/2012 °Elsevier Interactive Patient Education © 2018 [employer] ° °

## 2017-04-03 NOTE — MAU Note (Signed)
Last night about 1 or 2 am passed a fist sized blood clot, having spotting. Was on Depo missed shot. Was supposed to get around the 1/3.  Cramping in side and lower stomach.

## 2017-04-05 ENCOUNTER — Ambulatory Visit: Payer: Medicaid Other

## 2017-04-05 LAB — GC/CHLAMYDIA PROBE AMP (~~LOC~~) NOT AT ARMC
CHLAMYDIA, DNA PROBE: NEGATIVE
NEISSERIA GONORRHEA: NEGATIVE

## 2017-04-07 ENCOUNTER — Telehealth: Payer: Self-pay | Admitting: Obstetrics & Gynecology

## 2017-04-07 ENCOUNTER — Encounter: Payer: Self-pay | Admitting: *Deleted

## 2017-04-07 NOTE — Telephone Encounter (Signed)
Patient is requesting pain medic

## 2017-04-07 NOTE — Telephone Encounter (Signed)
Patient phoned office at 1718 and spoke with [facility] who directed her to go to [facility] for evalutation.

## 2017-04-09 ENCOUNTER — Ambulatory Visit: Payer: Medicaid Other

## 2017-04-09 ENCOUNTER — Encounter: Payer: Self-pay | Admitting: *Deleted

## 2017-04-09 ENCOUNTER — Telehealth: Payer: Self-pay | Admitting: *Deleted

## 2017-04-09 NOTE — Telephone Encounter (Signed)
Monique ReeveQuendra L Gamble did not keep her scheduled appointment for follow up  Stat bhcg.I called and left a message notifying her she missed her stat bhcg and she should go to [facility] over the weekend for follow up.  I also am sending a certified letter.

## 2017-07-05 ENCOUNTER — Encounter (HOSPITAL_COMMUNITY): Payer: Self-pay | Admitting: *Deleted

## 2017-07-05 ENCOUNTER — Inpatient Hospital Stay (HOSPITAL_COMMUNITY)
Admission: AD | Admit: 2017-07-05 | Discharge: 2017-07-06 | Disposition: A | Discharge status: Home / Self Care | Payer: Medicaid Other | Source: Ambulatory Visit | Attending: Obstetrics & Gynecology | Admitting: Obstetrics & Gynecology

## 2017-07-05 DIAGNOSIS — O10911 Unspecified pre-existing hypertension complicating pregnancy, first trimester: Secondary | ICD-10-CM | POA: Insufficient documentation

## 2017-07-05 DIAGNOSIS — O26891 Other specified pregnancy related conditions, first trimester: Secondary | ICD-10-CM | POA: Diagnosis not present

## 2017-07-05 DIAGNOSIS — Z79899 Other long term (current) drug therapy: Secondary | ICD-10-CM | POA: Insufficient documentation

## 2017-07-05 DIAGNOSIS — O2341 Unspecified infection of urinary tract in pregnancy, first trimester: Secondary | ICD-10-CM | POA: Insufficient documentation

## 2017-07-05 DIAGNOSIS — G44201 Tension-type headache, unspecified, intractable: Secondary | ICD-10-CM | POA: Diagnosis not present

## 2017-07-05 DIAGNOSIS — Z87891 Personal history of nicotine dependence: Secondary | ICD-10-CM | POA: Diagnosis not present

## 2017-07-05 DIAGNOSIS — F909 Attention-deficit hyperactivity disorder, unspecified type: Secondary | ICD-10-CM | POA: Insufficient documentation

## 2017-07-05 DIAGNOSIS — Z3A01 Less than 8 weeks gestation of pregnancy: Secondary | ICD-10-CM | POA: Diagnosis not present

## 2017-07-05 DIAGNOSIS — O99341 Other mental disorders complicating pregnancy, first trimester: Secondary | ICD-10-CM | POA: Diagnosis not present

## 2017-07-05 DIAGNOSIS — O219 Vomiting of pregnancy, unspecified: Secondary | ICD-10-CM

## 2017-07-05 DIAGNOSIS — I10 Essential (primary) hypertension: Secondary | ICD-10-CM | POA: Diagnosis present

## 2017-07-05 DIAGNOSIS — R51 Headache: Secondary | ICD-10-CM | POA: Diagnosis present

## 2017-07-05 LAB — BASIC METABOLIC PANEL
ANION GAP: 11 (ref 5–15)
BUN: 7 mg/dL (ref 6–20)
CALCIUM: 9.3 mg/dL (ref 8.9–10.3)
CHLORIDE: 104 mmol/L (ref 101–111)
CO2: 22 mmol/L (ref 22–32)
Creatinine, Ser: 0.54 mg/dL (ref 0.44–1.00)
GFR calc non Af Amer: >60 mL/min (ref >60)
Glucose, Bld: 84 mg/dL (ref 65–99)
Potassium: 4.4 mmol/L (ref 3.5–5.1)
SODIUM: 137 mmol/L (ref 135–145)

## 2017-07-05 LAB — URINALYSIS, ROUTINE W REFLEX MICROSCOPIC
Bilirubin Urine: NEGATIVE
GLUCOSE, UA: NEGATIVE mg/dL
Hgb urine dipstick: NEGATIVE
Ketones, ur: 80 mg/dL — AB
Leukocytes, UA: NEGATIVE
Nitrite: POSITIVE — AB
PROTEIN: 30 mg/dL — AB
Specific Gravity, Urine: 1.029 (ref 1.005–1.030)
pH: 6 (ref 5.0–8.0)

## 2017-07-05 LAB — WET PREP, GENITAL
Sperm: NONE SEEN
TRICH WET PREP: NONE SEEN
YEAST WET PREP: NONE SEEN

## 2017-07-05 LAB — POCT PREGNANCY, URINE: Preg Test, Ur: POSITIVE — AB

## 2017-07-05 MED ORDER — LABETALOL HCL 100 MG PO TABS: 100.0000 mg | ORAL_TABLET | Freq: Two times a day (BID) | ORAL | 2 refills | Status: DC

## 2017-07-05 MED ORDER — M.V.I. ADULT IV INJ
Freq: Once | INTRAVENOUS | Status: AC
  Administered 2017-07-05: 23:00:00 via INTRAVENOUS
  Filled 2017-07-05: qty 10

## 2017-07-05 MED ORDER — LACTATED RINGERS IV BOLUS
1000.0000 mL | Freq: Once | INTRAVENOUS | Status: AC
  Administered 2017-07-05: 1000 mL via INTRAVENOUS

## 2017-07-05 MED ORDER — FAMOTIDINE IN NACL 20-0.9 MG/50ML-% IV SOLN
20.0000 mg | Freq: Once | INTRAVENOUS | Status: AC
  Administered 2017-07-05: 20 mg via INTRAVENOUS
  Filled 2017-07-05: qty 50

## 2017-07-05 MED ORDER — PROMETHAZINE HCL 25 MG/ML IJ SOLN
25.0000 mg | Freq: Once | INTRAMUSCULAR | Status: AC
  Administered 2017-07-05: 25 mg via INTRAVENOUS
  Filled 2017-07-05: qty 1

## 2017-07-05 MED ORDER — PROMETHAZINE HCL 25 MG PO TABS: 25.0000 mg | ORAL_TABLET | Freq: Four times a day (QID) | ORAL | 3 refills | Status: DC | PRN

## 2017-07-05 NOTE — MAU Note (Signed)
Pt reports vomiting and has had a headache for 3 days, thinks she may be pregnant but has not done a test

## 2017-07-05 NOTE — MAU Provider Note (Addendum)
History     CSN: [id number]  Arrival date and time: 07/05/17 1733   First Provider Initiated Contact with Patient 07/05/17 2038      Chief Complaint  Patient presents with  . Possible Pregnancy  . Headache  . Emesis  . Nausea   HPI Monique Gamble is a 27 y.o. [id number] at [redacted]w[redacted]d by LMP who presents with headache, hypertension, and n/v.  She has chronic hypertension. Had been taking norvasc 10 mg but ran out of her meds a few weeks ago. Reports headache today. Rates pain 7/10. Has not treated headache. Nothing makes pain better or worse. Has had nausea & vomiting for the last 2 days. States she hasn't been able to keep down any food or fluids in the last 2 days. Does not have any antiemetics at home. Denies fever/chills, diarrhea, vaginal bleeding, or abdominal pain.  Denies vaginal discharge or dysuria.   OB History    Gravida  8   Para  5   Term  5   Preterm  0   AB  2   Living  5     SAB  1   TAB  1   Ectopic  0   Multiple  0   Live Births  5           Past Medical History:  Diagnosis Date  . ADHD (attention deficit hyperactivity disorder)   . Chlamydia   . Headache   . History of anemia   . Hypertension   . Pregnancy induced hypertension     Past Surgical History:  Procedure Laterality Date  . INDUCED ABORTION      Family History  Problem Relation Age of Onset  . Hypertension Mother   . Diabetes Sister   . Anesthesia problems Neg Hx     Social History   Tobacco Use  . Smoking status: Former Smoker    Types: Cigars    Last attempt to quit: 07/05/2008    Years since quitting: 9.0  . Smokeless tobacco: Never Used  Substance Use Topics  . Alcohol use: No  . Drug use: Yes    Types: Marijuana    Comment: last use 03 Jul 2017    Allergies: No Known Allergies  Medications Prior to Admission  Medication Sig Dispense Refill Last Dose  . amLODipine (NORVASC) 10 MG tablet Take 1 tablet (10 mg total) by mouth daily. 30 tablet 2 Past  Week at Unknown time  . metroNIDAZOLE (FLAGYL) 500 MG tablet Take 1 tablet (500 mg total) by mouth 2 (two) times daily. 14 tablet 0 More than a month at Unknown time    Review of Systems  Constitutional: Negative.   Gastrointestinal: Positive for nausea and vomiting. Negative for abdominal pain, constipation and diarrhea.  Genitourinary: Negative.   Neurological: Positive for headaches.   Physical Exam   Blood pressure (!) 155/91, pulse 72, temperature 99 F (37.2 C), temperature source Oral, resp. rate 16, height  (1.676 m), weight 163 lb (73.9 kg), last menstrual period 05/17/2017, SpO2 100 %, not currently breastfeeding.  Physical Exam  Nursing note and vitals reviewed. Constitutional: She is oriented to person, place, and time. She appears well-developed and well-nourished. No distress.  HENT:  Head: Normocephalic and atraumatic.  Eyes: Conjunctivae are normal. Right eye exhibits no discharge. Left eye exhibits no discharge. No scleral icterus.  Neck: Normal range of motion.  Cardiovascular: Normal rate, regular rhythm and normal heart sounds.  No murmur heard. Respiratory: Effort  normal and breath sounds normal. No respiratory distress. She has no wheezes.  GI: Soft. Bowel sounds are normal. She exhibits no distension. There is no tenderness.  Neurological: She is alert and oriented to person, place, and time.  Skin: Skin is warm and dry. She is not diaphoretic.  Psychiatric: She has a normal mood and affect. Her behavior is normal. Judgment and thought content normal.    MAU Course  Procedures Results for orders placed or performed during the hospital encounter of 07/05/17 (from the past 24 hour(s))  Urinalysis, Routine w reflex microscopic     Status: Abnormal   Collection Time: 07/05/17  6:25 PM  Result Value Ref Range   Color, Urine AMBER (A) YELLOW   APPearance HAZY (A) CLEAR   Specific Gravity, Urine 1.029 1.005 - 1.030   pH 6.0 5.0 - 8.0   Glucose, UA  NEGATIVE NEGATIVE mg/dL   Hgb urine dipstick NEGATIVE NEGATIVE   Bilirubin Urine NEGATIVE NEGATIVE   Ketones, ur 80 (A) NEGATIVE mg/dL   Protein, ur 30 (A) NEGATIVE mg/dL   Nitrite POSITIVE (A) NEGATIVE   Leukocytes, UA NEGATIVE NEGATIVE   RBC / HPF 0-5 0 - 5 RBC/hpf   WBC, UA 0-5 0 - 5 WBC/hpf   Bacteria, UA MANY (A) NONE SEEN   Squamous Epithelial / LPF 6-10 0 - 5   Mucus PRESENT   Pregnancy, urine POC     Status: Abnormal   Collection Time: 07/05/17  6:46 PM  Result Value Ref Range   Preg Test, Ur POSITIVE (A) NEGATIVE  Basic metabolic panel     Status: None   Collection Time: 07/05/17  9:15 PM  Result Value Ref Range   Sodium 137 135 - 145 mmol/L   Potassium 4.4 3.5 - 5.1 mmol/L   Chloride 104 101 - 111 mmol/L   CO2 22 22 - 32 mmol/L   Glucose, Bld 84 65 - 99 mg/dL   BUN 7 6 - 20 mg/dL   Creatinine, Ser 1.61 0.44 - 1.00 mg/dL   Calcium 9.3 8.9 - 09.6 mg/dL   GFR calc non Af Amer >60 >60 mL/min   GFR calc Af Amer >60 >60 mL/min   Anion gap 11 5 - 15    MDM UPT positive U/a shows nitrites, no WBCs--- pt denies urinary symptoms, will collect wet prep and send for urine culture. 80+ ketones on urine. Will give IV fluids & antiemetics  Care turned over to Steward Drone CNM   Judeth Horn, NP 07/05/2017 9:56 PM   Patient reports feeling better after treatment in MAU with IV fluids and phenergan IV. Patient able to keep down crackers and juice prior to discharge.   Discussed importance of patient taking BP medication as prescribed starting in the morning and being seen in the office this week for a BP check. Patient verbalizes understanding. Office will call patient with scheduled appointment.   Pt discharged. Pt stable at time of discharge.   Assessment and Plan   1. Nausea and vomiting during pregnancy   2. Acute intractable tension-type headache   3. Maternal chronic hypertension in first trimester   4. UTI in pregnancy, antepartum, first trimester    Discharge  home. Patient stable at time of discharge. Follow up in the office this week for BP check and dating [place] Rx for Labetalol, Keflex and phenergan sent to pharmacy of choice  Discussed reasons to return to MAU   Allergies as of 07/06/2017   No Known Allergies  Medication List    STOP taking these medications   amLODipine 10 MG tablet Commonly known as:  NORVASC   metroNIDAZOLE 500 MG tablet Commonly known as:  FLAGYL     TAKE these medications   cephALEXin 500 MG capsule Commonly known as:  KEFLEX Take 1 capsule (500 mg total) by mouth 4 (four) times daily.   labetalol 100 MG tablet Commonly known as:  NORMODYNE Take 1 tablet (100 mg total) by mouth 2 (two) times daily.   promethazine 25 MG tablet Commonly known as:  PHENERGAN Take 1 tablet (25 mg total) by mouth every 6 (six) hours as needed for nausea or vomiting.      Sharyon Cable, CNM 07/06/17, 2:25 AM

## 2017-07-06 MED ORDER — PROMETHAZINE HCL 25 MG PO TABS: 25.0000 mg | ORAL_TABLET | Freq: Four times a day (QID) | ORAL | 3 refills | Status: DC | PRN

## 2017-07-06 MED ORDER — CEPHALEXIN 500 MG PO CAPS: 500.0000 mg | ORAL_CAPSULE | Freq: Four times a day (QID) | ORAL | 0 refills | Status: DC

## 2017-07-06 MED ORDER — LABETALOL HCL 100 MG PO TABS: 100.0000 mg | ORAL_TABLET | Freq: Two times a day (BID) | ORAL | 2 refills | Status: DC

## 2017-07-08 ENCOUNTER — Ambulatory Visit: Payer: Self-pay

## 2017-07-08 ENCOUNTER — Encounter: Payer: Self-pay | Admitting: *Deleted

## 2017-07-08 ENCOUNTER — Other Ambulatory Visit: Payer: Self-pay | Admitting: Certified Nurse Midwife

## 2017-07-08 ENCOUNTER — Telehealth: Payer: Self-pay | Admitting: *Deleted

## 2017-07-08 DIAGNOSIS — Z3490 Encounter for supervision of normal pregnancy, unspecified, unspecified trimester: Secondary | ICD-10-CM

## 2017-07-08 LAB — CULTURE, OB URINE: Culture: >=100000 — AB

## 2017-07-08 NOTE — Progress Notes (Signed)
Patient was seen in MAU on 5/20. Order placed for outpatient [place] for dating and viability. [place] to call patient when scheduled.   Sharyon Cable, CNM 07/08/17, 3:13 PM

## 2017-07-08 NOTE — Telephone Encounter (Signed)
Pt. DNKA bp check appointment for today. I called number on file and left a message you missed your scheduled nurse visit. Please call our office to reschedule.  Will send letter.

## 2017-08-03 ENCOUNTER — Other Ambulatory Visit: Payer: Self-pay | Admitting: Certified Nurse Midwife

## 2017-08-03 ENCOUNTER — Ambulatory Visit (HOSPITAL_COMMUNITY)
Admission: RE | Admit: 2017-08-03 | Discharge: 2017-08-03 | Disposition: A | Discharge status: Home / Self Care | Payer: Medicaid Other | Source: Ambulatory Visit | Attending: Certified Nurse Midwife | Admitting: Certified Nurse Midwife

## 2017-08-03 ENCOUNTER — Ambulatory Visit (INDEPENDENT_AMBULATORY_CARE_PROVIDER_SITE_OTHER): Payer: Medicaid Other | Admitting: General Practice

## 2017-08-03 VITALS — BP 148/88 | HR 85 | Ht 66.0 in | Wt 164.0 lb

## 2017-08-03 DIAGNOSIS — Z3490 Encounter for supervision of normal pregnancy, unspecified, unspecified trimester: Secondary | ICD-10-CM

## 2017-08-03 DIAGNOSIS — Z3687 Encounter for antenatal screening for uncertain dates: Secondary | ICD-10-CM | POA: Insufficient documentation

## 2017-08-03 DIAGNOSIS — R112 Nausea with vomiting, unspecified: Secondary | ICD-10-CM

## 2017-08-03 DIAGNOSIS — Z0130 Encounter for examination of blood pressure without abnormal findings: Secondary | ICD-10-CM

## 2017-08-03 DIAGNOSIS — Z3A10 10 weeks gestation of pregnancy: Secondary | ICD-10-CM | POA: Insufficient documentation

## 2017-08-03 DIAGNOSIS — Z712 Person consulting for explanation of examination or test findings: Secondary | ICD-10-CM

## 2017-08-03 MED ORDER — ONDANSETRON 4 MG PO TBDP: 4.0000 mg | ORAL_TABLET | Freq: Three times a day (TID) | ORAL | 0 refills | Status: DC | PRN

## 2017-08-03 NOTE — Progress Notes (Addendum)
Patient presents to office today for BP check & results. Reviewed viability ultrasound results with Dr Vergie LivingPickens who finds living IUP- patient should begin prenatal care. Informed patient of results, gave pictures from ultrasound & reviewed dating. Patient denies headaches, dizziness or blurry vision. Patient reports nausea/vomiting making it difficult to keep antibiotics down. New Rx for Zofran ODT given to patient & patient informed. Recommended she schedule a new OB appt. Patient verbalized understanding & had no questions.

## 2017-08-04 NOTE — Progress Notes (Signed)
I have reviewed the chart and agree with nursing staff's documentation of this patient's encounter.  Martin Bingharlie Odean Mcelwain, MD 08/04/2017 9:50 AM

## 2017-08-26 ENCOUNTER — Encounter: Payer: Medicaid Other | Admitting: Obstetrics & Gynecology

## 2017-09-16 NOTE — BH Specialist Note (Deleted)
Integrated Behavioral Health Initial Visit  MRN: [id number] Name: Monique PaciQuendra L Henle  Number of Integrated Behavioral Health Clinician visits:: 1/6 Session Start time: ***  Session End time: *** Total time: {IBH Total Time:21014050}  Type of Service: Integrated Behavioral Health- Individual/Family Interpretor:No. Interpretor Name and Language: n/a   Warm Hand Off Completed.       SUBJECTIVE: Monique Gamble is a 27 y.o. female accompanied by {CHL AMB ACCOMPANIED WU:[id number]}[phone]} Patient was referred by Nettie ElmMichael Ervin, MD for initial OB introduction to integrated behavioral health services. Patient reports the following symptoms/concerns: *** Duration of problem: ***; Severity of problem: {Mild/Moderate/Severe:20260}  OBJECTIVE: Mood: {BHH MOOD:22306} and Affect: {BHH AFFECT:22307} Risk of harm to self or others: {CHL AMB BH Suicide Current Mental Status:21022748}  LIFE CONTEXT: Family and Social: *** School/Work: *** Self-Care: *** Life Changes: ***  GOALS ADDRESSED: Patient will: 1. Reduce symptoms of: {IBH Symptoms:21014056} 2. Increase knowledge and/or ability of: {IBH Patient Tools:21014057}  3. Demonstrate ability to: {IBH Goals:21014053}  INTERVENTIONS: Interventions utilized: {IBH Interventions:21014054}  Standardized Assessments completed: {IBH Screening Tools:21014051}  ASSESSMENT: Patient currently experiencing ***.   Patient may benefit from ***.  PLAN: 1. Follow up with behavioral health clinician on : *** 2. Behavioral recommendations: *** 3. Referral(s): {IBH Referrals:21014055} 4. "From scale of 1-10, how likely are you to follow plan?": ***  Rae LipsJamie C Linh Johannes, LCSW  [facility] screen [facility] 2/[facility] 11/23/2016 08/18/2016 01/15/2015 02/14/2014  Decreased Interest 2 0 0 0  Down, Depressed, Hopeless 1 1 0 0  PHQ - 2 Score 3 1 0 0  Altered sleeping 1 1 - -  Tired, decreased energy 1 1 - -  Change in appetite 2 2 - -  Feeling bad or failure about yourself   0 0 - -  Trouble concentrating 0 0 - -  Moving slowly or fidgety/restless 0 1 - -  Suicidal thoughts 0 0 - -  PHQ-9 Score 7 6 - -   GAD 7 : Generalized Anxiety Score 11/23/2016 08/18/2016  Nervous, Anxious, on Edge 1 1  Control/stop worrying 1 1  Worry too much - different things 1 1  Trouble relaxing 1 1  Restless 0 1  Easily annoyed or irritable 0 1  Afraid - awful might happen 0 0  Total GAD 7 Score 4 6

## 2017-09-17 ENCOUNTER — Encounter: Payer: Medicaid Other | Admitting: Obstetrics and Gynecology

## 2017-09-23 ENCOUNTER — Ambulatory Visit (INDEPENDENT_AMBULATORY_CARE_PROVIDER_SITE_OTHER): Payer: Medicaid Other

## 2017-09-23 VITALS — BP 122/66 | HR 99

## 2017-09-23 DIAGNOSIS — R51 Headache: Secondary | ICD-10-CM

## 2017-09-23 DIAGNOSIS — R519 Headache, unspecified: Secondary | ICD-10-CM

## 2017-09-23 DIAGNOSIS — Z0130 Encounter for examination of blood pressure without abnormal findings: Secondary | ICD-10-CM

## 2017-09-23 DIAGNOSIS — R112 Nausea with vomiting, unspecified: Secondary | ICD-10-CM

## 2017-09-23 MED ORDER — ONDANSETRON 4 MG PO TBDP: 4.0000 mg | ORAL_TABLET | Freq: Three times a day (TID) | ORAL | 0 refills | Status: DC | PRN

## 2017-09-23 MED ORDER — CYCLOBENZAPRINE HCL 5 MG PO TABS: 5.0000 mg | ORAL_TABLET | Freq: Three times a day (TID) | ORAL | 0 refills | Status: DC | PRN

## 2017-09-23 NOTE — Progress Notes (Signed)
Pt call transferred from the front office.  Pt asked if her BP is high if her feet are swelling.  Looking at pt's hx pt has CHTN and is prescribed Labetolol 100 mg po bid.  I verified with pt if she is taking the Labetalol pt stated that she stopped taking the medication a little over a week ago because it was giving her headaches.  I explained to the pt that her not taking her BP medication her BP may be elevated especially since she is complaining of the headaches that she was getting.  I informed pt that the headaches could be just to her not taking her BP medication which would lower her BP which would then help possible alleviate the headaches.  I strongly advised pt to start taking medication asap and to come in for BP check.  Pt stated that she would be able come in today for BP check between 1400 and 1430.  Notified front office of need for appt.

## 2017-09-23 NOTE — Progress Notes (Signed)
Pt here today for BP check.  BP 122/66.  Pt reports that she is currently having a headache and they are coming consistently.  I strongly advised pt to start taking her Labetalol asap even though BP is good right now.    Notified Dr. Adrian BlackwaterStinson,  provider recommended that she can take Flexeril 5-10mg  po tid PRN for headaches and that she can have a refill on her Zofran.  Medications e-prescribed as permitted.  Pt stated thank you and that we would see her at NEW OB appt on 10/14/17.

## 2017-09-23 NOTE — Progress Notes (Signed)
Chart reviewed - agree with RN documentation.   

## 2017-10-06 ENCOUNTER — Inpatient Hospital Stay (HOSPITAL_COMMUNITY)
Admission: AD | Admit: 2017-10-06 | Discharge: 2017-10-06 | Disposition: A | Discharge status: Home / Self Care | Payer: Medicaid Other | Source: Ambulatory Visit | Attending: Obstetrics and Gynecology | Admitting: Obstetrics and Gynecology

## 2017-10-06 ENCOUNTER — Encounter (HOSPITAL_COMMUNITY): Payer: Self-pay | Admitting: *Deleted

## 2017-10-06 DIAGNOSIS — O9A212 Injury, poisoning and certain other consequences of external causes complicating pregnancy, second trimester: Secondary | ICD-10-CM

## 2017-10-06 DIAGNOSIS — M79659 Pain in unspecified thigh: Secondary | ICD-10-CM | POA: Diagnosis not present

## 2017-10-06 DIAGNOSIS — W010XXA Fall on same level from slipping, tripping and stumbling without subsequent striking against object, initial encounter: Secondary | ICD-10-CM | POA: Insufficient documentation

## 2017-10-06 DIAGNOSIS — O26892 Other specified pregnancy related conditions, second trimester: Secondary | ICD-10-CM | POA: Diagnosis not present

## 2017-10-06 DIAGNOSIS — Z3A20 20 weeks gestation of pregnancy: Secondary | ICD-10-CM | POA: Insufficient documentation

## 2017-10-06 LAB — URINALYSIS, ROUTINE W REFLEX MICROSCOPIC
Bilirubin Urine: NEGATIVE
Glucose, UA: NEGATIVE mg/dL
Hgb urine dipstick: NEGATIVE
KETONES UR: NEGATIVE mg/dL
LEUKOCYTES UA: NEGATIVE
NITRITE: NEGATIVE
PH: 7 (ref 5.0–8.0)
Protein, ur: NEGATIVE mg/dL
SPECIFIC GRAVITY, URINE: 1.017 (ref 1.005–1.030)

## 2017-10-06 MED ORDER — CYCLOBENZAPRINE HCL 10 MG PO TABS
10.0000 mg | ORAL_TABLET | Freq: Once | ORAL | Status: AC
  Administered 2017-10-06: 10 mg via ORAL
  Filled 2017-10-06: qty 1

## 2017-10-06 NOTE — MAU Provider Note (Signed)
History     CSN: [id number]  Arrival date and time: 10/06/17 1644   First Provider Initiated Contact with Patient 10/06/17 1722      Chief Complaint  Patient presents with  . Fall   HPI Monique Gamble is 27 y.o. [id number] 4432w2d weeks presenting with report of tripped walking up the steps.  Fell back 2 steps, hitting her right upper arm and landing on her buttocks last night..  Denies pain in upper extremities and hips/thighs.  Reports that her legs "feel like bricks" are on them.  Spotted after the fall but has not seen any further bleeding.  Neg loss of fluid.  Has not felt fetal movement with this pregnancy.  She is a patient in the clinic.  Had Rx for muscle relaxant at home, took one this morning.  It did not help leg pain.     Past Medical History:  Diagnosis Date  . ADHD (attention deficit hyperactivity disorder)   . Chlamydia   . Headache   . History of anemia   . Hypertension   . Pregnancy induced hypertension     Past Surgical History:  Procedure Laterality Date  . INDUCED ABORTION      Family History  Problem Relation Age of Onset  . Hypertension Mother   . Diabetes Sister   . Anesthesia problems Neg Hx     Social History   Tobacco Use  . Smoking status: Former Smoker    Types: Cigars    Last attempt to quit: 07/05/2008    Years since quitting: 9.2  . Smokeless tobacco: Never Used  Substance Use Topics  . Alcohol use: No  . Drug use: Yes    Types: Marijuana    Comment: last use 03 Jul 2017    Allergies: No Known Allergies  Medications Prior to Admission  Medication Sig Dispense Refill Last Dose  . cephALEXin (KEFLEX) 500 MG capsule Take 1 capsule (500 mg total) by mouth 4 (four) times daily. 28 capsule 0   . cyclobenzaprine (FLEXERIL) 5 MG tablet Take 1 tablet (5 mg total) by mouth 3 (three) times daily as needed for muscle spasms. 30 tablet 0   . labetalol (NORMODYNE) 100 MG tablet Take 1 tablet (100 mg total) by mouth 2 (two) times daily. 60  tablet 2   . ondansetron (ZOFRAN ODT) 4 MG disintegrating tablet Take 1 tablet (4 mg total) by mouth every 8 (eight) hours as needed for nausea or vomiting. 30 tablet 0   . promethazine (PHENERGAN) 25 MG tablet Take 1 tablet (25 mg total) by mouth every 6 (six) hours as needed for nausea or vomiting. 30 tablet 3     Review of Systems  Constitutional: Negative for activity change (sore to walk).  Respiratory: Negative for shortness of breath.   Cardiovascular: Negative for chest pain.  Gastrointestinal: Negative for abdominal pain.  Genitourinary: Negative for pelvic pain and vaginal bleeding.       Loss of fluid  Musculoskeletal: Positive for myalgias (bilateral leg pain). Negative for back pain.   Physical Exam   Blood pressure 135/70, pulse 95, temperature 98.3 F (36.8 C), temperature source Oral, resp. rate 18, weight 79.2 kg, last menstrual period 05/17/2017, not currently breastfeeding.  Physical Exam  Nursing note and vitals reviewed. Constitutional: She is oriented to person, place, and time. She appears well-developed and well-nourished. No distress.  HENT:  Head: Normocephalic.  GI: There is no tenderness.  Genitourinary: There is no rash, tenderness or lesion on  the right labia. There is no rash, tenderness or lesion on the left labia. Uterus is enlarged (fundal height at umbilicus). No erythema, tenderness or bleeding in the vagina.  Musculoskeletal: She exhibits tenderness (bilateral upper thighs ). She exhibits no edema.  Able to walk and bend   Neurological: She is alert and oriented to person, place, and time.  Skin: Skin is warm and dry.  Psychiatric: Her behavior is normal. Thought content normal.   Results for orders placed or performed during the hospital encounter of 10/06/17 (from the past 24 hour(s))  Urinalysis, Routine w reflex microscopic     Status: Abnormal   Collection Time: 10/06/17  5:25 PM  Result Value Ref Range   Color, Urine YELLOW YELLOW    APPearance HAZY (A) CLEAR   Specific Gravity, Urine 1.017 1.005 - 1.030   pH 7.0 5.0 - 8.0   Glucose, UA NEGATIVE NEGATIVE mg/dL   Hgb urine dipstick NEGATIVE NEGATIVE   Bilirubin Urine NEGATIVE NEGATIVE   Ketones, ur NEGATIVE NEGATIVE mg/dL   Protein, ur NEGATIVE NEGATIVE mg/dL   Nitrite NEGATIVE NEGATIVE   Leukocytes, UA NEGATIVE NEGATIVE   MAU Course  Procedures  MDM MSE Exam Doppler--FHR 140 Flexeril 10mg  po given in MAU  Feeling a little better after medication. Has Rx for Flexeril at home   Assessment and Plan  A:  Fall up steps --upper thigh pain bilaterally       Pregnancy-5873w2d       Fetal Heart Rate 140  P:  Continue Rx for Flexeril she has at home--can take 2 tabs po bid tomorrow and then back to Rx dosing on the bottle.        Discussed that she may be more sore the following day-nature of injuries      Call for concerns       Keep scheduled appt in the clinic for next week.   Dennison Mascotve M Quintarius Ferns 10/06/2017, 5:23 PM

## 2017-10-06 NOTE — Discharge Instructions (Signed)
Preventing Injuries During Pregnancy Injuries can happen during pregnancy. Minor falls and accidents usually do not harm you or your baby. But some injuries can harm you and your baby. Tell your doctor about any injury you suffer. What can I do to avoid injuries? Safety  Remove rugs and loose objects on the floor.  Wear comfortable shoes that have a good grip. Do not wear shoes that have high heels.  Always wear your seat belt in the car. The lap belt should be below your belly. Always drive safely.  Do not ride on a motorcycle. Activity  Do not take part in rough and violent activities or sports.  Avoid: ? Walking on wet or slippery floors. ? Lifting heavy pots of boiling or hot liquids. ? Fixing electrical problems. ? Being near fires. General instructions  Take over-the-counter and prescription medicines only as told by your doctor.  Know your blood type and the blood type of the baby's father.  If you are a victim of domestic violence: ? Call your local emergency services (911 in the [place] ? Contact the National Domestic Violence Hotline for help and support. Get help right away if:  You fall on your belly or receive any serious blow to your belly.  You have a stiff neck or neck pain after a fall or an injury.  You get a headache or have problems with vision after an injury.  You do not feel the baby move or the baby is not moving as much as normal.  You have been a victim of domestic violence or any other kind of attack.  You have been in a car accident.  You have bleeding from your vagina.  Fluid is leaking from your vagina.  You start to have cramping or pain in your belly (contractions).  You have very bad pain in your lower back.  You feel weak or pass out (faint).  You start to throw up (vomit) after an injury.  You have been burned. Summary  Some injuries that happen during pregnancy can do harm to the baby.  Tell your doctor about any  injury.  Take steps to avoid injury. This includes removing rugs and loose objects on the floor. Always wear your seat belt in the car.  Do not take part in rough and violent activities or sports.  Get help right away if you have any serious accident or injury. This information is not intended to replace advice given to you by your health care provider. Make sure you discuss any questions you have with your health care provider. Document Released: 03/07/2010 Document Revised: 02/12/2016 Document Reviewed: 02/12/2016 Elsevier Interactive Patient Education  2017 [employer]  

## 2017-10-06 NOTE — MAU Note (Addendum)
Patient was walking up steps and tripped and fell back (down 2 steps) and hit right upper arm and then landed on buttocks.  Now c/o pain on her right arm and bilateral hips/thighs.  "It feels like bricks on my legs." Denies vaginal bleeding or LOF.

## 2017-10-14 ENCOUNTER — Encounter: Payer: Medicaid Other | Admitting: Obstetrics and Gynecology

## 2017-10-14 NOTE — BH Specialist Note (Deleted)
Integrated Behavioral Health Initial Visit  MRN: [id number] Name: Monique Gamble  Number of Integrated Behavioral Health Clinician visits:: 1/6 Session Start time: ***  Session End time: *** Total time: {IBH Total Time:21014050}  Type of Service: Integrated Behavioral Health- Individual/Family Interpretor:No. Interpretor Name and Language: n/a   Warm Hand Off Completed.       SUBJECTIVE: Monique Gamble is a 27 y.o. female accompanied by {CHL AMB ACCOMPANIED MW:[id number]}BY:[phone]} Patient was referred by Nettie ElmMichael Ervin, MD for Initial OB introduction to integrated behavioral health services ***. Patient reports the following symptoms/concerns: *** Duration of problem: ***; Severity of problem: {Mild/Moderate/Severe:20260}  OBJECTIVE: Mood: {BHH MOOD:22306} and Affect: {BHH AFFECT:22307} Risk of harm to self or others: {CHL AMB BH Suicide Current Mental Status:21022748}  LIFE CONTEXT: Family and Social: *** School/Work: *** Self-Care: *** Life Changes: Current pregnancy ***  GOALS ADDRESSED: Patient will: 1. Reduce symptoms of: {IBH Symptoms:21014056} 2. Increase knowledge and/or ability of: {IBH Patient Tools:21014057}  3. Demonstrate ability to: {IBH Goals:21014053}  INTERVENTIONS: Interventions utilized: {IBH Interventions:21014054}  Standardized Assessments completed: GAD-7 and PHQ 9 ***  ASSESSMENT: Patient currently experiencing Supervision of *** pregnancy, antepartum ***.   Patient may benefit from Initial OB introduction to integrated behavioral health services .  PLAN: 1. Follow up with behavioral health clinician on : *** 2. Behavioral recommendations: *** 3. Referral(s): {IBH Referrals:21014055} 4. "From scale of 1-10, how likely are you to follow plan?": ***  Rae LipsJamie C McMannes, LCSW  [facility] 2[facility] 11/23/2016 08/18/2016 01/15/2015 02/14/2014  Decreased Interest 2 0 0 0  Down, Depressed, Hopeless 1 1 0 0  PHQ - 2 Score 3 1 0 0  Altered  sleeping 1 1 - -  Tired, decreased energy 1 1 - -  Change in appetite 2 2 - -  Feeling bad or failure about yourself  0 0 - -  Trouble concentrating 0 0 - -  Moving slowly or fidgety/restless 0 1 - -  Suicidal thoughts 0 0 - -  PHQ-9 Score 7 6 - -   GAD 7 : Generalized Anxiety Score 11/23/2016 08/18/2016  Nervous, Anxious, on Edge 1 1  Control/stop worrying 1 1  Worry too much - different things 1 1  Trouble relaxing 1 1  Restless 0 1  Easily annoyed or irritable 0 1  Afraid - awful might happen 0 0  Total GAD 7 Score 4 6

## 2017-10-28 ENCOUNTER — Ambulatory Visit (INDEPENDENT_AMBULATORY_CARE_PROVIDER_SITE_OTHER): Payer: Medicaid Other | Admitting: Family Medicine

## 2017-10-28 ENCOUNTER — Encounter: Payer: Self-pay | Admitting: Family Medicine

## 2017-10-28 ENCOUNTER — Ambulatory Visit: Payer: Self-pay | Admitting: Clinical

## 2017-10-28 ENCOUNTER — Other Ambulatory Visit (HOSPITAL_COMMUNITY)
Admission: RE | Admit: 2017-10-28 | Discharge: 2017-10-28 | Disposition: A | Discharge status: Home / Self Care | Payer: Medicaid Other | Source: Ambulatory Visit | Attending: Family Medicine | Admitting: Family Medicine

## 2017-10-28 VITALS — BP 128/65 | HR 108 | Wt 174.4 lb

## 2017-10-28 DIAGNOSIS — Z3A00 Weeks of gestation of pregnancy not specified: Secondary | ICD-10-CM | POA: Diagnosis not present

## 2017-10-28 DIAGNOSIS — N898 Other specified noninflammatory disorders of vagina: Secondary | ICD-10-CM | POA: Diagnosis not present

## 2017-10-28 DIAGNOSIS — O0990 Supervision of high risk pregnancy, unspecified, unspecified trimester: Secondary | ICD-10-CM | POA: Insufficient documentation

## 2017-10-28 DIAGNOSIS — O10919 Unspecified pre-existing hypertension complicating pregnancy, unspecified trimester: Secondary | ICD-10-CM

## 2017-10-28 DIAGNOSIS — O10912 Unspecified pre-existing hypertension complicating pregnancy, second trimester: Secondary | ICD-10-CM

## 2017-10-28 DIAGNOSIS — R51 Headache: Secondary | ICD-10-CM | POA: Diagnosis not present

## 2017-10-28 DIAGNOSIS — Z3482 Encounter for supervision of other normal pregnancy, second trimester: Secondary | ICD-10-CM | POA: Diagnosis not present

## 2017-10-28 DIAGNOSIS — R519 Headache, unspecified: Secondary | ICD-10-CM

## 2017-10-28 DIAGNOSIS — O0992 Supervision of high risk pregnancy, unspecified, second trimester: Secondary | ICD-10-CM

## 2017-10-28 DIAGNOSIS — R112 Nausea with vomiting, unspecified: Secondary | ICD-10-CM

## 2017-10-28 MED ORDER — SULFAMETHOXAZOLE-TRIMETHOPRIM 800-160 MG PO TABS: 1.0000 | ORAL_TABLET | Freq: Two times a day (BID) | ORAL | 1 refills | Status: DC

## 2017-10-28 MED ORDER — ASPIRIN EC 81 MG PO TBEC: 81.0000 mg | DELAYED_RELEASE_TABLET | Freq: Every day | ORAL | 2 refills | Status: DC

## 2017-10-28 MED ORDER — CYCLOBENZAPRINE HCL 10 MG PO TABS: 10.0000 mg | ORAL_TABLET | Freq: Three times a day (TID) | ORAL | 3 refills | Status: DC | PRN

## 2017-10-28 MED ORDER — PROMETHAZINE HCL 25 MG PO TABS: 25.0000 mg | ORAL_TABLET | Freq: Four times a day (QID) | ORAL | 3 refills | Status: DC | PRN

## 2017-10-28 NOTE — BH Specialist Note (Signed)
Integrated Behavioral Health Initial Visit  MRN: [id number] Name: Monique Gamble  Number of Integrated Behavioral Health Clinician visits:: 1/6 Session Start time: 2:52  Session End time: 3:01 Total time: 15 minutes  Type of Service: Integrated Behavioral Health- Individual/Family Interpretor:No. Interpretor Name and Language: n/a   Warm Hand Off Completed.       SUBJECTIVE: Monique Gamble is a 27 y.o. female accompanied by n/a Patient was referred by Candelaria CelesteJacob Stinson, DO for Initial OB introduction to integrated behavioral health services . Patient reports the following symptoms/concerns: Pt states she has no particular concerns today.  Duration of problem: n/a; Severity of problem: n/a  OBJECTIVE: Mood: Normal and Affect: Appropriate Risk of harm to self or others: No plan to harm self or others  LIFE CONTEXT: Family and Social: Pt lives with her 5 children(10,6,3,2,1) School/Work: - Self-Care: - Life Changes: Current pregnancy    INTERVENTIONS:   Standardized Assessments completed: GAD-7 and PHQ 9  ASSESSMENT: Patient currently experiencing Supervision of high risk  pregnancy, antepartum.   Patient may benefit from Initial OB introduction to integrated behavioral health services .  PLAN: 1. Follow up with behavioral health clinician on : As needed 2. Behavioral recommendations:  -Begin taking prenatal vitamin, as recommended by medical provider 3. Referral(s): Integrated Behavioral Health Services (In Clinic) 4. "From scale of 1-10, how likely are you to follow plan?": 10  Rae LipsJamie C Webber Michiels, LCSW  [facility] 10/28/2017 11/23/2016 08/18/2016 01/15/2015 02/14/2014  Decreased Interest 0 2 0 0 0  Down, Depressed, Hopeless 1 1 1  0 0  PHQ - 2 Score 1 3 1  0 0  Altered sleeping 2 1 1  - -  Tired, decreased energy 0 1 1 - -  Change in appetite 0 2 2 - -  Feeling bad or failure about yourself  0 0 0 - -  Trouble concentrating 0 0 0 - -  Moving  slowly or fidgety/restless 0 0 1 - -  Suicidal thoughts 0 0 0 - -  PHQ-9 Score 3 7 6  - -   GAD 7 : Generalized Anxiety Score 10/28/2017 11/23/2016 08/18/2016  Nervous, Anxious, on Edge 0 1 1  Control/stop worrying 1 1 1   Worry too much - different things 1 1 1   Trouble relaxing 0 1 1  Restless 0 0 1  Easily annoyed or irritable 1 0 1  Afraid - awful might happen 0 0 0  Total GAD 7 Score 3 4 6

## 2017-10-28 NOTE — Addendum Note (Signed)
Addended by: Kathee DeltonHILLMAN, Saxon Barich L on: 10/28/2017 02:36 PM   Modules accepted: Orders

## 2017-10-28 NOTE — Addendum Note (Signed)
Addended by: Kathee DeltonHILLMAN, Tayshun Gappa L on: 10/28/2017 02:47 PM   Modules accepted: Orders

## 2017-10-28 NOTE — Progress Notes (Signed)
Subjective:  Monique Gamble is a [id number] [redacted]w[redacted]d being seen today for her first obstetrical visit.  Her obstetrical history is significant for pre-eclampsia and CHTN. Patient does intend to breast feed. Pregnancy history fully reviewed.  Patient reports headache and nausea.  BP 128/65   Pulse (!) 108   Wt 174 lb 6.4 oz (79.1 kg)   LMP 05/17/2017 (Approximate) Comment: pt had a miscarriage in middle March  BMI 28.15 kg/m   HISTORY: OB History  Gravida Para Term Preterm AB Living  8 5 5  0 2 5  SAB TAB Ectopic Multiple Live Births  1 1 0 0 5    # Outcome Date GA Lbr Len/2nd Weight Sex Delivery Anes PTL Lv  8 Current           7 SAB 04/2017          6 Term 08/22/16 797w5d 429:40 / 00:07 6 lb 13.7 oz (3.11 kg) M Vag-Spont None  LIV  5 Term 01/21/15 479w3d 02:38 / 00:03 6 lb 3 oz (2.807 kg) M Vag-Spont None  LIV  4 Term 03/15/14 6271w1d 03:15 / 00:02 7 lb 0.7 oz (3.195 kg) M Vag-Spont None  LIV  3 Term 08/15/11 271w4d 01:38 / 00:14 6 lb 12.1 oz (3.065 kg) F Vag-Spont None  LIV  2 Term 2009 4175w0d  6 lb 9 oz (2.977 kg) F Vag-Spont   LIV  1 TAB 2005            Past Medical History:  Diagnosis Date  . ADHD (attention deficit hyperactivity disorder)   . Chlamydia   . Headache   . History of anemia   . Hypertension   . Pregnancy induced hypertension     Past Surgical History:  Procedure Laterality Date  . INDUCED ABORTION      Family History  Problem Relation Age of Onset  . Hypertension Mother   . Hypertension Father   . Diabetes Sister   . Anesthesia problems Neg Hx      Exam    Uterus:     Pelvic Exam:    Perineum: No Hemorrhoids, Normal Perineum   Vulva: normal, Bartholin's, Urethra, Skene's normal   Vagina:  normal mucosa   Cervix: multiparous appearance and no cervical motion tenderness   Adnexa: normal adnexa and no mass, fullness, tenderness   Bony Pelvis: gynecoid  System: Breast:  Pt declined exam   Skin: normal coloration and turgor, no rashes    Neurologic: oriented, gait normal; reflexes normal and symmetric   Extremities: normal strength, tone, and muscle mass   HEENT PERRLA and extra ocular movement intact   Mouth/Teeth mucous membranes moist, pharynx normal without lesions   Neck supple and no masses   Cardiovascular: regular rate and rhythm, no murmurs or gallops   Respiratory:  appears well, vitals normal, no respiratory distress, acyanotic, normal RR, ear and throat exam is normal, neck free of mass or lymphadenopathy, chest clear, no wheezing, crepitations, rhonchi, normal symmetric air entry   Abdomen: soft, non-tender; bowel sounds normal; no masses,  no organomegaly   Urinary: urethral meatus normal      Assessment:    Pregnancy: [id number] Patient Active Problem List   Diagnosis Date Noted  . No prenatal care in current pregnancy 08/22/2016  . Preeclampsia in postpartum period 01/30/2015  . Supervision of high-risk pregnancy 12/19/2014      Plan:   1. Supervision of high risk pregnancy, antepartum FHT and FH normal - Culture, OB Urine -  Hemoglobinopathy Evaluation - Obstetric Panel, Including HIV - [place] MFM OB DETAIL +14 WK; Future - Comprehensive metabolic panel - Protein / creatinine ratio, urine  2. Chronic hypertension during pregnancy, antepartum Start ASA 81mg  - [place] MFM OB DETAIL +14 WK; Future - Comprehensive metabolic panel - Protein / creatinine ratio, urine  3. Frequent headaches - cyclobenzaprine (FLEXERIL) 10 MG tablet; Take 1 tablet (10 mg total) by mouth 3 (three) times daily as needed for muscle spasms.  Dispense: 30 tablet; Refill: 3  4. Nausea and vomiting in adult Continue phenergan     Problem list reviewed and updated. 75% of 30 min visit spent on counseling and coordination of care.     Levie Heritage 10/28/2017

## 2017-10-29 ENCOUNTER — Encounter: Payer: Self-pay | Admitting: Family Medicine

## 2017-10-29 LAB — PROTEIN / CREATININE RATIO, URINE
CREATININE, UR: 293.9 mg/dL
PROTEIN UR: 86.3 mg/dL
PROTEIN/CREAT RATIO: 294 mg/g{creat} — AB (ref 0–200)

## 2017-10-29 LAB — CERVICOVAGINAL ANCILLARY ONLY
BACTERIAL VAGINITIS: POSITIVE — AB
CANDIDA VAGINITIS: NEGATIVE
Chlamydia: NEGATIVE
Neisseria Gonorrhea: NEGATIVE
TRICH (WINDOWPATH): NEGATIVE

## 2017-10-31 LAB — URINE CULTURE, OB REFLEX

## 2017-10-31 LAB — CULTURE, OB URINE

## 2017-11-01 ENCOUNTER — Ambulatory Visit (HOSPITAL_COMMUNITY)
Admission: RE | Admit: 2017-11-01 | Discharge: 2017-11-01 | Disposition: A | Discharge status: Home / Self Care | Payer: Medicaid Other | Source: Ambulatory Visit | Attending: Family Medicine | Admitting: Family Medicine

## 2017-11-01 ENCOUNTER — Encounter (HOSPITAL_COMMUNITY): Payer: Self-pay

## 2017-11-01 DIAGNOSIS — Z79899 Other long term (current) drug therapy: Secondary | ICD-10-CM | POA: Diagnosis not present

## 2017-11-01 DIAGNOSIS — Z3A24 24 weeks gestation of pregnancy: Secondary | ICD-10-CM | POA: Diagnosis not present

## 2017-11-01 DIAGNOSIS — O0932 Supervision of pregnancy with insufficient antenatal care, second trimester: Secondary | ICD-10-CM | POA: Diagnosis not present

## 2017-11-01 DIAGNOSIS — O09292 Supervision of pregnancy with other poor reproductive or obstetric history, second trimester: Secondary | ICD-10-CM | POA: Diagnosis not present

## 2017-11-01 DIAGNOSIS — O10012 Pre-existing essential hypertension complicating pregnancy, second trimester: Secondary | ICD-10-CM

## 2017-11-01 DIAGNOSIS — O10919 Unspecified pre-existing hypertension complicating pregnancy, unspecified trimester: Secondary | ICD-10-CM

## 2017-11-01 DIAGNOSIS — O321XX0 Maternal care for breech presentation, not applicable or unspecified: Secondary | ICD-10-CM | POA: Insufficient documentation

## 2017-11-01 DIAGNOSIS — O0990 Supervision of high risk pregnancy, unspecified, unspecified trimester: Secondary | ICD-10-CM

## 2017-11-01 DIAGNOSIS — O0992 Supervision of high risk pregnancy, unspecified, second trimester: Secondary | ICD-10-CM | POA: Insufficient documentation

## 2017-11-01 DIAGNOSIS — Z363 Encounter for antenatal screening for malformations: Secondary | ICD-10-CM | POA: Diagnosis not present

## 2017-11-02 ENCOUNTER — Other Ambulatory Visit (HOSPITAL_COMMUNITY): Payer: Self-pay | Admitting: *Deleted

## 2017-11-02 ENCOUNTER — Other Ambulatory Visit: Payer: Self-pay | Admitting: Family Medicine

## 2017-11-02 DIAGNOSIS — O10919 Unspecified pre-existing hypertension complicating pregnancy, unspecified trimester: Secondary | ICD-10-CM

## 2017-11-02 MED ORDER — METRONIDAZOLE 500 MG PO TABS: 500.0000 mg | ORAL_TABLET | Freq: Two times a day (BID) | ORAL | 0 refills | Status: DC

## 2017-11-03 ENCOUNTER — Telehealth: Payer: Self-pay | Admitting: *Deleted

## 2017-11-03 NOTE — Telephone Encounter (Signed)
Called pt and informed her of abnormal test results showing +BV and +UTI. Pt's questions relating to these diagnoses were answered and she was advised of prescriptions sent to her pharmacy. Pt also had questions about her recent US and those were answered as well.  Pt voiced understanding of all information and instructions given.

## 2017-11-04 ENCOUNTER — Encounter: Payer: Self-pay | Admitting: Family Medicine

## 2017-11-04 ENCOUNTER — Other Ambulatory Visit: Payer: Self-pay | Admitting: Family Medicine

## 2017-11-04 DIAGNOSIS — O99019 Anemia complicating pregnancy, unspecified trimester: Secondary | ICD-10-CM | POA: Insufficient documentation

## 2017-11-04 LAB — COMPREHENSIVE METABOLIC PANEL
A/G RATIO: 1.2 (ref 1.2–2.2)
ALT: 4 IU/L (ref 0–32)
AST: 9 IU/L (ref 0–40)
Albumin: 3.4 g/dL — ABNORMAL LOW (ref 3.5–5.5)
Alkaline Phosphatase: 83 IU/L (ref 39–117)
BILIRUBIN TOTAL: 0.6 mg/dL (ref 0.0–1.2)
BUN/Creatinine Ratio: 7 — ABNORMAL LOW (ref 9–23)
BUN: 4 mg/dL — ABNORMAL LOW (ref 6–20)
CALCIUM: 8.7 mg/dL (ref 8.7–10.2)
CHLORIDE: 104 mmol/L (ref 96–106)
CO2: 20 mmol/L (ref 20–29)
Creatinine, Ser: 0.55 mg/dL — ABNORMAL LOW (ref 0.57–1.00)
GFR, EST AFRICAN AMERICAN: 147 mL/min/{1.73_m2} (ref >59)
GFR, EST NON AFRICAN AMERICAN: 127 mL/min/{1.73_m2} (ref >59)
GLOBULIN, TOTAL: 2.9 g/dL (ref 1.5–4.5)
Glucose: 82 mg/dL (ref 65–99)
POTASSIUM: 3.5 mmol/L (ref 3.5–5.2)
SODIUM: 138 mmol/L (ref 134–144)
Total Protein: 6.3 g/dL (ref 6.0–8.5)

## 2017-11-04 LAB — OBSTETRIC PANEL, INCLUDING HIV
ANTIBODY SCREEN: NEGATIVE
BASOS: 0 %
Basophils Absolute: 0 10*3/uL (ref 0.0–0.2)
EOS (ABSOLUTE): 0.1 10*3/uL (ref 0.0–0.4)
EOS: 1 %
HEMATOCRIT: 27.8 % — AB (ref 34.0–46.6)
HEP B S AG: NEGATIVE
HIV Screen 4th Generation wRfx: NONREACTIVE
Hemoglobin: 8.6 g/dL — ABNORMAL LOW (ref 11.1–15.9)
Immature Grans (Abs): 0.1 10*3/uL (ref 0.0–0.1)
Immature Granulocytes: 1 %
LYMPHS ABS: 1.6 10*3/uL (ref 0.7–3.1)
Lymphs: 17 %
MCH: 24.8 pg — AB (ref 26.6–33.0)
MCHC: 30.9 g/dL — ABNORMAL LOW (ref 31.5–35.7)
MCV: 80 fL (ref 79–97)
Monocytes Absolute: 0.7 10*3/uL (ref 0.1–0.9)
Monocytes: 7 %
NEUTROS ABS: 7.3 10*3/uL — AB (ref 1.4–7.0)
Neutrophils: 74 %
Platelets: 240 10*3/uL (ref 150–450)
RBC: 3.47 x10E6/uL — ABNORMAL LOW (ref 3.77–5.28)
RDW: 13.5 % (ref 12.3–15.4)
RH TYPE: POSITIVE
RPR: NONREACTIVE
Rubella Antibodies, IGG: <0.9 index — ABNORMAL LOW (ref >0.99)
WBC: 9.8 10*3/uL (ref 3.4–10.8)

## 2017-11-04 LAB — HEMOGLOBINOPATHY EVALUATION
Ferritin: 9 ng/mL — ABNORMAL LOW (ref 15–150)
HGB A: 97.7 % (ref 96.4–98.8)
HGB F QUANT: 0 % (ref 0.0–2.0)
HGB S: 0 %
HGB SOLUBILITY: NEGATIVE
HGB VARIANT: 0 %
Hgb A2 Quant: 2.3 % (ref 1.8–3.2)
Hgb C: 0 %

## 2017-11-04 LAB — SMN1 COPY NUMBER ANALYSIS (SMA CARRIER SCREENING)

## 2017-11-05 ENCOUNTER — Telehealth: Payer: Self-pay | Admitting: *Deleted

## 2017-11-05 NOTE — Telephone Encounter (Signed)
Left message with short stay to schedule feraheme infusion.

## 2017-11-05 NOTE — Telephone Encounter (Signed)
-----   Message from Levie HeritageJacob J Stinson, DO sent at 11/04/2017  4:57 PM EDT ----- Patient anemic. Orders placed for feraheme - please schedule

## 2017-11-08 ENCOUNTER — Other Ambulatory Visit: Payer: Self-pay | Admitting: Family Medicine

## 2017-11-08 ENCOUNTER — Encounter: Payer: Self-pay | Admitting: *Deleted

## 2017-11-08 NOTE — Telephone Encounter (Signed)
Per Dr Adrian BlackwaterStinson, patient needs 3 doses of feraheme

## 2017-11-08 NOTE — Telephone Encounter (Signed)
Informed pt of appointment for feraheme infusion that is scheduled for 9/27 @ 9am.  [facility]  Advised her that they would scheduled the next 2 infusions after her appointment.  Pt verbalized understanding.

## 2017-11-10 ENCOUNTER — Encounter: Payer: Self-pay | Admitting: *Deleted

## 2017-11-12 ENCOUNTER — Inpatient Hospital Stay (HOSPITAL_COMMUNITY): Admission: RE | Admit: 2017-11-12 | Payer: Self-pay | Source: Ambulatory Visit

## 2017-11-19 ENCOUNTER — Encounter (HOSPITAL_COMMUNITY): Payer: Self-pay

## 2017-11-23 ENCOUNTER — Other Ambulatory Visit: Payer: Self-pay | Admitting: *Deleted

## 2017-11-23 DIAGNOSIS — O0990 Supervision of high risk pregnancy, unspecified, unspecified trimester: Secondary | ICD-10-CM

## 2017-11-25 ENCOUNTER — Telehealth: Payer: Self-pay | Admitting: Obstetrics & Gynecology

## 2017-11-25 ENCOUNTER — Other Ambulatory Visit: Payer: Self-pay

## 2017-11-25 ENCOUNTER — Encounter: Payer: Self-pay | Admitting: Obstetrics & Gynecology

## 2017-11-25 NOTE — Telephone Encounter (Signed)
Called and left VM for patient to give the office a call back to get her missed [facility] appt rescheduled.

## 2017-11-26 ENCOUNTER — Encounter: Payer: Self-pay | Admitting: Obstetrics & Gynecology

## 2017-11-29 ENCOUNTER — Encounter (HOSPITAL_COMMUNITY): Payer: Self-pay

## 2017-11-29 ENCOUNTER — Ambulatory Visit (HOSPITAL_COMMUNITY)
Admission: RE | Admit: 2017-11-29 | Discharge: 2017-11-29 | Disposition: A | Discharge status: Home / Self Care | Payer: Medicaid Other | Source: Ambulatory Visit | Attending: Family Medicine | Admitting: Family Medicine

## 2017-12-03 ENCOUNTER — Inpatient Hospital Stay (HOSPITAL_COMMUNITY)
Admission: AD | Admit: 2017-12-03 | Discharge: 2017-12-03 | Disposition: A | Discharge status: Home / Self Care | Payer: Medicaid Other | Source: Ambulatory Visit | Attending: Obstetrics and Gynecology | Admitting: Obstetrics and Gynecology

## 2017-12-03 ENCOUNTER — Encounter (HOSPITAL_COMMUNITY): Payer: Self-pay

## 2017-12-03 ENCOUNTER — Other Ambulatory Visit: Payer: Self-pay

## 2017-12-03 DIAGNOSIS — O99013 Anemia complicating pregnancy, third trimester: Secondary | ICD-10-CM | POA: Diagnosis not present

## 2017-12-03 DIAGNOSIS — O10919 Unspecified pre-existing hypertension complicating pregnancy, unspecified trimester: Secondary | ICD-10-CM

## 2017-12-03 DIAGNOSIS — D649 Anemia, unspecified: Secondary | ICD-10-CM | POA: Insufficient documentation

## 2017-12-03 DIAGNOSIS — G4489 Other headache syndrome: Secondary | ICD-10-CM | POA: Diagnosis not present

## 2017-12-03 DIAGNOSIS — R51 Headache: Secondary | ICD-10-CM | POA: Diagnosis not present

## 2017-12-03 DIAGNOSIS — H538 Other visual disturbances: Secondary | ICD-10-CM | POA: Diagnosis not present

## 2017-12-03 DIAGNOSIS — O99019 Anemia complicating pregnancy, unspecified trimester: Secondary | ICD-10-CM

## 2017-12-03 DIAGNOSIS — O163 Unspecified maternal hypertension, third trimester: Secondary | ICD-10-CM | POA: Insufficient documentation

## 2017-12-03 DIAGNOSIS — R42 Dizziness and giddiness: Secondary | ICD-10-CM | POA: Diagnosis not present

## 2017-12-03 DIAGNOSIS — O26893 Other specified pregnancy related conditions, third trimester: Secondary | ICD-10-CM

## 2017-12-03 DIAGNOSIS — Z3A28 28 weeks gestation of pregnancy: Secondary | ICD-10-CM | POA: Insufficient documentation

## 2017-12-03 DIAGNOSIS — Z3689 Encounter for other specified antenatal screening: Secondary | ICD-10-CM

## 2017-12-03 DIAGNOSIS — O10913 Unspecified pre-existing hypertension complicating pregnancy, third trimester: Secondary | ICD-10-CM | POA: Diagnosis not present

## 2017-12-03 LAB — COMPREHENSIVE METABOLIC PANEL
ALT: 7 U/L (ref 0–44)
AST: 17 U/L (ref 15–41)
Albumin: 2.3 g/dL — ABNORMAL LOW (ref 3.5–5.0)
Alkaline Phosphatase: 84 U/L (ref 38–126)
Anion gap: 9 (ref 5–15)
BILIRUBIN TOTAL: 0.8 mg/dL (ref 0.3–1.2)
CALCIUM: 8 mg/dL — AB (ref 8.9–10.3)
CHLORIDE: 104 mmol/L (ref 98–111)
CO2: 21 mmol/L — ABNORMAL LOW (ref 22–32)
CREATININE: 0.48 mg/dL (ref 0.44–1.00)
GFR calc Af Amer: >60 mL/min (ref >60)
Glucose, Bld: 105 mg/dL — ABNORMAL HIGH (ref 70–99)
Potassium: 3.1 mmol/L — ABNORMAL LOW (ref 3.5–5.1)
Sodium: 134 mmol/L — ABNORMAL LOW (ref 135–145)
TOTAL PROTEIN: 6.1 g/dL — AB (ref 6.5–8.1)

## 2017-12-03 LAB — CBC
HEMATOCRIT: 24.5 % — AB (ref 36.0–46.0)
Hemoglobin: 7.7 g/dL — ABNORMAL LOW (ref 12.0–15.0)
MCH: 25.5 pg — ABNORMAL LOW (ref 26.0–34.0)
MCHC: 31.4 g/dL (ref 30.0–36.0)
MCV: 81.1 fL (ref 80.0–100.0)
PLATELETS: 185 10*3/uL (ref 150–400)
RBC: 3.02 MIL/uL — AB (ref 3.87–5.11)
RDW: 14.2 % (ref 11.5–15.5)
WBC: 6.7 10*3/uL (ref 4.0–10.5)
nRBC: 0 % (ref 0.0–0.2)

## 2017-12-03 LAB — PROTEIN / CREATININE RATIO, URINE
CREATININE, URINE: 305 mg/dL
Protein Creatinine Ratio: 0.1 mg/mg{Cre} (ref 0.00–0.15)
TOTAL PROTEIN, URINE: 31 mg/dL

## 2017-12-03 LAB — URINALYSIS, ROUTINE W REFLEX MICROSCOPIC
Bilirubin Urine: NEGATIVE
Glucose, UA: NEGATIVE mg/dL
HGB URINE DIPSTICK: NEGATIVE
Ketones, ur: NEGATIVE mg/dL
Leukocytes, UA: NEGATIVE
Nitrite: NEGATIVE
PROTEIN: 30 mg/dL — AB
SPECIFIC GRAVITY, URINE: 1.02 (ref 1.005–1.030)
pH: 7 (ref 5.0–8.0)

## 2017-12-03 MED ORDER — NIFEDIPINE ER OSMOTIC RELEASE 30 MG PO TB24
30.0000 mg | ORAL_TABLET | Freq: Every day | ORAL | Status: DC
  Administered 2017-12-03: 30 mg via ORAL
  Filled 2017-12-03: qty 1

## 2017-12-03 MED ORDER — IRON POLYSACCH CMPLX-B12-FA 150-0.025-1 MG PO CAPS: 1.0000 | ORAL_CAPSULE | Freq: Every day | ORAL | 2 refills | Status: DC

## 2017-12-03 MED ORDER — SODIUM CHLORIDE 0.9 % IV SOLN
510.0000 mg | Freq: Once | INTRAVENOUS | Status: AC
  Administered 2017-12-03: 510 mg via INTRAVENOUS
  Filled 2017-12-03: qty 17

## 2017-12-03 MED ORDER — BUTALBITAL-APAP-CAFFEINE 50-325-40 MG PO TABS
1.0000 | ORAL_TABLET | ORAL | Status: DC | PRN
  Administered 2017-12-03: 1 via ORAL
  Filled 2017-12-03: qty 1

## 2017-12-03 MED ORDER — BUTALBITAL-APAP-CAFFEINE 50-325-40 MG PO TABS: 1.0000 | ORAL_TABLET | Freq: Four times a day (QID) | ORAL | 0 refills | Status: DC | PRN

## 2017-12-03 MED ORDER — NIFEDIPINE ER 30 MG PO TB24: 30.0000 mg | ORAL_TABLET | Freq: Every day | ORAL | 2 refills | Status: DC

## 2017-12-03 MED ORDER — SODIUM CHLORIDE 0.9 % IV SOLN
INTRAVENOUS | Status: DC | PRN
  Administered 2017-12-03: 1000 mL via INTRAVENOUS

## 2017-12-03 MED ORDER — NIFEDIPINE ER OSMOTIC RELEASE 30 MG PO TB24: 30.0000 mg | ORAL_TABLET | Freq: Every day | ORAL | Status: DC

## 2017-12-03 NOTE — MAU Note (Signed)
Pt states she has been feeling dizzy for about the past hour.  Pt also reports a nagging headache for past hour that she rates a 9/10. Pt states she has not taken her bp meds in 2 days.   Pt states she took 500mg  of tylenol around 6pm with no relief. Pt denies vag bleeding or LOF. Reports good fetal movement.

## 2017-12-03 NOTE — Discharge Instructions (Signed)
Pregnancy and Anemia Anemia is a condition in which the concentration of red blood cells or hemoglobin in the blood is below normal. Hemoglobin is a substance in red blood cells that carries oxygen to the tissues of the body. Anemia results in not enough oxygen reaching these tissues. Anemia during pregnancy is common because the fetus uses more iron and folic acid as it is developing. Your body may not produce enough red blood cells because of this. Also, during pregnancy, the liquid part of the blood (plasma) increases by about 50%, and the red blood cells increase by only 25%. This lowers the concentration of the red blood cells and creates a natural anemia-like situation. What are the causes? The most common cause of anemia during pregnancy is not having enough iron in the body to make red blood cells (iron deficiency anemia). Other causes may include:  Folic acid deficiency.  Vitamin B12 deficiency.  Certain prescription or over-the-counter medicines.  Certain medical conditions or infections that destroy red blood cells.  A low platelet count and bleeding caused by antibodies that go through the placenta to the fetus from the mothers blood.  What are the signs or symptoms? Mild anemia may not be noticeable. If it becomes severe, symptoms may include:  Tiredness.  Shortness of breath, especially with exercise.  Weakness.  Fainting.  Pale looking skin.  Headaches.  Feeling a fast or irregular heartbeat (palpitations).  How is this diagnosed? The type of anemia is usually diagnosed from your family and medical history and blood tests. How is this treated? Treatment of anemia during pregnancy depends on the cause of the anemia. Treatment can include:  Supplements of iron, vitamin B12, or folic acid.  A blood transfusion. This may be needed if blood loss is severe.  Hospitalization. This may be needed if there is significant continual blood loss.  Dietary  changes.  Follow these instructions at home:  Follow your dietitian's or health care provider's dietary recommendations.  Increase your vitamin C intake. This will help the stomach absorb more iron.  Eat a diet rich in iron. This would include foods such as: ? Liver. ? Beef. ? Whole grain bread. ? Eggs. ? Dried fruit.  Take iron and vitamins as directed by your health care provider.  Eat green leafy vegetables. These are a good source of folic acid. Contact a health care provider if:  You have frequent or lasting headaches.  You are looking pale.  You are bruising easily. Get help right away if:  You have extreme weakness, shortness of breath, or chest pain.  You become dizzy or have trouble concentrating.  You have heavy vaginal bleeding.  You develop a rash.  You have bloody or black, tarry stools.  You faint.  You vomit up blood.  You vomit repeatedly.  You have abdominal pain.  You have a fever or persistent symptoms for more than 2-3 days.  You have a fever and your symptoms suddenly get worse.  You are dehydrated. This information is not intended to replace advice given to you by your health care provider. Make sure you discuss any questions you have with your health care provider. Document Released: 01/31/2000 Document Revised: 07/11/2015 Document Reviewed: 09/14/2012 Elsevier Interactive Patient Education  2017 Elsevier Inc.  Hypertension During Pregnancy Hypertension is also called high blood pressure. High blood pressure means that the force of your blood moving in your body is too strong. When you are pregnant, this condition should be watched carefully. It can  cause problems for you and your baby. Follow these instructions at home: Eating and drinking  Drink enough fluid to keep your pee (urine) clear or pale yellow.  Eat healthy foods that are low in salt (sodium). ? Do not add salt to your food. ? Check labels on foods and drinks to see  much salt is in them. Look on the label where you see "Sodium." Lifestyle  Do not use any products that contain nicotine or tobacco, such as cigarettes and e-cigarettes. If you need help quitting, ask your doctor.  Do not use alcohol.  Avoid caffeine.  Avoid stress. Rest and get plenty of sleep. General instructions  Take over-the-counter and prescription medicines only as told by your doctor.  While lying down, lie on your left side. This keeps pressure off your baby.  While sitting or lying down, raise (elevate) your feet. Try putting some pillows under your lower legs.  Exercise regularly. Ask your doctor what kinds of exercise are best for you.  Keep all prenatal and follow-up visits as told by your doctor. This is important. Contact a doctor if:  You have symptoms that your doctor told you to watch for, such as: ? Fever. ? Throwing up (vomiting). ? Headache. Get help right away if:  You have very bad pain in your belly (abdomen).  You are throwing up, and this does not get better with treatment.  You suddenly get swelling in your hands, ankles, or face.  You gain 4 lb (1.8 kg) or more in 1 week.  You get bleeding from your vagina.  You have blood in your pee.  You do not feel your baby moving as much as normal.  You have a change in vision.  You have muscle twitching or sudden tightening (spasms).  You have trouble breathing.  Your lips or fingernails turn blue. This information is not intended to replace advice given to you by your health care provider. Make sure you discuss any questions you have with your health care provider. Document Released: 03/07/2010 Document Revised: 10/15/2015 Document Reviewed: 10/15/2015 Elsevier Interactive Patient Education  [employer].

## 2017-12-03 NOTE — MAU Provider Note (Signed)
History     CSN: [id number]  Arrival date and time: 12/03/17 1851   First Provider Initiated Contact with Patient 12/03/17 1921      Chief Complaint  Patient presents with  . Dizziness  . Headache   [id number] @28 .4 wks here with dizziness and HA. HA has been ongoing for several days. She thinks its from the Labetalol. HA is located frontal. Associated sx are blurry vision. No floaters. No SOB, CP, or RUQ pain. Dizziness happened today a few hrs ago. No syncope. Her pregnancy is complicated by [facility] and anemia. She has not taken the Labetalol in 2 days. Feeling good FM. No VB, LOF, or ctx.   OB History    Gravida  8   Para  5   Term  5   Preterm  0   AB  2   Living  5     SAB  1   TAB  1   Ectopic  0   Multiple  0   Live Births  5           Past Medical History:  Diagnosis Date  . ADHD (attention deficit hyperactivity disorder)   . Chlamydia   . Headache   . History of anemia   . Hypertension   . Pregnancy induced hypertension     Past Surgical History:  Procedure Laterality Date  . INDUCED ABORTION      Family History  Problem Relation Age of Onset  . Hypertension Mother   . Hypertension Father   . Diabetes Sister   . Anesthesia problems Neg Hx     Social History   Tobacco Use  . Smoking status: Never Smoker  . Smokeless tobacco: Never Used  Substance Use Topics  . Alcohol use: No  . Drug use: Not Currently    Types: Marijuana    Comment: last use 03 Jul 2017    Allergies: No Known Allergies  Medications Prior to Admission  Medication Sig Dispense Refill Last Dose  . acetaminophen (TYLENOL) 500 MG tablet Take 500 mg by mouth every 6 (six) hours as needed for mild pain or moderate pain.   Taking  . aspirin EC 81 MG tablet Take 1 tablet (81 mg total) by mouth daily. Take after 12 weeks for prevention of preeclampsia later in pregnancy 300 tablet 2 Taking  . cyclobenzaprine (FLEXERIL) 10 MG tablet Take 1 tablet (10 mg total) by mouth  3 (three) times daily as needed for muscle spasms. 30 tablet 3 Taking  . labetalol (NORMODYNE) 100 MG tablet Take 1 tablet (100 mg total) by mouth 2 (two) times daily. 60 tablet 2 Taking  . metroNIDAZOLE (FLAGYL) 500 MG tablet Take 1 tablet (500 mg total) by mouth 2 (two) times daily. 14 tablet 0   . ondansetron (ZOFRAN ODT) 4 MG disintegrating tablet Take 1 tablet (4 mg total) by mouth every 8 (eight) hours as needed for nausea or vomiting. 30 tablet 0 Taking  . promethazine (PHENERGAN) 25 MG tablet Take 1 tablet (25 mg total) by mouth every 6 (six) hours as needed for nausea or vomiting. 30 tablet 3 Taking  . sulfamethoxazole-trimethoprim (BACTRIM DS,SEPTRA DS) 800-160 MG tablet Take 1 tablet by mouth 2 (two) times daily. 14 tablet 1 Taking    Review of Systems  Gastrointestinal: Negative for abdominal pain.  Genitourinary: Negative for vaginal discharge.  Neurological: Positive for dizziness and headaches. Negative for syncope.   Physical Exam   Blood pressure 136/80, pulse 97, temperature 98.7  F (37.1 C), temperature source Oral, resp. rate 18, last menstrual period 05/17/2017, SpO2 100 %, not currently breastfeeding. Patient Vitals for the past 24 hrs:  BP Temp Temp src Pulse Resp SpO2  12/03/17 1958 136/80 - - - - -  12/03/17 1937 127/80 - - 97 - -  12/03/17 1936 127/80 - - - - -  12/03/17 1934 131/75 - - - - -  12/03/17 1909 124/75 - - - - 100 %  12/03/17 1908 (!) 141/81 98.7 F (37.1 C) Oral 92 18 -    Physical Exam  Nursing note and vitals reviewed. Constitutional: She is oriented to person, place, and time. She appears well-developed and well-nourished. No distress.  HENT:  Head: Normocephalic and atraumatic.  Neck: Normal range of motion.  Respiratory: Effort normal. No respiratory distress.  Musculoskeletal: Normal range of motion.  Neurological: She is alert and oriented to person, place, and time.  Skin: Skin is warm and dry.  Psychiatric: She has a normal mood  and affect.  EFM: 140 bpm, mod variability, + accels, no decels Toco: rare, irritability  Results for orders placed or performed during the hospital encounter of 12/03/17 (from the past 24 hour(s))  CBC     Status: Abnormal   Collection Time: 12/03/17  7:08 PM  Result Value Ref Range   WBC 6.7 4.0 - 10.5 K/uL   RBC 3.02 (L) 3.87 - 5.11 MIL/uL   Hemoglobin 7.7 (L) 12.0 - 15.0 g/dL   HCT 16.1 (L) 09.6 - 04.5 %   MCV 81.1 80.0 - 100.0 fL   MCH 25.5 (L) 26.0 - 34.0 pg   MCHC 31.4 30.0 - 36.0 g/dL   RDW 40.9 81.1 - 91.4 %   Platelets 185 150 - 400 K/uL   nRBC 0.0 0.0 - 0.2 %  Comprehensive metabolic panel     Status: Abnormal   Collection Time: 12/03/17  7:12 PM  Result Value Ref Range   Sodium 134 (L) 135 - 145 mmol/L   Potassium 3.1 (L) 3.5 - 5.1 mmol/L   Chloride 104 98 - 111 mmol/L   CO2 21 (L) 22 - 32 mmol/L   Glucose, Bld 105 (H) 70 - 99 mg/dL   BUN <5 (L) 6 - 20 mg/dL   Creatinine, Ser 7.82 0.44 - 1.00 mg/dL   Calcium 8.0 (L) 8.9 - 10.3 mg/dL   Total Protein 6.1 (L) 6.5 - 8.1 g/dL   Albumin 2.3 (L) 3.5 - 5.0 g/dL   AST 17 15 - 41 U/L   ALT 7 0 - 44 U/L   Alkaline Phosphatase 84 38 - 126 U/L   Total Bilirubin 0.8 0.3 - 1.2 mg/dL   GFR calc non Af Amer >60 >60 mL/min   GFR calc Af Amer >60 >60 mL/min   Anion gap 9 5 - 15  Protein / creatinine ratio, urine     Status: None   Collection Time: 12/03/17  7:42 PM  Result Value Ref Range   Creatinine, Urine 305.00 mg/dL   Total Protein, Urine 31 mg/dL   Protein Creatinine Ratio 0.10 0.00 - 0.15 mg/mg[Cre]  Urinalysis, Routine w reflex microscopic     Status: Abnormal   Collection Time: 12/03/17  7:42 PM  Result Value Ref Range   Color, Urine YELLOW YELLOW   APPearance HAZY (A) CLEAR   Specific Gravity, Urine 1.020 1.005 - 1.030   pH 7.0 5.0 - 8.0   Glucose, UA NEGATIVE NEGATIVE mg/dL   Hgb urine dipstick NEGATIVE  NEGATIVE   Bilirubin Urine NEGATIVE NEGATIVE   Ketones, ur NEGATIVE NEGATIVE mg/dL   Protein, ur 30 (A)  NEGATIVE mg/dL   Nitrite NEGATIVE NEGATIVE   Leukocytes, UA NEGATIVE NEGATIVE   RBC / HPF 0-5 0 - 5 RBC/hpf   WBC, UA 0-5 0 - 5 WBC/hpf   Bacteria, UA RARE (A) NONE SEEN   Squamous Epithelial / LPF 0-5 0 - 5   Mucus PRESENT    MAU Course  Procedures Fioricet Procardia Feraheme  MDM Labs ordered and reviewed. HA resolved after meds. No evidence of PEC. Will switch to Procardia. Stable for discharge home. Discussed importance of rescheduling [place] and PN visit.   Assessment and Plan   1. [redacted] weeks gestation of pregnancy   2. NST (non-stress test) reactive on fetal surveillance   3. Chronic hypertension during pregnancy, antepartum   4. Pregnancy headache in third trimester   5. Anemia during pregnancy    Discharge home Follow up at [facility] next week Rx Procardia Rx Fe Rx Fioricet  Allergies as of 12/03/2017   No Known Allergies     Medication List    STOP taking these medications   labetalol 100 MG tablet Commonly known as:  NORMODYNE   metroNIDAZOLE 500 MG tablet Commonly known as:  FLAGYL   sulfamethoxazole-trimethoprim 800-160 MG tablet Commonly known as:  BACTRIM DS,SEPTRA DS     TAKE these medications   acetaminophen 500 MG tablet Commonly known as:  TYLENOL Take 500 mg by mouth every 6 (six) hours as needed for mild pain or moderate pain.   aspirin EC 81 MG tablet Take 1 tablet (81 mg total) by mouth daily. Take after 12 weeks for prevention of preeclampsia later in pregnancy   butalbital-acetaminophen-caffeine 50-325-40 MG tablet Commonly known as:  FIORICET, ESGIC Take 1 tablet by mouth every 6 (six) hours as needed for headache.   cyclobenzaprine 10 MG tablet Commonly known as:  FLEXERIL Take 1 tablet (10 mg total) by mouth 3 (three) times daily as needed for muscle spasms.   Iron Polysacch Cmplx-B12-FA 150-0.025-1 MG Caps Take 1 tablet by mouth daily.   NIFEdipine 30 MG 24 hr tablet Commonly known as:  ADALAT CC Take 1 tablet (30 mg total) by  mouth daily. Start taking on:  12/04/2017   ondansetron 4 MG disintegrating tablet Commonly known as:  ZOFRAN-ODT Take 1 tablet (4 mg total) by mouth every 8 (eight) hours as needed for nausea or vomiting.   promethazine 25 MG tablet Commonly known as:  PHENERGAN Take 1 tablet (25 mg total) by mouth every 6 (six) hours as needed for nausea or vomiting.      Donette Larry, CNM 12/03/2017, 8:47 PM

## 2017-12-07 ENCOUNTER — Other Ambulatory Visit: Payer: Self-pay

## 2017-12-07 ENCOUNTER — Inpatient Hospital Stay (HOSPITAL_COMMUNITY)
Admission: AD | Admit: 2017-12-07 | Discharge: 2017-12-07 | Disposition: A | Discharge status: Home / Self Care | Payer: Medicaid Other | Source: Ambulatory Visit | Attending: Family Medicine | Admitting: Family Medicine

## 2017-12-07 ENCOUNTER — Encounter (HOSPITAL_COMMUNITY): Payer: Self-pay

## 2017-12-07 DIAGNOSIS — O26899 Other specified pregnancy related conditions, unspecified trimester: Secondary | ICD-10-CM

## 2017-12-07 DIAGNOSIS — R102 Pelvic and perineal pain unspecified side: Secondary | ICD-10-CM

## 2017-12-07 DIAGNOSIS — O26892 Other specified pregnancy related conditions, second trimester: Secondary | ICD-10-CM | POA: Diagnosis not present

## 2017-12-07 DIAGNOSIS — Z3A29 29 weeks gestation of pregnancy: Secondary | ICD-10-CM | POA: Diagnosis not present

## 2017-12-07 DIAGNOSIS — R109 Unspecified abdominal pain: Secondary | ICD-10-CM | POA: Diagnosis present

## 2017-12-07 DIAGNOSIS — O26893 Other specified pregnancy related conditions, third trimester: Secondary | ICD-10-CM | POA: Insufficient documentation

## 2017-12-07 LAB — URINALYSIS, ROUTINE W REFLEX MICROSCOPIC
Bilirubin Urine: NEGATIVE
Glucose, UA: NEGATIVE mg/dL
Hgb urine dipstick: NEGATIVE
Ketones, ur: NEGATIVE mg/dL
Nitrite: NEGATIVE
Protein, ur: NEGATIVE mg/dL
Specific Gravity, Urine: 1.002 — ABNORMAL LOW (ref 1.005–1.030)
pH: 8 (ref 5.0–8.0)

## 2017-12-07 MED ORDER — COMFORT FIT MATERNITY SUPP SM MISC: 1.0000 [IU] | Freq: Every day | 0 refills | Status: DC | PRN

## 2017-12-07 NOTE — Discharge Instructions (Signed)
Round Ligament Pain °The round ligament is a cord of muscle and tissue that helps to support the uterus. It can become a source of pain during pregnancy if it becomes stretched or twisted as the baby grows. The pain usually begins in the second trimester of pregnancy, and it can come and go until the baby is delivered. It is not a serious problem, and it does not cause harm to the baby. °Round ligament pain is usually a short, sharp, and pinching pain, but it can also be a dull, lingering, and aching pain. The pain is felt in the lower side of the abdomen or in the groin. It usually starts deep in the groin and moves up to the outside of the hip area. Pain can occur with: °· A sudden change in position. °· Rolling over in bed. °· Coughing or sneezing. °· Physical activity. ° °Follow these instructions at home: °Watch your condition for any changes. Take these steps to help with your pain: °· When the pain starts, relax. Then try: °? Sitting down. °? Flexing your knees up to your abdomen. °? Lying on your side with one pillow under your abdomen and another pillow between your legs. °? Sitting in a warm bath for 15-20 minutes or until the pain goes away. °· Take over-the-counter and prescription medicines only as told by your health care provider. °· Move slowly when you sit and stand. °· Avoid long walks if they cause pain. °· Stop or lessen your physical activities if they cause pain. ° °Contact a health care provider if: °· Your pain does not go away with treatment. °· You feel pain in your back that you did not have before. °· Your medicine is not helping. °Get help right away if: °· You develop a fever or chills. °· You develop uterine contractions. °· You develop vaginal bleeding. °· You develop nausea or vomiting. °· You develop diarrhea. °· You have pain when you urinate. °This information is not intended to replace advice given to you by your health care provider. Make sure you discuss any questions you have  with your health care provider. °Document Released: 11/12/2007 Document Revised: 07/11/2015 Document Reviewed: 04/11/2014 °Elsevier Interactive Patient Education © 2018 Elsevier Inc. ° °Preterm Labor and Birth Information °The normal length of a pregnancy is 39-41 weeks. Preterm labor is when labor starts before 37 completed weeks of pregnancy. °What are the risk factors for preterm labor? °Preterm labor is more likely to occur in women who: °· Have certain infections during pregnancy such as a bladder infection, sexually transmitted infection, or infection inside the uterus (chorioamnionitis). °· Have a shorter-than-normal cervix. °· Have gone into preterm labor before. °· Have had surgery on their cervix. °· Are younger than age 17 or older than age 35. °· Are African American. °· Are pregnant with twins or multiple babies (multiple gestation). °· Take street drugs or smoke while pregnant. °· Do not gain enough weight while pregnant. °· Became pregnant shortly after having been pregnant. ° °What are the symptoms of preterm labor? °Symptoms of preterm labor include: °· Cramps similar to those that can happen during a menstrual period. The cramps may happen with diarrhea. °· Pain in the abdomen or lower back. °· Regular uterine contractions that may feel like tightening of the abdomen. °· A feeling of increased pressure in the pelvis. °· Increased watery or bloody mucus discharge from the vagina. °· Water breaking (ruptured amniotic sac). ° °Why is it important to recognize signs   of preterm labor? °It is important to recognize signs of preterm labor because babies who are born prematurely may not be fully developed. This can put them at an increased risk for: °· Long-term (chronic) heart and lung problems. °· Difficulty immediately after birth with regulating body systems, including blood sugar, body temperature, heart rate, and breathing rate. °· Bleeding in the brain. °· Cerebral palsy. °· Learning  difficulties. °· Death. ° °These risks are highest for babies who are born before 34 weeks of pregnancy. °How is preterm labor treated? °Treatment depends on the length of your pregnancy, your condition, and the health of your baby. It may involve: °· Having a stitch (suture) placed in your cervix to prevent your cervix from opening too early (cerclage). °· Taking or being given medicines, such as: °? Hormone medicines. These may be given early in pregnancy to help support the pregnancy. °? Medicine to stop contractions. °? Medicines to help mature the baby’s lungs. These may be prescribed if the risk of delivery is high. °? Medicines to prevent your baby from developing cerebral palsy. ° °If the labor happens before 34 weeks of pregnancy, you may need to stay in the hospital. °What should I do if I think I am in preterm labor? °If you think that you are going into preterm labor, call your health care provider right away. °How can I prevent preterm labor in future pregnancies? °To increase your chance of having a full-term pregnancy: °· Do not use any tobacco products, such as cigarettes, chewing tobacco, and e-cigarettes. If you need help quitting, ask your health care provider. °· Do not use street drugs or medicines that have not been prescribed to you during your pregnancy. °· Talk with your health care provider before taking any herbal supplements, even if you have been taking them regularly. °· Make sure you gain a healthy amount of weight during your pregnancy. °· Watch for infection. If you think that you might have an infection, get it checked right away. °· Make sure to tell your health care provider if you have gone into preterm labor before. ° °This information is not intended to replace advice given to you by your health care provider. Make sure you discuss any questions you have with your health care provider. °Document Released: 04/25/2003 Document Revised: 07/16/2015 Document Reviewed:  06/26/2015 °Elsevier Interactive Patient Education © 2018 Elsevier Inc. ° °

## 2017-12-07 NOTE — MAU Note (Signed)
Pt reports to MAU c/o ctxs every 2-3 mins that has been ongoing for the last . Pt reports +FM, no bleeding or LOF.

## 2017-12-07 NOTE — MAU Provider Note (Signed)
Chief Complaint:  Contractions   First Provider Initiated Contact with Patient 12/07/17 2103     HPI: Monique Gamble is a 27 y.o. [id number] at [redacted]w[redacted]d who presents to maternity admissions reporting abdominal pain. Symptoms began this evening. Can't tell if she's having contractions. Denies n/v/d, constipation, dysuria, vaginal bleeding, LOF, or recent intercourse. Positive fetal movement.   Location: bilateral lower abdomen Quality: sharp Severity: 10/10 in pain scale Duration: 1 hour Timing: intermittent. Occurs with movement.  Modifying factors: worse when moving her legs.  Associated signs and symptoms: none   Past Medical History:  Diagnosis Date  . ADHD (attention deficit hyperactivity disorder)   . Chlamydia   . Headache   . History of anemia   . Hypertension   . Pregnancy induced hypertension    OB History  Gravida Para Term Preterm AB Living  8 5 5  0 2 5  SAB TAB Ectopic Multiple Live Births  1 1 0 0 5    # Outcome Date GA Lbr Len/2nd Weight Sex Delivery Anes PTL Lv  8 Current           7 SAB 04/2017          6 Term 08/22/16 [redacted]w[redacted]d 429:40 / 00:07 3110 g M Vag-Spont None  LIV  5 Term 01/21/15 [redacted]w[redacted]d 02:38 / 00:03 2807 g M Vag-Spont None  LIV  4 Term 03/15/14 [redacted]w[redacted]d 03:15 / 00:02 3195 g M Vag-Spont None  LIV  3 Term 08/15/11 [redacted]w[redacted]d 01:38 / 00:14 3065 g F Vag-Spont None  LIV  2 Term 2009 [redacted]w[redacted]d  2977 g F Vag-Spont   LIV  1 TAB 2005           Past Surgical History:  Procedure Laterality Date  . INDUCED ABORTION     Family History  Problem Relation Age of Onset  . Hypertension Mother   . Hypertension Father   . Diabetes Sister   . Anesthesia problems Neg Hx    Social History   Tobacco Use  . Smoking status: Never Smoker  . Smokeless tobacco: Never Used  Substance Use Topics  . Alcohol use: No  . Drug use: Not Currently    Types: Marijuana    Comment: last use 03 Jul 2017   No Known Allergies No medications prior to admission.    I have reviewed  patient's Past Medical Hx, Surgical Hx, Family Hx, Social Hx, medications and allergies.   ROS:  Review of Systems  Constitutional: Negative.   Gastrointestinal: Positive for abdominal pain.  Genitourinary: Negative.     Physical Exam   Patient Vitals for the past 24 hrs:  BP Temp Temp src Pulse Resp Height Weight  12/07/17 2136 129/78 - - - - - -  12/07/17 2042 129/82 98.6 F (37 C) Oral (!) 106 19 5\' 6"  (1.676 m) 81.6 kg    Constitutional: Well-developed, well-nourished female in no acute distress.  Cardiovascular: normal rate & rhythm, no murmur Respiratory: normal effort, lung sounds clear throughout GI: Abd soft, non-tender, gravid appropriate for gestational age. Pos BS x 4 MS: Extremities nontender, no edema, normal ROM Neurologic: Alert and oriented x 4.  GU:      Pelvic: NEFG, physiologic discharge, no blood, cervix clean.   Dilation: Closed Effacement (%): Thick Exam by:: Estanislado Spire NP  NST:  Baseline: 145 bpm, Variability: Good {> 6 bpm), Accelerations: Non-reactive but appropriate for gestational age, Decelerations: Absent and no contractions   Labs: Results for orders placed or performed during  the hospital encounter of 12/07/17 (from the past 24 hour(s))  Urinalysis, Routine w reflex microscopic     Status: Abnormal   Collection Time: 12/07/17  8:56 PM  Result Value Ref Range   Color, Urine STRAW (A) YELLOW   APPearance CLEAR CLEAR   Specific Gravity, Urine 1.002 (L) 1.005 - 1.030   pH 8.0 5.0 - 8.0   Glucose, UA NEGATIVE NEGATIVE mg/dL   Hgb urine dipstick NEGATIVE NEGATIVE   Bilirubin Urine NEGATIVE NEGATIVE   Ketones, ur NEGATIVE NEGATIVE mg/dL   Protein, ur NEGATIVE NEGATIVE mg/dL   Nitrite NEGATIVE NEGATIVE   Leukocytes, UA TRACE (A) NEGATIVE   RBC / HPF 0-5 0 - 5 RBC/hpf   WBC, UA 0-5 0 - 5 WBC/hpf   Bacteria, UA RARE (A) NONE SEEN   Squamous Epithelial / LPF 6-10 0 - 5    Imaging:  No results found.  MAU Course: Orders Placed This  Encounter  Procedures  . Urinalysis, Routine w reflex microscopic  . Discharge patient   Meds ordered this encounter  Medications  . Elastic Bandages & Supports (COMFORT FIT MATERNITY SUPP SM) MISC    Sig: 1 Units by Does not apply route daily as needed.    Dispense:  1 each    Refill:  0    Order Specific Question:   Supervising Provider    Answer:   ERVIN, MICHAEL L [[id number]]    MDM: No contractions on monitor. While in room, pt reports feeling pain when she was repositioning her legs; abdomen soft, no ctx palpated; pain stopped when she stopped moving her leg.  Cervix closed/thick U/a negative Will send home with info/rx for maternity support belt  Assessment: 1. Pain of round ligament affecting pregnancy, antepartum   2. [redacted] weeks gestation of pregnancy     Plan: Discharge home in stable condition.  Preterm Labor precautions and fetal kick counts Follow-up Information    [facility] Follow up.   Why:  to fill maternity support belt prescription Contact information: [address] [place] [address] [phone]           Allergies as of 12/07/2017   No Known Allergies     Medication List    TAKE these medications   acetaminophen 500 MG tablet Commonly known as:  TYLENOL Take 500 mg by mouth every 6 (six) hours as needed for mild pain or moderate pain.   aspirin EC 81 MG tablet Take 1 tablet (81 mg total) by mouth daily. Take after 12 weeks for prevention of preeclampsia later in pregnancy   butalbital-acetaminophen-caffeine 50-325-40 MG tablet Commonly known as:  FIORICET, ESGIC Take 1 tablet by mouth every 6 (six) hours as needed for headache.   COMFORT FIT MATERNITY SUPP SM Misc 1 Units by Does not apply route daily as needed.   cyclobenzaprine 10 MG tablet Commonly known as:  FLEXERIL Take 1 tablet (10 mg total) by mouth 3 (three) times daily as needed for muscle spasms.   Iron Polysacch Cmplx-B12-FA 150-0.025-1  MG Caps Take 1 tablet by mouth daily.   NIFEdipine 30 MG 24 hr tablet Commonly known as:  ADALAT CC Take 1 tablet (30 mg total) by mouth daily.   ondansetron 4 MG disintegrating tablet Commonly known as:  ZOFRAN-ODT Take 1 tablet (4 mg total) by mouth every 8 (eight) hours as needed for nausea or vomiting.   promethazine 25 MG tablet Commonly known as:  PHENERGAN Take 1 tablet (25 mg  total) by mouth every 6 (six) hours as needed for nausea or vomiting.       Judeth Horn, NP 12/07/2017 10:01 PM

## 2017-12-08 ENCOUNTER — Other Ambulatory Visit: Payer: Self-pay

## 2017-12-13 ENCOUNTER — Encounter: Payer: Self-pay | Admitting: Family Medicine

## 2017-12-13 ENCOUNTER — Telehealth: Payer: Self-pay | Admitting: Family Medicine

## 2017-12-13 NOTE — Telephone Encounter (Signed)
Patient has been having some car trouble, but she will be back on the road after Friday 11/01.

## 2017-12-20 ENCOUNTER — Ambulatory Visit (INDEPENDENT_AMBULATORY_CARE_PROVIDER_SITE_OTHER): Payer: Medicaid Other | Admitting: Family Medicine

## 2017-12-20 ENCOUNTER — Other Ambulatory Visit: Payer: Medicaid Other

## 2017-12-20 VITALS — BP 139/84 | HR 96 | Wt 185.8 lb

## 2017-12-20 DIAGNOSIS — O9989 Other specified diseases and conditions complicating pregnancy, childbirth and the puerperium: Secondary | ICD-10-CM | POA: Diagnosis not present

## 2017-12-20 DIAGNOSIS — O0993 Supervision of high risk pregnancy, unspecified, third trimester: Secondary | ICD-10-CM

## 2017-12-20 DIAGNOSIS — O10913 Unspecified pre-existing hypertension complicating pregnancy, third trimester: Secondary | ICD-10-CM

## 2017-12-20 DIAGNOSIS — R51 Headache: Secondary | ICD-10-CM

## 2017-12-20 DIAGNOSIS — Z283 Underimmunization status: Secondary | ICD-10-CM

## 2017-12-20 DIAGNOSIS — O09899 Supervision of other high risk pregnancies, unspecified trimester: Secondary | ICD-10-CM

## 2017-12-20 DIAGNOSIS — O0990 Supervision of high risk pregnancy, unspecified, unspecified trimester: Secondary | ICD-10-CM | POA: Diagnosis not present

## 2017-12-20 DIAGNOSIS — O99013 Anemia complicating pregnancy, third trimester: Secondary | ICD-10-CM

## 2017-12-20 DIAGNOSIS — R519 Headache, unspecified: Secondary | ICD-10-CM

## 2017-12-20 DIAGNOSIS — O10919 Unspecified pre-existing hypertension complicating pregnancy, unspecified trimester: Secondary | ICD-10-CM

## 2017-12-20 MED ORDER — BUTALBITAL-APAP-CAFFEINE 50-325-40 MG PO TABS: 1.0000 | ORAL_TABLET | Freq: Four times a day (QID) | ORAL | 0 refills | Status: DC | PRN

## 2017-12-20 NOTE — Progress Notes (Signed)
   PRENATAL VISIT NOTE  Subjective:  Monique Gamble is a 27 y.o. [id number] at 19w0dbeing seen today for ongoing prenatal care.  She is currently monitored for the following issues for this high-risk pregnancy and has Rubella non-immune status, antepartum; Supervision of high-risk pregnancy; H/O pre-eclampsia in prior pregnancy, currently pregnant; No prenatal care in current pregnancy; Anemia in pregnancy; and Chronic hypertension during pregnancy, antepartum on their problem list.  Patient reports headache.  Contractions: Irritability. Vag. Bleeding: None.  Movement: Present. Denies leaking of fluid.   The following portions of the patient's history were reviewed and updated as appropriate: allergies, current medications, past family history, past medical history, past social history, past surgical history and problem list. Problem list updated.  Objective:   Vitals:   12/20/17 0901  BP: 139/84  Pulse: 96  Weight: 185 lb 12.8 oz (84.3 kg)    Fetal Status: Fetal Heart Rate (bpm): 140 Fundal Height: 31 cm Movement: Present     General:  Alert, oriented and cooperative. Patient is in no acute distress.  Skin: Skin is warm and dry. No rash noted.   Cardiovascular: Normal heart rate noted  Respiratory: Normal respiratory effort, no problems with respiration noted  Abdomen: Soft, gravid, appropriate for gestational age.  Pain/Pressure: Present     Pelvic: Cervical exam deferred        Extremities: Normal range of motion.  Edema: Trace  Mental Status: Normal mood and affect. Normal behavior. Normal judgment and thought content.   Assessment and Plan:  Pregnancy: [id number] 355w0d1. Supervision of high risk pregnancy in third trimester FHT and FH normal  2. Chronic hypertension during pregnancy, antepartum BP okay.  Growth [place] antenatal testing.  3. Frequent headaches Improved with fioricet. Will used limited amount  4. Rubella non-immune status, antepartum MMR  postpartum  5. Anemia in pregnancy Feraheme injection x2 doses.  Preterm labor symptoms and general obstetric precautions including but not limited to vaginal bleeding, contractions, leaking of fluid and fetal movement were reviewed in detail with the patient. Please refer to After Visit Summary for other counseling recommendations.  Return in about 1 week (around 12/27/2017) for NST/BPP.  No future appointments.  JaTruett MainlandDO

## 2017-12-21 LAB — GLUCOSE TOLERANCE, 2 HOURS W/ 1HR
GLUCOSE, 2 HOUR: 89 mg/dL (ref 65–152)
Glucose, 1 hour: 79 mg/dL (ref 65–179)
Glucose, Fasting: 101 mg/dL — ABNORMAL HIGH (ref 65–91)

## 2017-12-22 ENCOUNTER — Encounter: Payer: Self-pay | Admitting: *Deleted

## 2017-12-24 ENCOUNTER — Encounter: Payer: Self-pay | Admitting: Obstetrics & Gynecology

## 2017-12-24 DIAGNOSIS — O24419 Gestational diabetes mellitus in pregnancy, unspecified control: Secondary | ICD-10-CM | POA: Insufficient documentation

## 2017-12-27 ENCOUNTER — Telehealth: Payer: Self-pay | Admitting: *Deleted

## 2017-12-27 NOTE — Telephone Encounter (Signed)
Called pt to inform her that of her diagnosis of GDM d/t an abnormal GTT.  Pt did not pick up.  Left voicemail requesting the pt call the clinic back for results.

## 2017-12-27 NOTE — Telephone Encounter (Signed)
-----   Message from Allie Bossier, MD sent at 12/24/2017 10:32 AM EST ----- Please let her know that her GTT was abnormal.  She will need to be treated for GDM Will need a visit in the next couple of weeks.

## 2017-12-28 ENCOUNTER — Encounter (HOSPITAL_COMMUNITY): Payer: Self-pay

## 2017-12-28 ENCOUNTER — Ambulatory Visit (HOSPITAL_COMMUNITY): Admission: RE | Admit: 2017-12-28 | Payer: Medicaid Other | Source: Ambulatory Visit

## 2017-12-28 IMAGING — CR DG ANKLE COMPLETE 3+V*L*
3 series · 3 of 3 positions shown · non-contrast
Comparison: None.

CLINICAL DATA: Left ankle pain and swelling for 1 day. No known
injury. Initial encounter.

EXAM:
LEFT ANKLE COMPLETE - 3+ VIEW

[x ankle ap left]
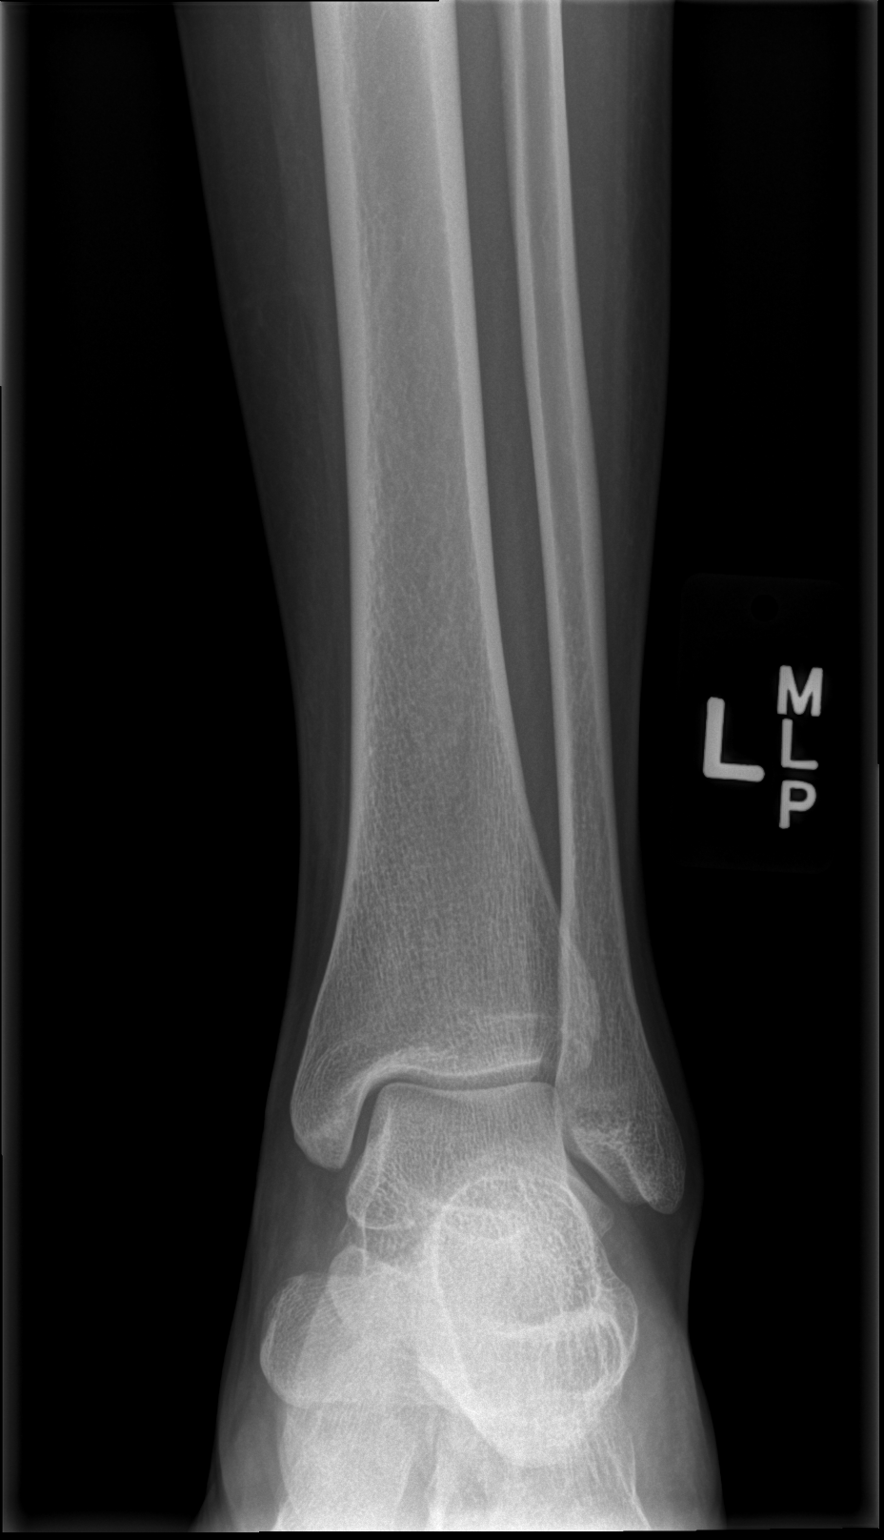

[x ankle obl left]
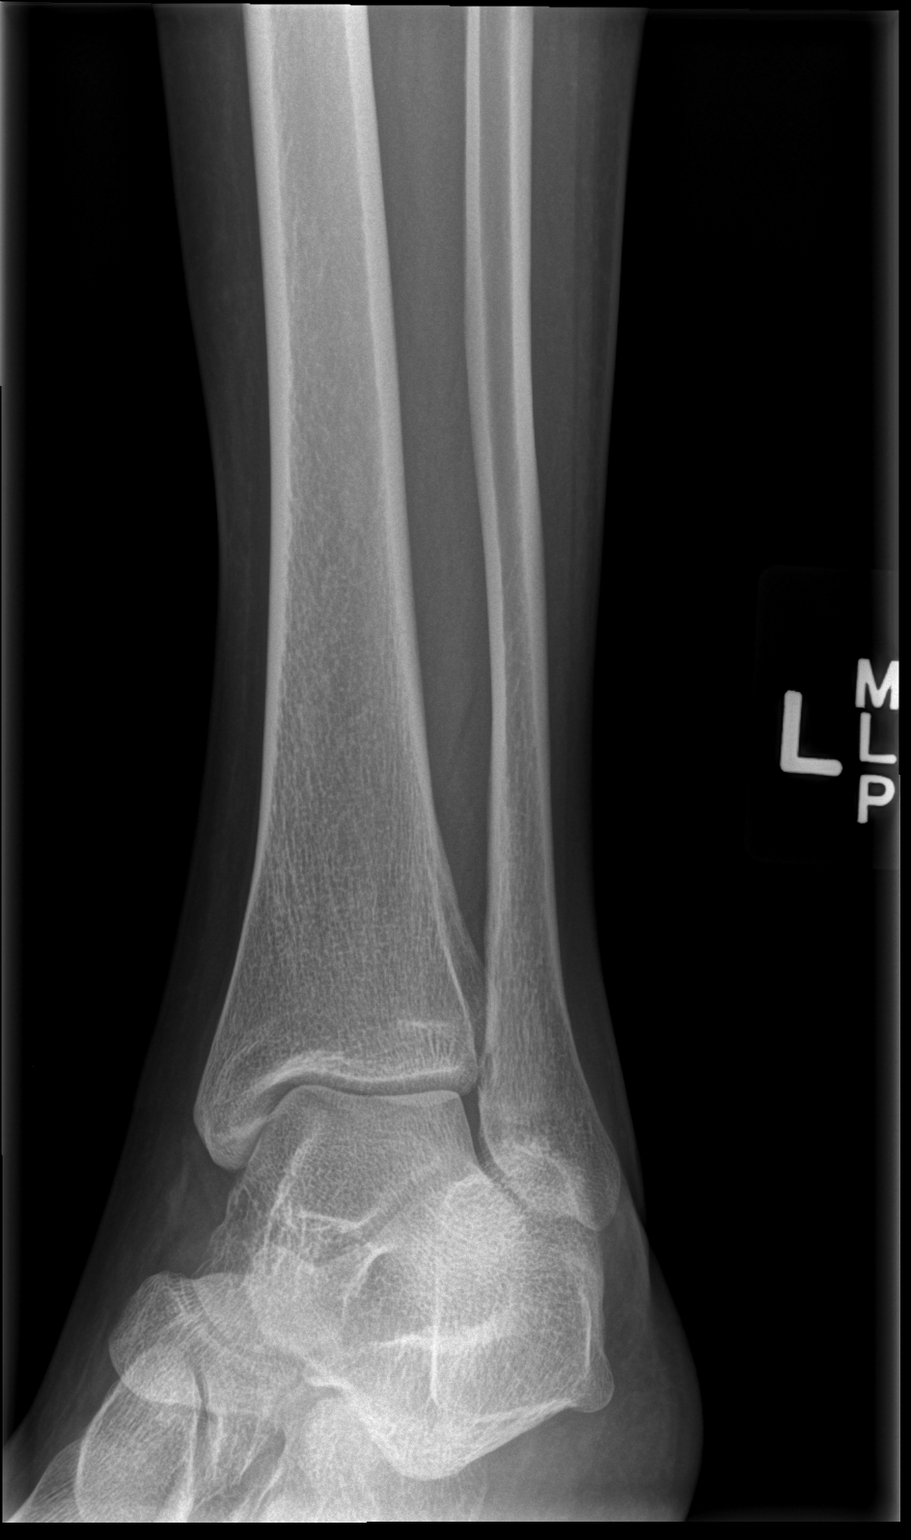

[x ankle lat left]
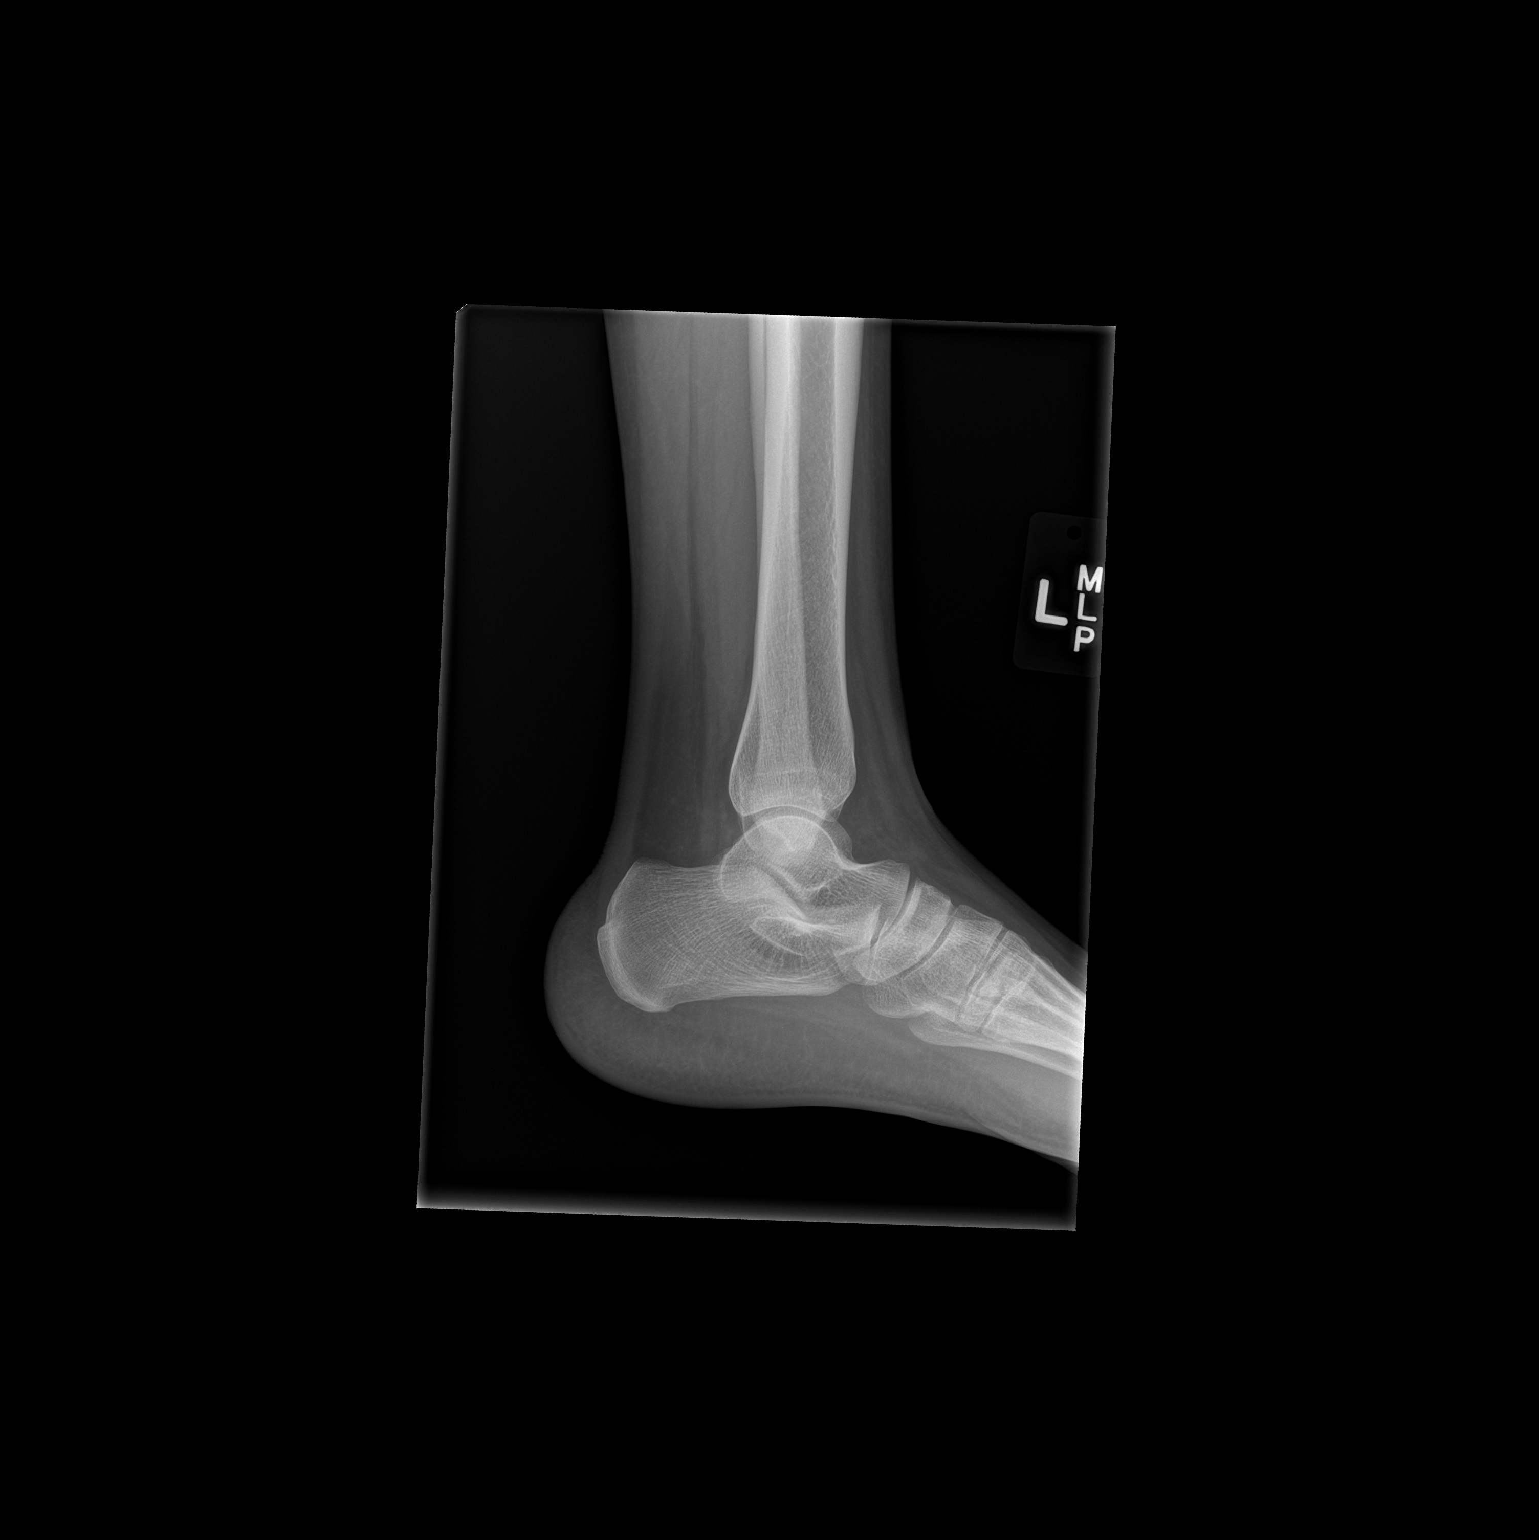

[3 of 3 positions shown; findings below may reference images not displayed]

FINDINGS: There is no evidence of fracture, dislocation, or joint effusion.
There is no evidence of arthropathy or other focal bone abnormality.
Soft tissues are unremarkable.
IMPRESSION: Negative.

## 2017-12-28 NOTE — Telephone Encounter (Signed)
Called pt to advise her of her test results for GDM, no answer and VM Full. Pt has a appointment on 11/18 will advise then

## 2017-12-29 ENCOUNTER — Ambulatory Visit (HOSPITAL_COMMUNITY): Admission: RE | Admit: 2017-12-29 | Payer: Medicaid Other | Source: Ambulatory Visit

## 2017-12-31 ENCOUNTER — Encounter (HOSPITAL_COMMUNITY): Payer: Self-pay | Admitting: *Deleted

## 2017-12-31 ENCOUNTER — Inpatient Hospital Stay (HOSPITAL_COMMUNITY)
Admission: AD | Admit: 2017-12-31 | Discharge: 2018-01-01 | Disposition: A | Discharge status: Home / Self Care | Payer: Medicaid Other | Attending: Obstetrics & Gynecology | Admitting: Obstetrics & Gynecology

## 2017-12-31 DIAGNOSIS — Z8249 Family history of ischemic heart disease and other diseases of the circulatory system: Secondary | ICD-10-CM | POA: Insufficient documentation

## 2017-12-31 DIAGNOSIS — F909 Attention-deficit hyperactivity disorder, unspecified type: Secondary | ICD-10-CM | POA: Insufficient documentation

## 2017-12-31 DIAGNOSIS — Z7982 Long term (current) use of aspirin: Secondary | ICD-10-CM | POA: Insufficient documentation

## 2017-12-31 DIAGNOSIS — O163 Unspecified maternal hypertension, third trimester: Secondary | ICD-10-CM | POA: Insufficient documentation

## 2017-12-31 DIAGNOSIS — O24419 Gestational diabetes mellitus in pregnancy, unspecified control: Secondary | ICD-10-CM | POA: Diagnosis not present

## 2017-12-31 DIAGNOSIS — O24410 Gestational diabetes mellitus in pregnancy, diet controlled: Secondary | ICD-10-CM

## 2017-12-31 DIAGNOSIS — Z79899 Other long term (current) drug therapy: Secondary | ICD-10-CM | POA: Insufficient documentation

## 2017-12-31 DIAGNOSIS — O10919 Unspecified pre-existing hypertension complicating pregnancy, unspecified trimester: Secondary | ICD-10-CM

## 2017-12-31 DIAGNOSIS — Z3A32 32 weeks gestation of pregnancy: Secondary | ICD-10-CM | POA: Insufficient documentation

## 2017-12-31 DIAGNOSIS — O10913 Unspecified pre-existing hypertension complicating pregnancy, third trimester: Secondary | ICD-10-CM | POA: Diagnosis not present

## 2017-12-31 DIAGNOSIS — O99343 Other mental disorders complicating pregnancy, third trimester: Secondary | ICD-10-CM | POA: Insufficient documentation

## 2017-12-31 DIAGNOSIS — O4703 False labor before 37 completed weeks of gestation, third trimester: Secondary | ICD-10-CM | POA: Diagnosis not present

## 2017-12-31 LAB — URINALYSIS, ROUTINE W REFLEX MICROSCOPIC
Bilirubin Urine: NEGATIVE
GLUCOSE, UA: NEGATIVE mg/dL
HGB URINE DIPSTICK: NEGATIVE
KETONES UR: NEGATIVE mg/dL
NITRITE: NEGATIVE
PH: 8 (ref 5.0–8.0)
PROTEIN: 100 mg/dL — AB
Specific Gravity, Urine: 1.023 (ref 1.005–1.030)

## 2017-12-31 LAB — WET PREP, GENITAL
CLUE CELLS WET PREP: NONE SEEN
Sperm: NONE SEEN
Trich, Wet Prep: NONE SEEN
Yeast Wet Prep HPF POC: NONE SEEN

## 2017-12-31 MED ORDER — NIFEDIPINE 10 MG PO CAPS
10.0000 mg | ORAL_CAPSULE | ORAL | Status: AC
  Administered 2017-12-31: 10 mg via ORAL
  Filled 2017-12-31 (×2): qty 1

## 2017-12-31 MED ORDER — LACTATED RINGERS IV BOLUS
1000.0000 mL | Freq: Once | INTRAVENOUS | Status: AC
  Administered 2017-12-31: 1000 mL via INTRAVENOUS

## 2017-12-31 NOTE — MAU Provider Note (Signed)
History     CSN: [id number]  Arrival date and time: 12/31/17 2137   First Provider Initiated Contact with Patient 12/31/17 2210      Chief Complaint  Patient presents with  . Contractions   Monique Gamble is a 27 y.o. [id number] at [redacted]w[redacted]d who presents today with contractions. She states that the contractions started around 1900, and have gotten stronger and closer together since then. She denies any VB or LOF. She reports normal fetal movement. She was last checked on 10/22 and cervix was closed at that time. Patient states that yesterday she had N/V/D that she felt was from a stomach virus. She has been fine today.   Pelvic Pain  The patient's primary symptoms include pelvic pain. The patient's pertinent negatives include no vaginal discharge. This is a new problem. The current episode started today. The problem occurs intermittently. The problem has been gradually worsening. The problem affects both sides. She is pregnant. Pertinent negatives include no chills, diarrhea, dysuria, fever, frequency, nausea or vomiting. The vaginal discharge was normal. There has been no bleeding. Nothing aggravates the symptoms. She has tried nothing for the symptoms. Sexual activity: denies intercourse in the last 24 hours.     OB History    Gravida  8   Para  5   Term  5   Preterm  0   AB  2   Living  5     SAB  1   TAB  1   Ectopic  0   Multiple  0   Live Births  5           Past Medical History:  Diagnosis Date  . ADHD (attention deficit hyperactivity disorder)   . Chlamydia   . Headache   . History of anemia   . Hypertension   . Pregnancy induced hypertension     Past Surgical History:  Procedure Laterality Date  . INDUCED ABORTION      Family History  Problem Relation Age of Onset  . Hypertension Mother   . Hypertension Father   . Diabetes Sister   . Anesthesia problems Neg Hx     Social History   Tobacco Use  . Smoking status: Never Smoker  .  Smokeless tobacco: Never Used  Substance Use Topics  . Alcohol use: No  . Drug use: Not Currently    Types: Marijuana    Comment: last use 03 Jul 2017    Allergies: No Known Allergies  Medications Prior to Admission  Medication Sig Dispense Refill Last Dose  . acetaminophen (TYLENOL) 500 MG tablet Take 500 mg by mouth every 6 (six) hours as needed for mild pain or moderate pain.   Taking  . aspirin EC 81 MG tablet Take 1 tablet (81 mg total) by mouth daily. Take after 12 weeks for prevention of preeclampsia later in pregnancy 300 tablet 2 Taking  . butalbital-acetaminophen-caffeine (FIORICET, ESGIC) 50-325-40 MG tablet Take 1 tablet by mouth every 6 (six) hours as needed for headache. 10 tablet 0   . cyclobenzaprine (FLEXERIL) 10 MG tablet Take 1 tablet (10 mg total) by mouth 3 (three) times daily as needed for muscle spasms. 30 tablet 3 Taking  . Elastic Bandages & Supports (COMFORT FIT MATERNITY SUPP SM) MISC 1 Units by Does not apply route daily as needed. (Patient not taking: Reported on 12/20/2017) 1 each 0 Not Taking  . Iron Polysacch Cmplx-B12-FA 150-0.025-1 MG CAPS Take 1 tablet by mouth daily. (Patient not taking: Reported  on 12/20/2017) 30 each 2 Not Taking  . NIFEdipine (ADALAT CC) 30 MG 24 hr tablet Take 1 tablet (30 mg total) by mouth daily. 30 tablet 2 Taking  . ondansetron (ZOFRAN ODT) 4 MG disintegrating tablet Take 1 tablet (4 mg total) by mouth every 8 (eight) hours as needed for nausea or vomiting. 30 tablet 0 Taking  . promethazine (PHENERGAN) 25 MG tablet Take 1 tablet (25 mg total) by mouth every 6 (six) hours as needed for nausea or vomiting. 30 tablet 3 Taking    Review of Systems  Constitutional: Negative for chills and fever.  Gastrointestinal: Negative for diarrhea, nausea and vomiting.  Genitourinary: Positive for pelvic pain. Negative for dysuria, frequency, vaginal bleeding and vaginal discharge.   Physical Exam   Blood pressure 133/79, pulse 87, temperature  98.2 F (36.8 C), resp. rate 18, height 5\' 6"  (1.676 m), weight 83 kg, last menstrual period 05/17/2017, not currently breastfeeding.  Physical Exam  Nursing note and vitals reviewed. Constitutional: She is oriented to person, place, and time. She appears well-developed and well-nourished. No distress.  HENT:  Head: Normocephalic.  Cardiovascular: Normal rate.  Respiratory: Effort normal.  GI: Soft. There is no tenderness. There is no rebound.  Genitourinary:  Genitourinary Comments:  External: no lesion Vagina: small amount of white discharge Cervix: pink, smooth, closed/thick  Uterus:AGA    Neurological: She is alert and oriented to person, place, and time.  Skin: Skin is warm and dry.  Psychiatric: She has a normal mood and affect.    NST:  Baseline: 145 Variability: moderate Accels: 15x15 Decels: none Toco: q 4 mins   Results for orders placed or performed during the hospital encounter of 12/31/17 (from the past 24 hour(s))  Urinalysis, Routine w reflex microscopic     Status: Abnormal   Collection Time: 12/31/17  9:58 PM  Result Value Ref Range   Color, Urine AMBER (A) YELLOW   APPearance HAZY (A) CLEAR   Specific Gravity, Urine 1.023 1.005 - 1.030   pH 8.0 5.0 - 8.0   Glucose, UA NEGATIVE NEGATIVE mg/dL   Hgb urine dipstick NEGATIVE NEGATIVE   Bilirubin Urine NEGATIVE NEGATIVE   Ketones, ur NEGATIVE NEGATIVE mg/dL   Protein, ur 416 (A) NEGATIVE mg/dL   Nitrite NEGATIVE NEGATIVE   Leukocytes, UA TRACE (A) NEGATIVE   RBC / HPF 0-5 0 - 5 RBC/hpf   WBC, UA 21-50 0 - 5 WBC/hpf   Bacteria, UA RARE (A) NONE SEEN   Squamous Epithelial / LPF 11-20 0 - 5   Mucus PRESENT   Wet prep, genital     Status: Abnormal   Collection Time: 12/31/17 10:25 PM  Result Value Ref Range   Yeast Wet Prep HPF POC NONE SEEN NONE SEEN   Trich, Wet Prep NONE SEEN NONE SEEN   Clue Cells Wet Prep HPF POC NONE SEEN NONE SEEN   WBC, Wet Prep HPF POC FEW (A) NONE SEEN   Sperm NONE SEEN      MAU Course  Procedures  MDM Patient has had 1L of LR and 10mg  procardia. Contractions have stopped, and she denies feeling anymore contractions.   Assessment and Plan   1. Threatened premature labor in third trimester   2. Gestational diabetes mellitus (GDM), antepartum, gestational diabetes method of control unspecified   3. Chronic hypertension during pregnancy, antepartum   4. [redacted] weeks gestation of pregnancy    DC home Comfort measures reviewed  3rd Trimester precautions  PTL precautions  Fetal  kick counts RX: none  Return to MAU as needed FU with OB as planned  Follow-up Information    [facility].   Specialty:  Obstetrics and Gynecology Contact information: [address] [place] [id number] [phone]           Thressa ShellerHeather Hogan 12/31/2017, 10:15 PM

## 2017-12-31 NOTE — MAU Note (Addendum)
Pt c/o contractions since about 7pm . Denies any vag bleeding or discharge. Good fetal movement reported. Pt stated she had some diarrhea 2 days ago none today.

## 2018-01-01 NOTE — Progress Notes (Signed)
Written and verbal d/c instructions given and understanding voiced. 

## 2018-01-01 NOTE — Discharge Instructions (Signed)

## 2018-01-03 ENCOUNTER — Encounter: Payer: Self-pay | Admitting: Family Medicine

## 2018-01-03 LAB — GC/CHLAMYDIA PROBE AMP (~~LOC~~) NOT AT ARMC
Chlamydia: NEGATIVE
NEISSERIA GONORRHEA: NEGATIVE

## 2018-01-04 ENCOUNTER — Encounter: Payer: Self-pay | Admitting: Advanced Practice Midwife

## 2018-01-04 ENCOUNTER — Ambulatory Visit (HOSPITAL_COMMUNITY): Admission: RE | Admit: 2018-01-04 | Payer: Medicaid Other | Source: Ambulatory Visit

## 2018-01-05 ENCOUNTER — Encounter (HOSPITAL_COMMUNITY): Payer: Self-pay

## 2018-01-05 DIAGNOSIS — R112 Nausea with vomiting, unspecified: Secondary | ICD-10-CM | POA: Diagnosis not present

## 2018-01-05 DIAGNOSIS — O479 False labor, unspecified: Secondary | ICD-10-CM | POA: Diagnosis not present

## 2018-01-05 DIAGNOSIS — G4489 Other headache syndrome: Secondary | ICD-10-CM | POA: Diagnosis not present

## 2018-01-06 ENCOUNTER — Inpatient Hospital Stay (HOSPITAL_COMMUNITY)
Admission: AD | Admit: 2018-01-06 | Discharge: 2018-01-06 | Disposition: A | Discharge status: Home / Self Care | Payer: Medicaid Other | Source: Ambulatory Visit | Attending: Obstetrics & Gynecology | Admitting: Obstetrics & Gynecology

## 2018-01-06 ENCOUNTER — Encounter: Payer: Self-pay | Admitting: *Deleted

## 2018-01-06 ENCOUNTER — Other Ambulatory Visit: Payer: Self-pay

## 2018-01-06 ENCOUNTER — Telehealth: Payer: Self-pay | Admitting: *Deleted

## 2018-01-06 ENCOUNTER — Encounter (HOSPITAL_COMMUNITY): Payer: Self-pay

## 2018-01-06 DIAGNOSIS — K529 Noninfective gastroenteritis and colitis, unspecified: Secondary | ICD-10-CM

## 2018-01-06 DIAGNOSIS — O10919 Unspecified pre-existing hypertension complicating pregnancy, unspecified trimester: Secondary | ICD-10-CM

## 2018-01-06 DIAGNOSIS — O479 False labor, unspecified: Secondary | ICD-10-CM

## 2018-01-06 DIAGNOSIS — O10913 Unspecified pre-existing hypertension complicating pregnancy, third trimester: Secondary | ICD-10-CM | POA: Diagnosis not present

## 2018-01-06 DIAGNOSIS — R519 Headache, unspecified: Secondary | ICD-10-CM

## 2018-01-06 DIAGNOSIS — O4703 False labor before 37 completed weeks of gestation, third trimester: Secondary | ICD-10-CM

## 2018-01-06 DIAGNOSIS — Z3A33 33 weeks gestation of pregnancy: Secondary | ICD-10-CM

## 2018-01-06 DIAGNOSIS — O26893 Other specified pregnancy related conditions, third trimester: Secondary | ICD-10-CM | POA: Diagnosis not present

## 2018-01-06 DIAGNOSIS — O4700 False labor before 37 completed weeks of gestation, unspecified trimester: Secondary | ICD-10-CM

## 2018-01-06 DIAGNOSIS — R51 Headache: Secondary | ICD-10-CM | POA: Diagnosis not present

## 2018-01-06 LAB — COMPREHENSIVE METABOLIC PANEL
ALK PHOS: 119 U/L (ref 38–126)
ALT: 6 U/L (ref 0–44)
AST: 14 U/L — ABNORMAL LOW (ref 15–41)
Albumin: 2.9 g/dL — ABNORMAL LOW (ref 3.5–5.0)
Anion gap: 9 (ref 5–15)
BUN: <5 mg/dL — ABNORMAL LOW (ref 6–20)
CO2: 21 mmol/L — AB (ref 22–32)
Calcium: 8.3 mg/dL — ABNORMAL LOW (ref 8.9–10.3)
Chloride: 103 mmol/L (ref 98–111)
Creatinine, Ser: 0.47 mg/dL (ref 0.44–1.00)
GFR calc non Af Amer: >60 mL/min (ref >60)
Glucose, Bld: 79 mg/dL (ref 70–99)
POTASSIUM: 3.2 mmol/L — AB (ref 3.5–5.1)
SODIUM: 133 mmol/L — AB (ref 135–145)
TOTAL PROTEIN: 6.5 g/dL (ref 6.5–8.1)
Total Bilirubin: 0.9 mg/dL (ref 0.3–1.2)

## 2018-01-06 LAB — CBC
HCT: 32.1 % — ABNORMAL LOW (ref 36.0–46.0)
HEMOGLOBIN: 9.8 g/dL — AB (ref 12.0–15.0)
MCH: 26 pg (ref 26.0–34.0)
MCHC: 30.5 g/dL (ref 30.0–36.0)
MCV: 85.1 fL (ref 80.0–100.0)
Platelets: 161 10*3/uL (ref 150–400)
RBC: 3.77 MIL/uL — ABNORMAL LOW (ref 3.87–5.11)
RDW: 18.2 % — AB (ref 11.5–15.5)
WBC: 9.5 10*3/uL (ref 4.0–10.5)
nRBC: 0 % (ref 0.0–0.2)

## 2018-01-06 LAB — PROTEIN / CREATININE RATIO, URINE
CREATININE, URINE: 188 mg/dL
PROTEIN CREATININE RATIO: 0.11 mg/mg{creat} (ref 0.00–0.15)
Total Protein, Urine: 21 mg/dL

## 2018-01-06 LAB — FETAL FIBRONECTIN: Fetal Fibronectin: NEGATIVE

## 2018-01-06 MED ORDER — SODIUM CHLORIDE 0.9 % IV SOLN: INTRAVENOUS | Status: DC

## 2018-01-06 MED ORDER — PROMETHAZINE HCL 25 MG/ML IJ SOLN
12.5000 mg | Freq: Once | INTRAMUSCULAR | Status: AC
  Administered 2018-01-06: 12.5 mg via INTRAVENOUS
  Filled 2018-01-06: qty 1

## 2018-01-06 MED ORDER — BUTALBITAL-APAP-CAFFEINE 50-325-40 MG PO TABS
1.0000 | ORAL_TABLET | Freq: Once | ORAL | Status: AC
  Administered 2018-01-06: 1 via ORAL
  Filled 2018-01-06: qty 1

## 2018-01-06 MED ORDER — LACTATED RINGERS IV SOLN
INTRAVENOUS | Status: DC
  Administered 2018-01-06: 02:00:00 via INTRAVENOUS

## 2018-01-06 MED ORDER — NIFEDIPINE 10 MG PO CAPS
10.0000 mg | ORAL_CAPSULE | ORAL | Status: DC | PRN
  Administered 2018-01-06: 10 mg via ORAL
  Filled 2018-01-06: qty 1

## 2018-01-06 NOTE — Telephone Encounter (Signed)
-----   Message from [name], CNM sent at 01/06/2018  3:59 AM EST ----- Regarding: Diabetes notification I told her about her diabetes diagnosis Told her to call you today to arrange appt wit Nutrition  She says her phone cant receive calls, only texts  That is why  yall couldn't get through to her  Monique LiasMarie

## 2018-01-06 NOTE — Discharge Instructions (Signed)
Viral Gastroenteritis, Adult Viral gastroenteritis is also known as the stomach flu. This condition is caused by certain germs (viruses). These germs can be passed from person to person very easily (are very contagious). This condition can cause sudden watery poop (diarrhea), fever, and throwing up (vomiting). Having watery poop and throwing up can make you feel weak and cause you to get dehydrated. Dehydration can make you tired and thirsty, make you have a dry mouth, and make it so you pee (urinate) less often. Older adults and people with other diseases or a weak defense system (immune system) are at higher risk for dehydration. It is important to replace the fluids that you lose from having watery poop and throwing up. Follow these instructions at home: Follow instructions from your doctor about how to care for yourself at home. Eating and drinking  Follow these instructions as told by your doctor:  Take an oral rehydration solution (ORS). This is a drink that is sold at pharmacies and stores.  Drink clear fluids in small amounts as you are able, such as: ? Water. ? Ice chips. ? Diluted fruit juice. ? Low-calorie sports drinks.  Eat bland, easy-to-digest foods in small amounts as you are able, such as: ? Bananas. ? Applesauce. ? Rice. ? Low-fat (lean) meats. ? Toast. ? Crackers.  Avoid fluids that have a lot of sugar or caffeine in them.  Avoid alcohol.  Avoid spicy or fatty foods.  General instructions  Drink enough fluid to keep your pee (urine) clear or pale yellow.  Wash your hands often. If you cannot use soap and water, use hand sanitizer.  Make sure that all people in your home wash their hands well and often.  Rest at home while you get better.  Take over-the-counter and prescription medicines only as told by your doctor.  Watch your condition for any changes.  Take a warm bath to help with any burning or pain from having watery poop.  Keep all follow-up  visits as told by your doctor. This is important. Contact a doctor if:  You cannot keep fluids down.  Your symptoms get worse.  You have new symptoms.  You feel light-headed or dizzy.  You have muscle cramps. Get help right away if:  You have chest pain.  You feel very weak or you pass out (faint).  You see blood in your throw-up.  Your throw-up looks like coffee grounds.  You have bloody or black poop (stools) or poop that look like tar.  You have a very bad headache, a stiff neck, or both.  You have a rash.  You have very bad pain, cramping, or bloating in your belly (abdomen).  You have trouble breathing.  You are breathing very quickly.  Your heart is beating very quickly.  Your skin feels cold and clammy.  You feel confused.  You have pain when you pee.  You have signs of dehydration, such as: ? Dark pee, hardly any pee, or no pee. ? Cracked lips. ? Dry mouth. ? Sunken eyes. ? Sleepiness. ? Weakness. This information is not intended to replace advice given to you by your health care provider. Make sure you discuss any questions you have with your health care provider. Document Released: 07/22/2007 Document Revised: 08/23/2015 Document Reviewed: 10/09/2014 Elsevier Interactive Patient Education  2017 [employer] General Headache Without Cause A headache is pain or discomfort felt around the head or neck area. There are many causes and types of headaches. In some cases, the  cause may not be found. Follow these instructions at home: Managing pain  Take over-the-counter and prescription medicines only as told by your doctor.  Lie down in a dark, quiet room when you have a headache.  If directed, apply ice to the head and neck area: ? Put ice in a plastic bag. ? Place a towel between your skin and the bag. ? Leave the ice on for 20 minutes, 2-3 times per day.  Use a heating pad or hot shower to apply heat to the head and neck area as told by your  doctor.  Keep lights dim if bright lights bother you or make your headaches worse. Eating and drinking  Eat meals on a regular schedule.  Lessen how much alcohol you drink.  Lessen how much caffeine you drink, or stop drinking caffeine. General instructions  Keep all follow-up visits as told by your doctor. This is important.  Keep a journal to find out if certain things bring on headaches. For example, write down: ? What you eat and drink. ? How much sleep you get. ? Any change to your diet or medicines.  Relax by getting a massage or doing other relaxing activities.  Lessen stress.  Sit up straight. Do not tighten (tense) your muscles.  Do not use tobacco products. This includes cigarettes, chewing tobacco, or e-cigarettes. If you need help quitting, ask your doctor.  Exercise regularly as told by your doctor.  Get enough sleep. This often means 7-9 hours of sleep. Contact a doctor if:  Your symptoms are not helped by medicine.  You have a headache that feels different than the other headaches.  You feel sick to your stomach (nauseous) or you throw up (vomit).  You have a fever. Get help right away if:  Your headache becomes really bad.  You keep throwing up.  You have a stiff neck.  You have trouble seeing.  You have trouble speaking.  You have pain in the eye or ear.  Your muscles are weak or you lose muscle control.  You lose your balance or have trouble walking.  You feel like you will pass out (faint) or you pass out.  You have confusion. This information is not intended to replace advice given to you by your health care provider. Make sure you discuss any questions you have with your health care provider. Document Released: 11/12/2007 Document Revised: 07/11/2015 Document Reviewed: 05/28/2014 Elsevier Interactive Patient Education  2018 [employer] Gestational Diabetes Mellitus, Diagnosis Gestational diabetes (gestational diabetes mellitus)  is a short-term (temporary) form of diabetes that can happen during pregnancy. It goes away after you give birth. It may be caused by one or both of these problems:  Your body does not make enough of a hormone called insulin.  Your body does not respond in a normal way to insulin that it makes.  Insulin lets sugars (glucose) go into cells in the body. This gives you energy. If you have diabetes, sugars cannot get into cells. This causes high blood sugar (hyperglycemia). If diabetes is treated, it may not hurt you or your baby. Your doctor will set treatment goals for you. In general, you should have these blood sugar levels:  After not eating for a long time (fasting): 95 mg/dL (5.3 mmol/L).  After meals (postprandial): ? One hour after a meal: at or below 140 mg/dL (7.8 mmol/L). ? Two hours after a meal: at or below 120 mg/dL (6.7 mmol/L).  A1c (hemoglobin A1c) level: 6-6.5%.  Follow  these instructions at home: Questions to Ask Your Doctor  You may want to ask these questions:  Do I need to meet with a diabetes educator?  Where can I find a support group for people with diabetes?  What equipment will I need to care for myself at home?  What diabetes medicines do I need? When should I take them?  How often do I need to check my blood sugar?  What number can I call if I have questions?  When is my next doctor's visit?  General instructions  Take over-the-counter and prescription medicines only as told by your doctor.  Stay at a healthy weight during pregnancy.  Keep all follow-up visits as told by your doctor. This is important. Contact a doctor if:  Your blood sugar is at or above 240 mg/dL (40.9 mmol/L).  Your blood sugar is at or above 200 mg/dL (81.1 mmol/L) and you have ketones in your pee (urine).  You have been sick or have had a fever for 2 days or more and you are not getting better.  You have any of these problems for more than 6 hours: ? You cannot eat or  drink. ? You feel sick to your stomach (nauseous). ? You throw up (vomit). ? You have watery poop (diarrhea). Get help right away if:  Your blood sugar is lower than 54 mg/dL (3 mmol/L).  You get confused.  You have trouble: ? Thinking clearly. ? Breathing.  Your baby moves less than normal.  You have: ? Moderate or large ketone levels in your pee (urine). ? Bleeding from your vagina. ? Unusual fluid coming from your vagina. ? Early contractions. These may feel like tightness in your belly. This information is not intended to replace advice given to you by your health care provider. Make sure you discuss any questions you have with your health care provider. Document Released: 05/27/2015 Document Revised: 07/11/2015 Document Reviewed: 03/08/2015 Elsevier Interactive Patient Education  [employer].

## 2018-01-06 NOTE — Telephone Encounter (Signed)
Will send Mychart message to pt. Re: needing GDM appt.

## 2018-01-06 NOTE — MAU Provider Note (Signed)
Chief Complaint:  Headache and Hypertension   First Provider Initiated Contact with Patient 01/06/18 0052      HPI: Monique Gamble is a 27 y.o. [id number] at 33w3dwho presents to maternity admissions reporting headache and seeing spots this evening.  Has a history of chronic hypertension on Labetalol.  . Also reports she has not eaten or had anything to drink all day due to nausea, vomiting and diarrhea.   She reports good fetal movement, denies LOF, vaginal bleeding, vaginal itching/burning, urinary symptoms,  dizziness, constipation or fever/chills.  She denies headache, visual changes or RUQ abdominal pain.  Headache   This is a recurrent problem. The current episode started today. The pain does not radiate. The pain quality is similar to prior headaches. The quality of the pain is described as aching. Associated symptoms include nausea, a visual change and vomiting. Pertinent negatives include no abdominal pain, back pain, blurred vision, dizziness, fever or photophobia. She has tried nothing for the symptoms. Her past medical history is significant for hypertension.  Hypertension  This is a chronic problem. The problem is unchanged. Associated symptoms include headaches. Pertinent negatives include no blurred vision, peripheral edema or shortness of breath. There are no associated agents to hypertension. Treatments tried: Labetalol. There are no compliance problems.   Emesis   This is a new problem. The current episode started today. The problem occurs 5 to 10 times per day. The problem has been unchanged. There has been no fever. Associated symptoms include headaches. Pertinent negatives include no abdominal pain, dizziness or fever. She has tried nothing for the symptoms.    RN Note: Pt arriving EMS. Pt states that she started seeing spots an hour ago.  Pt reports a headache that started an hour ago.  Reports vomiting and diarrhea. She states she is unable to keep anything down  Past  Medical History: Past Medical History:  Diagnosis Date  . ADHD (attention deficit hyperactivity disorder)   . Chlamydia   . Headache   . History of anemia   . Hypertension   . Pregnancy induced hypertension     Past obstetric history: OB History  Gravida Para Term Preterm AB Living  8 5 5  0 2 5  SAB TAB Ectopic Multiple Live Births  1 1 0 0 5    # Outcome Date GA Lbr Len/2nd Weight Sex Delivery Anes PTL Lv  8 Current           7 SAB 04/2017          6 Term 08/22/16 [redacted]w[redacted]d 429:40 / 00:07 3110 g M Vag-Spont None  LIV  5 Term 01/21/15 [redacted]w[redacted]d 02:38 / 00:03 2807 g M Vag-Spont None  LIV  4 Term 03/15/14 [redacted]w[redacted]d 03:15 / 00:02 3195 g M Vag-Spont None  LIV  3 Term 08/15/11 [redacted]w[redacted]d 01:38 / 00:14 3065 g F Vag-Spont None  LIV  2 Term 2009 [redacted]w[redacted]d  2977 g F Vag-Spont   LIV  1 TAB 2005            Past Surgical History: Past Surgical History:  Procedure Laterality Date  . INDUCED ABORTION      Family History: Family History  Problem Relation Age of Onset  . Hypertension Mother   . Hypertension Father   . Diabetes Sister   . Anesthesia problems Neg Hx     Social History: Social History   Tobacco Use  . Smoking status: Never Smoker  . Smokeless tobacco: Never Used  Substance Use Topics  .  Alcohol use: No  . Drug use: Not Currently    Types: Marijuana    Comment: last use 03 Jul 2017    Allergies: No Known Allergies  Meds:  Medications Prior to Admission  Medication Sig Dispense Refill Last Dose  . acetaminophen (TYLENOL) 500 MG tablet Take 500 mg by mouth every 6 (six) hours as needed for mild pain or moderate pain.   Taking  . aspirin EC 81 MG tablet Take 1 tablet (81 mg total) by mouth daily. Take after 12 weeks for prevention of preeclampsia later in pregnancy 300 tablet 2 Taking  . butalbital-acetaminophen-caffeine (FIORICET, ESGIC) 50-325-40 MG tablet Take 1 tablet by mouth every 6 (six) hours as needed for headache. 10 tablet 0   . cyclobenzaprine (FLEXERIL) 10 MG tablet  Take 1 tablet (10 mg total) by mouth 3 (three) times daily as needed for muscle spasms. 30 tablet 3 Taking  . Elastic Bandages & Supports (COMFORT FIT MATERNITY SUPP SM) MISC 1 Units by Does not apply route daily as needed. (Patient not taking: Reported on 12/20/2017) 1 each 0 Not Taking  . Iron Polysacch Cmplx-B12-FA 150-0.025-1 MG CAPS Take 1 tablet by mouth daily. (Patient not taking: Reported on 12/20/2017) 30 each 2 Not Taking  . NIFEdipine (ADALAT CC) 30 MG 24 hr tablet Take 1 tablet (30 mg total) by mouth daily. 30 tablet 2 Taking  . ondansetron (ZOFRAN ODT) 4 MG disintegrating tablet Take 1 tablet (4 mg total) by mouth every 8 (eight) hours as needed for nausea or vomiting. 30 tablet 0 Taking  . promethazine (PHENERGAN) 25 MG tablet Take 1 tablet (25 mg total) by mouth every 6 (six) hours as needed for nausea or vomiting. 30 tablet 3 Taking    I have reviewed patient's Past Medical Hx, Surgical Hx, Family Hx, Social Hx, medications and allergies.   ROS:  Review of Systems  Constitutional: Negative for fever.  Eyes: Negative for blurred vision and photophobia.  Respiratory: Negative for shortness of breath.   Gastrointestinal: Positive for nausea and vomiting. Negative for abdominal pain.  Musculoskeletal: Negative for back pain.  Neurological: Positive for headaches. Negative for dizziness.   Other systems negative  Physical Exam   Patient Vitals for the past 24 hrs:  BP Temp Pulse Resp SpO2  01/06/18 0046 129/80 98.4 F (36.9 C) 90 20 100 %   Constitutional: Well-developed, well-nourished female in no acute distress.  Cardiovascular: normal rate and rhythm Respiratory: normal effort, clear to auscultation bilaterally GI: Abd soft, non-tender, gravid appropriate for gestational age.   No rebound or guarding. MS: Extremities nontender, NO edema, normal ROM Neurologic: Alert and oriented x 4. DTRs 1+/ no clonus GU: Neg CVAT.  PELVIC EXAM: deferred   FHT:  Baseline 140 ,  moderate variability, accelerations present, no decelerations Contractions:  Irregular     Labs: O/Positive/-- (09/12 1448) Results for orders placed or performed during the hospital encounter of 01/06/18 (from the past 24 hour(s))  CBC     Status: Abnormal   Collection Time: 01/06/18  1:05 AM  Result Value Ref Range   WBC 9.5 4.0 - 10.5 K/uL   RBC 3.77 (L) 3.87 - 5.11 MIL/uL   Hemoglobin 9.8 (L) 12.0 - 15.0 g/dL   HCT 16.1 (L) 09.6 - 04.5 %   MCV 85.1 80.0 - 100.0 fL   MCH 26.0 26.0 - 34.0 pg   MCHC 30.5 30.0 - 36.0 g/dL   RDW 40.9 (H) 81.1 - 91.4 %   Platelets  161 150 - 400 K/uL   nRBC 0.0 0.0 - 0.2 %  Comprehensive metabolic panel     Status: Abnormal   Collection Time: 01/06/18  1:05 AM  Result Value Ref Range   Sodium 133 (L) 135 - 145 mmol/L   Potassium 3.2 (L) 3.5 - 5.1 mmol/L   Chloride 103 98 - 111 mmol/L   CO2 21 (L) 22 - 32 mmol/L   Glucose, Bld 79 70 - 99 mg/dL   BUN <5 (L) 6 - 20 mg/dL   Creatinine, Ser 1.610.47 0.44 - 1.00 mg/dL   Calcium 8.3 (L) 8.9 - 10.3 mg/dL   Total Protein 6.5 6.5 - 8.1 g/dL   Albumin 2.9 (L) 3.5 - 5.0 g/dL   AST 14 (L) 15 - 41 U/L   ALT 6 0 - 44 U/L   Alkaline Phosphatase 119 38 - 126 U/L   Total Bilirubin 0.9 0.3 - 1.2 mg/dL   GFR calc non Af Amer >60 >60 mL/min   GFR calc Af Amer >60 >60 mL/min   Anion gap 9 5 - 15  Protein / creatinine ratio, urine     Status: None   Collection Time: 01/06/18  2:41 AM  Result Value Ref Range   Creatinine, Urine 188.00 mg/dL   Total Protein, Urine 21 mg/dL   Protein Creatinine Ratio 0.11 0.00 - 0.15 mg/mg[Cre]  Fetal fibronectin     Status: None   Collection Time: 01/06/18  3:24 AM  Result Value Ref Range   Fetal Fibronectin NEGATIVE NEGATIVE    Imaging:  No results found.  MAU Course/MDM: I have ordered labs and reviewed results. Preeclampsia labs are normal   Fetal fibronectin is negative NST reviewed, reactive.  Irregular contractions described as painful by pt.  Procardia given.  After  first dose, contractions mostly stopped   Since FFn was negative dose was not repeated  Fioricet given for headache with good relief    Assessment: Single intrauterine pregnancy at 6055w3d Headache and visual changes resolved Normotensive while here Uterine contractions resolved with one dose of PRocardia, negative Fetal fibronectin   Slight change in cx  Plan: Discharge home Preterm Labor precautions and fetal kick counts Follow up in Office for prenatal visits and recheck of cervix Preeclampsia precautions Has rx at home for Fioricet (chronic headaches) Encouraged to return here or to other Urgent Care/ED if she develops worsening of symptoms, increase in pain, fever, or other concerning symptoms.   Pt stable at time of discharge.  Wynelle BourgeoisMarie Earnestine Shipp CNM, MSN Certified Nurse-Midwife 01/06/2018 12:52 AM

## 2018-01-06 NOTE — MAU Note (Signed)
Pt arriving EMS. Pt states that she started seeing spots an hour ago.   Pt reports a headache that started an hour ago.   Reports vomiting and diarrhea. She states she is unable to keep anything down.

## 2018-01-09 ENCOUNTER — Inpatient Hospital Stay (HOSPITAL_COMMUNITY)
Admission: AD | Admit: 2018-01-09 | Discharge: 2018-01-10 | Disposition: A | Discharge status: Home / Self Care | Payer: Medicaid Other | Source: Ambulatory Visit | Attending: Obstetrics and Gynecology | Admitting: Obstetrics and Gynecology

## 2018-01-09 DIAGNOSIS — Z833 Family history of diabetes mellitus: Secondary | ICD-10-CM | POA: Insufficient documentation

## 2018-01-09 DIAGNOSIS — O10013 Pre-existing essential hypertension complicating pregnancy, third trimester: Secondary | ICD-10-CM | POA: Insufficient documentation

## 2018-01-09 DIAGNOSIS — O99343 Other mental disorders complicating pregnancy, third trimester: Secondary | ICD-10-CM | POA: Insufficient documentation

## 2018-01-09 DIAGNOSIS — Z7982 Long term (current) use of aspirin: Secondary | ICD-10-CM | POA: Insufficient documentation

## 2018-01-09 DIAGNOSIS — Z3A34 34 weeks gestation of pregnancy: Secondary | ICD-10-CM | POA: Insufficient documentation

## 2018-01-09 DIAGNOSIS — O26893 Other specified pregnancy related conditions, third trimester: Secondary | ICD-10-CM

## 2018-01-09 DIAGNOSIS — O479 False labor, unspecified: Secondary | ICD-10-CM

## 2018-01-09 DIAGNOSIS — O4703 False labor before 37 completed weeks of gestation, third trimester: Secondary | ICD-10-CM | POA: Insufficient documentation

## 2018-01-09 DIAGNOSIS — Z79899 Other long term (current) drug therapy: Secondary | ICD-10-CM | POA: Insufficient documentation

## 2018-01-09 DIAGNOSIS — Z8249 Family history of ischemic heart disease and other diseases of the circulatory system: Secondary | ICD-10-CM | POA: Insufficient documentation

## 2018-01-09 DIAGNOSIS — F909 Attention-deficit hyperactivity disorder, unspecified type: Secondary | ICD-10-CM | POA: Insufficient documentation

## 2018-01-09 DIAGNOSIS — N898 Other specified noninflammatory disorders of vagina: Secondary | ICD-10-CM | POA: Insufficient documentation

## 2018-01-09 HISTORY — DX: Gestational diabetes mellitus in pregnancy, unspecified control: O24.419

## 2018-01-10 ENCOUNTER — Encounter (HOSPITAL_COMMUNITY): Payer: Self-pay

## 2018-01-10 DIAGNOSIS — O26893 Other specified pregnancy related conditions, third trimester: Secondary | ICD-10-CM

## 2018-01-10 DIAGNOSIS — Z3A34 34 weeks gestation of pregnancy: Secondary | ICD-10-CM

## 2018-01-10 DIAGNOSIS — Z7982 Long term (current) use of aspirin: Secondary | ICD-10-CM | POA: Diagnosis not present

## 2018-01-10 DIAGNOSIS — Z79899 Other long term (current) drug therapy: Secondary | ICD-10-CM | POA: Diagnosis not present

## 2018-01-10 DIAGNOSIS — O4703 False labor before 37 completed weeks of gestation, third trimester: Secondary | ICD-10-CM | POA: Diagnosis not present

## 2018-01-10 DIAGNOSIS — N898 Other specified noninflammatory disorders of vagina: Secondary | ICD-10-CM

## 2018-01-10 DIAGNOSIS — O10013 Pre-existing essential hypertension complicating pregnancy, third trimester: Secondary | ICD-10-CM | POA: Diagnosis not present

## 2018-01-10 DIAGNOSIS — O99343 Other mental disorders complicating pregnancy, third trimester: Secondary | ICD-10-CM | POA: Diagnosis not present

## 2018-01-10 DIAGNOSIS — Z8249 Family history of ischemic heart disease and other diseases of the circulatory system: Secondary | ICD-10-CM | POA: Diagnosis not present

## 2018-01-10 DIAGNOSIS — F909 Attention-deficit hyperactivity disorder, unspecified type: Secondary | ICD-10-CM | POA: Diagnosis not present

## 2018-01-10 DIAGNOSIS — Z833 Family history of diabetes mellitus: Secondary | ICD-10-CM | POA: Diagnosis not present

## 2018-01-10 LAB — URINALYSIS, ROUTINE W REFLEX MICROSCOPIC
BILIRUBIN URINE: NEGATIVE
Glucose, UA: NEGATIVE mg/dL
HGB URINE DIPSTICK: NEGATIVE
KETONES UR: NEGATIVE mg/dL
Leukocytes, UA: NEGATIVE
NITRITE: NEGATIVE
PH: 6 (ref 5.0–8.0)
Protein, ur: NEGATIVE mg/dL
Specific Gravity, Urine: 1.02 (ref 1.005–1.030)

## 2018-01-10 NOTE — MAU Provider Note (Signed)
History     CSN: [id number]  Arrival date and time: 01/09/18 2354   First Provider Initiated Contact with Patient 01/10/18 0021      Chief Complaint  Patient presents with  . Vaginal Discharge  . Contractions   HPI  Monique Gamble is a 27 y.o. [id number] at [redacted]w[redacted]d who presents with abdominal cramping. She states she lost her mucous plug about 30 minutes ago and felt some intermittent cramping. She states she is unable to time them and they are not painful. She denies any leaking or bleeding. Reports normal fetal movement.   OB History    Gravida  8   Para  5   Term  5   Preterm  0   AB  2   Living  5     SAB  1   TAB  1   Ectopic  0   Multiple  0   Live Births  5           Past Medical History:  Diagnosis Date  . ADHD (attention deficit hyperactivity disorder)   . Chlamydia   . Gestational diabetes   . Headache   . History of anemia   . Hypertension   . Pregnancy induced hypertension     Past Surgical History:  Procedure Laterality Date  . INDUCED ABORTION      Family History  Problem Relation Age of Onset  . Hypertension Mother   . Hypertension Father   . Diabetes Sister   . Anesthesia problems Neg Hx     Social History   Tobacco Use  . Smoking status: Never Smoker  . Smokeless tobacco: Never Used  Substance Use Topics  . Alcohol use: No  . Drug use: Not Currently    Types: Marijuana    Comment: last use 03 Jul 2017    Allergies: No Known Allergies  Medications Prior to Admission  Medication Sig Dispense Refill Last Dose  . butalbital-acetaminophen-caffeine (FIORICET, ESGIC) 50-325-40 MG tablet Take 1 tablet by mouth every 6 (six) hours as needed for headache. 10 tablet 0 01/10/2018 at Unknown time  . acetaminophen (TYLENOL) 500 MG tablet Take 500 mg by mouth every 6 (six) hours as needed for mild pain or moderate pain.   Taking  . aspirin EC 81 MG tablet Take 1 tablet (81 mg total) by mouth daily. Take after 12 weeks for  prevention of preeclampsia later in pregnancy 300 tablet 2 Taking  . cyclobenzaprine (FLEXERIL) 10 MG tablet Take 1 tablet (10 mg total) by mouth 3 (three) times daily as needed for muscle spasms. 30 tablet 3 Taking  . Elastic Bandages & Supports (COMFORT FIT MATERNITY SUPP SM) MISC 1 Units by Does not apply route daily as needed. (Patient not taking: Reported on 12/20/2017) 1 each 0 Not Taking  . Iron Polysacch Cmplx-B12-FA 150-0.025-1 MG CAPS Take 1 tablet by mouth daily. (Patient not taking: Reported on 12/20/2017) 30 each 2 Not Taking  . NIFEdipine (ADALAT CC) 30 MG 24 hr tablet Take 1 tablet (30 mg total) by mouth daily. 30 tablet 2 Taking  . ondansetron (ZOFRAN ODT) 4 MG disintegrating tablet Take 1 tablet (4 mg total) by mouth every 8 (eight) hours as needed for nausea or vomiting. 30 tablet 0 Taking  . promethazine (PHENERGAN) 25 MG tablet Take 1 tablet (25 mg total) by mouth every 6 (six) hours as needed for nausea or vomiting. 30 tablet 3 Taking    Review of Systems  Constitutional: Negative.  Negative for fatigue and fever.  HENT: Negative.   Respiratory: Negative.  Negative for shortness of breath.   Cardiovascular: Negative.  Negative for chest pain.  Gastrointestinal: Positive for abdominal pain. Negative for constipation, diarrhea, nausea and vomiting.  Genitourinary: Positive for vaginal discharge. Negative for dysuria and vaginal bleeding.  Neurological: Negative.  Negative for dizziness and headaches.   Physical Exam   Blood pressure 135/74, pulse (!) 109, temperature 98.2 F (36.8 C), temperature source Oral, resp. rate 20, height 5\' 6"  (1.676 m), weight 84.8 kg, last menstrual period 05/17/2017, not currently breastfeeding.  Physical Exam  Nursing note and vitals reviewed. Constitutional: She is oriented to person, place, and time. She appears well-developed and well-nourished. No distress.  HENT:  Head: Normocephalic.  Eyes: Pupils are equal, round, and reactive to  light.  Cardiovascular: Normal rate, regular rhythm and normal heart sounds.  Respiratory: Effort normal and breath sounds normal. No respiratory distress.  GI: Soft. Bowel sounds are normal. She exhibits no distension. There is no tenderness.  Neurological: She is alert and oriented to person, place, and time.  Skin: Skin is warm and dry.  Psychiatric: She has a normal mood and affect. Her behavior is normal. Judgment and thought content normal.    Fetal Tracing:  Baseline: 130 Variability: moderate Accels: 15x15 Decels: one variable while cervix was examined  Toco: none  Dilation: Closed Effacement (%): Thick Cervical Position: Posterior Exam by:: Cleone Slimaroline Makyah Lavigne, CNM  MAU Course  Procedures Results for orders placed or performed during the hospital encounter of 01/09/18 (from the past 24 hour(s))  Urinalysis, Routine w reflex microscopic     Status: None   Collection Time: 01/10/18  1:01 AM  Result Value Ref Range   Color, Urine YELLOW YELLOW   APPearance CLEAR CLEAR   Specific Gravity, Urine 1.020 1.005 - 1.030   pH 6.0 5.0 - 8.0   Glucose, UA NEGATIVE NEGATIVE mg/dL   Hgb urine dipstick NEGATIVE NEGATIVE   Bilirubin Urine NEGATIVE NEGATIVE   Ketones, ur NEGATIVE NEGATIVE mg/dL   Protein, ur NEGATIVE NEGATIVE mg/dL   Nitrite NEGATIVE NEGATIVE   Leukocytes, UA NEGATIVE NEGATIVE   MDM UA NST No signs or symptoms of preterm labor at this time.  Assessment and Plan   1. Vaginal discharge during pregnancy in third trimester   2. Braxton Hick's contraction    -Discharge home in stable condition -Preterm labor precautions discussed -Patient [facility] -Patient may return to MAU as needed or if her condition were to change or worsen   Rolm BookbinderCaroline M Makinzee Durley CNM 01/10/2018, 12:21 AM

## 2018-01-10 NOTE — Discharge Instructions (Signed)
Braxton Hicks Contractions °Contractions of the uterus can occur throughout pregnancy, but they are not always a sign that you are in labor. You may have practice contractions called Braxton Hicks contractions. These false labor contractions are sometimes confused with true labor. °What are Braxton Hicks contractions? °Braxton Hicks contractions are tightening movements that occur in the muscles of the uterus before labor. Unlike true labor contractions, these contractions do not result in opening (dilation) and thinning of the cervix. Toward the end of pregnancy (32-34 weeks), Braxton Hicks contractions can happen more often and may become stronger. These contractions are sometimes difficult to tell apart from true labor because they can be very uncomfortable. You should not feel embarrassed if you go to the hospital with false labor. °Sometimes, the only way to tell if you are in true labor is for your health care provider to look for changes in the cervix. The health care provider will do a physical exam and may monitor your contractions. If you are not in true labor, the exam should show that your cervix is not dilating and your water has not broken. °If there are other health problems associated with your pregnancy, it is completely safe for you to be sent home with false labor. You may continue to have Braxton Hicks contractions until you go into true labor. °How to tell the difference between true labor and false labor °True labor °· Contractions last 30-70 seconds. °· Contractions become very regular. °· Discomfort is usually felt in the top of the uterus, and it spreads to the lower abdomen and low back. °· Contractions do not go away with walking. °· Contractions usually become more intense and increase in frequency. °· The cervix dilates and gets thinner. °False labor °· Contractions are usually shorter and not as strong as true labor contractions. °· Contractions are usually irregular. °· Contractions  are often felt in the front of the lower abdomen and in the groin. °· Contractions may go away when you walk around or change positions while lying down. °· Contractions get weaker and are shorter-lasting as time goes on. °· The cervix usually does not dilate or become thin. °Follow these instructions at home: °· Take over-the-counter and prescription medicines only as told by your health care provider. °· Keep up with your usual exercises and follow other instructions from your health care provider. °· Eat and drink lightly if you think you are going into labor. °· If Braxton Hicks contractions are making you uncomfortable: °? Change your position from lying down or resting to walking, or change from walking to resting. °? Sit and rest in a tub of warm water. °? Drink enough fluid to keep your urine pale yellow. Dehydration may cause these contractions. °? Do slow and deep breathing several times an hour. °· Keep all follow-up prenatal visits as told by your health care provider. This is important. °Contact a health care provider if: °· You have a fever. °· You have continuous pain in your abdomen. °Get help right away if: °· Your contractions become stronger, more regular, and closer together. °· You have fluid leaking or gushing from your vagina. °· You pass blood-tinged mucus (bloody show). °· You have bleeding from your vagina. °· You have low back pain that you never had before. °· You feel your baby’s head pushing down and causing pelvic pressure. °· Your baby is not moving inside you as much as it used to. °Summary °· Contractions that occur before labor are called Braxton   Hicks contractions, false labor, or practice contractions. °· Braxton Hicks contractions are usually shorter, weaker, farther apart, and less regular than true labor contractions. True labor contractions usually become progressively stronger and regular and they become more frequent. °· Manage discomfort from Braxton Hicks contractions by  changing position, resting in a warm bath, drinking plenty of water, or practicing deep breathing. °This information is not intended to replace advice given to you by your health care provider. Make sure you discuss any questions you have with your health care provider. °Document Released: 06/18/2016 Document Revised: 06/18/2016 Document Reviewed: 06/18/2016 °Elsevier Interactive Patient Education © 2018 [employer] ° °

## 2018-01-10 NOTE — MAU Note (Signed)
Pt reports her mucous plug came out prior to arrival. Pt denies LOF or vaginal bleeding. Reports occasional contractions. Reports good fetal movement.

## 2018-01-20 ENCOUNTER — Ambulatory Visit: Payer: Self-pay

## 2018-01-20 ENCOUNTER — Ambulatory Visit: Payer: Medicaid Other | Admitting: *Deleted

## 2018-01-20 ENCOUNTER — Other Ambulatory Visit (HOSPITAL_COMMUNITY)
Admission: RE | Admit: 2018-01-20 | Discharge: 2018-01-20 | Disposition: A | Discharge status: Home / Self Care | Payer: Medicaid Other | Source: Ambulatory Visit | Attending: Obstetrics & Gynecology | Admitting: Obstetrics & Gynecology

## 2018-01-20 ENCOUNTER — Ambulatory Visit (INDEPENDENT_AMBULATORY_CARE_PROVIDER_SITE_OTHER): Payer: Medicaid Other | Admitting: Obstetrics & Gynecology

## 2018-01-20 VITALS — BP 137/87 | HR 97 | Wt 189.0 lb

## 2018-01-20 DIAGNOSIS — O0993 Supervision of high risk pregnancy, unspecified, third trimester: Secondary | ICD-10-CM | POA: Insufficient documentation

## 2018-01-20 DIAGNOSIS — O99013 Anemia complicating pregnancy, third trimester: Secondary | ICD-10-CM

## 2018-01-20 DIAGNOSIS — O24419 Gestational diabetes mellitus in pregnancy, unspecified control: Secondary | ICD-10-CM | POA: Diagnosis not present

## 2018-01-20 DIAGNOSIS — O10913 Unspecified pre-existing hypertension complicating pregnancy, third trimester: Secondary | ICD-10-CM | POA: Diagnosis not present

## 2018-01-20 DIAGNOSIS — O10919 Unspecified pre-existing hypertension complicating pregnancy, unspecified trimester: Secondary | ICD-10-CM

## 2018-01-20 DIAGNOSIS — O09299 Supervision of pregnancy with other poor reproductive or obstetric history, unspecified trimester: Secondary | ICD-10-CM

## 2018-01-20 DIAGNOSIS — Z641 Problems related to multiparity: Secondary | ICD-10-CM | POA: Diagnosis not present

## 2018-01-20 DIAGNOSIS — O09293 Supervision of pregnancy with other poor reproductive or obstetric history, third trimester: Secondary | ICD-10-CM

## 2018-01-20 LAB — POCT URINALYSIS DIP (DEVICE)
BILIRUBIN URINE: NEGATIVE
Glucose, UA: NEGATIVE mg/dL
Hgb urine dipstick: NEGATIVE
Ketones, ur: NEGATIVE mg/dL
Nitrite: NEGATIVE
PH: 6.5 (ref 5.0–8.0)
Protein, ur: NEGATIVE mg/dL
SPECIFIC GRAVITY, URINE: 1.02 (ref 1.005–1.030)
Urobilinogen, UA: 1 mg/dL (ref 0.0–1.0)

## 2018-01-20 LAB — OB RESULTS CONSOLE GC/CHLAMYDIA: Gonorrhea: NEGATIVE

## 2018-01-20 LAB — OB RESULTS CONSOLE GBS: GBS: NEGATIVE

## 2018-01-20 MED ORDER — GLUCOSE BLOOD VI STRP: ORAL_STRIP | 12 refills | Status: DC

## 2018-01-20 MED ORDER — ACCU-CHEK GUIDE W/DEVICE KIT: 1.0000 | PACK | Freq: Once | 0 refills | Status: AC

## 2018-01-20 MED ORDER — ACCU-CHEK FASTCLIX LANCETS MISC: 1.0000 | Freq: Four times a day (QID) | 12 refills | Status: DC

## 2018-01-20 NOTE — Progress Notes (Signed)
   PRENATAL VISIT NOTE  Subjective:  Monique Gamble is a 27 y.o. [id number] at 6349w3d being seen today for ongoing prenatal care.  She is currently monitored for the following issues for this high-risk pregnancy and has Rubella non-immune status, antepartum; Supervision of high-risk pregnancy; H/O pre-eclampsia in prior pregnancy, currently pregnant; No prenatal care in current pregnancy; Anemia in pregnancy; Chronic hypertension during pregnancy, antepartum; GDM (gestational diabetes mellitus); and Grand multipara on their problem list.  Patient reports no complaints.  Contractions: Irregular. Vag. Bleeding: None.  Movement: Present. Denies leaking of fluid.   The following portions of the patient's history were reviewed and updated as appropriate: allergies, current medications, past family history, past medical history, past social history, past surgical history and problem list. Problem list updated.  Objective:   Vitals:   01/20/18 0956 01/20/18 1016  BP: 140/89 137/87  Pulse: (!) 102 97  Weight: 187 lb (84.8 kg) 189 lb (85.7 kg)    Fetal Status: Fetal Heart Rate (bpm): nst Fundal Height: 34 cm Movement: Present  Presentation: Vertex  General:  Alert, oriented and cooperative. Patient is in no acute distress.  Skin: Skin is warm and dry. No rash noted.   Cardiovascular: Normal heart rate noted  Respiratory: Normal respiratory effort, no problems with respiration noted  Abdomen: Soft, gravid, appropriate for gestational age.  Pain/Pressure: Present     Pelvic: Cervical exam performed Dilation: 1.5 Effacement (%): 20    Extremities: Normal range of motion.  Edema: None  Mental Status: Normal mood and affect. Normal behavior. Normal judgment and thought content.   Assessment and Plan:  Pregnancy: [id number] at 4649w3d  1. Anemia during pregnancy in third trimester  2. Supervision of high risk pregnancy in third trimester  - Culture, beta strep (group b only) - GC[facility]Chlamydia probe  [facility]  3. H/O pre-eclampsia in prior pregnancy, currently pregnant   4. Gestational diabetes mellitus (GDM), antepartum, gestational diabetes method of control unspecified - HBA1C today - US FETAL BPP W/NONSTRESS; Future - Bev has agreed to see her today at 5pm  5. Chronic hypertension during pregnancy, antepartum - growth u/s to be scheduled asap  6. Grand multipara   Preterm labor symptoms and general obstetric precautions including but not limited to vaginal bleeding, contractions, leaking of fluid and fetal movement were reviewed in detail with the patient. Please refer to After Visit Summary for other counseling recommendations.  Return in about 1 week (around 01/27/2018).  No future appointments.  Monique BossierMyra C Linkyn Gobin, MD

## 2018-01-20 NOTE — Progress Notes (Signed)
  Patient was seen on 01/20/2018 for Gestational Diabetes self-management. EDD 02/21/2018.She is here with significant other who participated in the visit with his knowledge of diabetes from his brother who he states has Type 1 diabetes.  Patient states no history of GDM. Diet history obtained. Patient eats fair variety of all food groups. Her first meal is about 4 PM, then 9 PM and final meal around 2 AM. She is up most of the night and sleeps during the day. Beverages include Kool-aid, regular soda and occasional water.  The following learning objectives were met by the patient :   States the definition of Gestational Diabetes  States why dietary management is important in controlling blood glucose  Describes the effects of carbohydrates on blood glucose levels  Demonstrates ability to create a balanced meal plan  Demonstrates carbohydrate counting   States when to check blood glucose levels  Demonstrates proper blood glucose monitoring techniques  States the effect of stress and exercise on blood glucose levels  States the importance of limiting caffeine and abstaining from alcohol and smoking  Plan:  Aim for 3 Carb Choices per meal (45 grams) +/- 1 either way  Aim for 1-2 Carbs per snack Begin reading food labels for Total Carbohydrate of foods If OK with your MD, consider  increasing your activity level by walking, Arm Chair Exercises or other activity daily as tolerated Begin checking BG before breakfast and 2 hours after first bite of breakfast, lunch and dinner as directed by MD  Bring Log Book/Sheet to every medical appointment   Baby Scripts:  Patient was introduced to [employer] and states she prefers to record BG on Log  Sheet Take medication if directed by MD  Blood glucose monitor Rx called into pharmacy: Accu Check Guide with Fast Clix drums Patient instructed to test pre breakfast and 2 hours each meal as directed by MD  Patient instructed to monitor glucose  levels: FBS: 60 - 95 mg/dl 2 hour: <120 mg/dl  Patient received the following handouts:  Nutrition Diabetes and Pregnancy  Carbohydrate Counting List  BG Log Sheet  Patient will be seen for follow-up as needed.

## 2018-01-21 LAB — GC/CHLAMYDIA PROBE AMP (~~LOC~~) NOT AT ARMC
CHLAMYDIA, DNA PROBE: NEGATIVE
NEISSERIA GONORRHEA: NEGATIVE

## 2018-01-24 ENCOUNTER — Other Ambulatory Visit: Payer: Self-pay

## 2018-01-24 ENCOUNTER — Inpatient Hospital Stay (HOSPITAL_COMMUNITY)
Admission: AD | Admit: 2018-01-24 | Discharge: 2018-01-24 | Disposition: A | Discharge status: Home / Self Care | Payer: Medicaid Other | Source: Ambulatory Visit | Attending: Obstetrics & Gynecology | Admitting: Obstetrics & Gynecology

## 2018-01-24 ENCOUNTER — Encounter (HOSPITAL_COMMUNITY): Payer: Self-pay | Admitting: *Deleted

## 2018-01-24 DIAGNOSIS — O479 False labor, unspecified: Secondary | ICD-10-CM

## 2018-01-24 DIAGNOSIS — Z3A36 36 weeks gestation of pregnancy: Secondary | ICD-10-CM | POA: Insufficient documentation

## 2018-01-24 DIAGNOSIS — O10919 Unspecified pre-existing hypertension complicating pregnancy, unspecified trimester: Secondary | ICD-10-CM

## 2018-01-24 DIAGNOSIS — Z641 Problems related to multiparity: Secondary | ICD-10-CM

## 2018-01-24 DIAGNOSIS — O4703 False labor before 37 completed weeks of gestation, third trimester: Secondary | ICD-10-CM | POA: Diagnosis not present

## 2018-01-24 LAB — CULTURE, BETA STREP (GROUP B ONLY): Strep Gp B Culture: NEGATIVE

## 2018-01-24 NOTE — Discharge Instructions (Signed)
Braxton Hicks Contractions °Contractions of the uterus can occur throughout pregnancy, but they are not always a sign that you are in labor. You may have practice contractions called Braxton Hicks contractions. These false labor contractions are sometimes confused with true labor. °What are Braxton Hicks contractions? °Braxton Hicks contractions are tightening movements that occur in the muscles of the uterus before labor. Unlike true labor contractions, these contractions do not result in opening (dilation) and thinning of the cervix. Toward the end of pregnancy (32-34 weeks), Braxton Hicks contractions can happen more often and may become stronger. These contractions are sometimes difficult to tell apart from true labor because they can be very uncomfortable. You should not feel embarrassed if you go to the hospital with false labor. °Sometimes, the only way to tell if you are in true labor is for your health care provider to look for changes in the cervix. The health care provider will do a physical exam and may monitor your contractions. If you are not in true labor, the exam should show that your cervix is not dilating and your water has not broken. °If there are other health problems associated with your pregnancy, it is completely safe for you to be sent home with false labor. You may continue to have Braxton Hicks contractions until you go into true labor. °How to tell the difference between true labor and false labor °True labor °· Contractions last 30-70 seconds. °· Contractions become very regular. °· Discomfort is usually felt in the top of the uterus, and it spreads to the lower abdomen and low back. °· Contractions do not go away with walking. °· Contractions usually become more intense and increase in frequency. °· The cervix dilates and gets thinner. °False labor °· Contractions are usually shorter and not as strong as true labor contractions. °· Contractions are usually irregular. °· Contractions  are often felt in the front of the lower abdomen and in the groin. °· Contractions may go away when you walk around or change positions while lying down. °· Contractions get weaker and are shorter-lasting as time goes on. °· The cervix usually does not dilate or become thin. °Follow these instructions at home: °· Take over-the-counter and prescription medicines only as told by your health care provider. °· Keep up with your usual exercises and follow other instructions from your health care provider. °· Eat and drink lightly if you think you are going into labor. °· If Braxton Hicks contractions are making you uncomfortable: °? Change your position from lying down or resting to walking, or change from walking to resting. °? Sit and rest in a tub of warm water. °? Drink enough fluid to keep your urine pale yellow. Dehydration may cause these contractions. °? Do slow and deep breathing several times an hour. °· Keep all follow-up prenatal visits as told by your health care provider. This is important. °Contact a health care provider if: °· You have a fever. °· You have continuous pain in your abdomen. °Get help right away if: °· Your contractions become stronger, more regular, and closer together. °· You have fluid leaking or gushing from your vagina. °· You pass blood-tinged mucus (bloody show). °· You have bleeding from your vagina. °· You have low back pain that you never had before. °· You feel your baby’s head pushing down and causing pelvic pressure. °· Your baby is not moving inside you as much as it used to. °Summary °· Contractions that occur before labor are called Braxton   Hicks contractions, false labor, or practice contractions. °· Braxton Hicks contractions are usually shorter, weaker, farther apart, and less regular than true labor contractions. True labor contractions usually become progressively stronger and regular and they become more frequent. °· Manage discomfort from Braxton Hicks contractions by  changing position, resting in a warm bath, drinking plenty of water, or practicing deep breathing. °This information is not intended to replace advice given to you by your health care provider. Make sure you discuss any questions you have with your health care provider. °Document Released: 06/18/2016 Document Revised: 06/18/2016 Document Reviewed: 06/18/2016 °Elsevier Interactive Patient Education © 2018 [employer] ° °Fetal Movement Counts °Patient Name: ________________________________________________ Patient Due Date: ____________________ °What is a fetal movement count? °A fetal movement count is the number of times that you feel your baby move during a certain amount of time. This may also be called a fetal kick count. A fetal movement count is recommended for every pregnant woman. You may be asked to start counting fetal movements as early as week 28 of your pregnancy. °Pay attention to when your baby is most active. You may notice your baby's sleep and wake cycles. You may also notice things that make your baby move more. You should do a fetal movement count: °· When your baby is normally most active. °· At the same time each day. ° °A good time to count movements is while you are resting, after having something to eat and drink. °How do I count fetal movements? °1. Find a quiet, comfortable area. Sit, or lie down on your side. °2. Write down the date, the start time and stop time, and the number of movements that you felt between those two times. Take this information with you to your health care visits. °3. For 2 hours, count kicks, flutters, swishes, rolls, and jabs. You should feel at least 10 movements during 2 hours. °4. You may stop counting after you have felt 10 movements. °5. If you do not feel 10 movements in 2 hours, have something to eat and drink. Then, keep resting and counting for 1 hour. If you feel at least 4 movements during that hour, you may stop counting. °Contact a health care  provider if: °· You feel fewer than 4 movements in 2 hours. °· Your baby is not moving like he or she usually does. °Date: ____________ Start time: ____________ Stop time: ____________ Movements: ____________ °Date: ____________ Start time: ____________ Stop time: ____________ Movements: ____________ °Date: ____________ Start time: ____________ Stop time: ____________ Movements: ____________ °Date: ____________ Start time: ____________ Stop time: ____________ Movements: ____________ °Date: ____________ Start time: ____________ Stop time: ____________ Movements: ____________ °Date: ____________ Start time: ____________ Stop time: ____________ Movements: ____________ °Date: ____________ Start time: ____________ Stop time: ____________ Movements: ____________ °Date: ____________ Start time: ____________ Stop time: ____________ Movements: ____________ °Date: ____________ Start time: ____________ Stop time: ____________ Movements: ____________ °This information is not intended to replace advice given to you by your health care provider. Make sure you discuss any questions you have with your health care provider. °Document Released: 03/04/2006 Document Revised: 10/02/2015 Document Reviewed: 03/14/2015 °Elsevier Interactive Patient Education © 2018 [employer] ° °

## 2018-01-24 NOTE — MAU Note (Signed)
I have communicated with Dr. Aneta MinsPhillip and reviewed vital signs:  Vitals:   01/24/18 1107 01/24/18 1325  BP: 132/81 138/85  Pulse: (!) 120 (!) 108  Resp: 18 20  Temp: 98.6 F (37 C) 98.3 F (36.8 C)  SpO2: 98% 100%    Vaginal exam:  Dilation: 2 Effacement (%): Thick Cervical Position: Posterior Station: -2 Presentation: Vertex Exam by:: F. Alexica Schlossberg, RNC,   Also reviewed contraction pattern and that non-stress test is reactive.  It has been documented that patient is contracting irregularly with minimal cervical change over 1.5 hours not indicating active labor.  Patient denies any other complaints.  Based on this report provider has given order for discharge.  A discharge order and diagnosis entered by a provider.   Labor discharge instructions reviewed with patient.

## 2018-01-24 NOTE — MAU Note (Signed)
Presents with c/o regular ctxs since last night, unsure of frequency.  Denies VB or LOF.  Reports +FM.

## 2018-01-27 ENCOUNTER — Encounter: Payer: Self-pay | Admitting: Obstetrics and Gynecology

## 2018-01-27 ENCOUNTER — Other Ambulatory Visit: Payer: Self-pay

## 2018-01-27 ENCOUNTER — Ambulatory Visit (HOSPITAL_COMMUNITY): Admission: RE | Admit: 2018-01-27 | Payer: Medicaid Other | Source: Ambulatory Visit

## 2018-01-31 ENCOUNTER — Encounter (HOSPITAL_COMMUNITY): Payer: Self-pay

## 2018-01-31 ENCOUNTER — Ambulatory Visit (INDEPENDENT_AMBULATORY_CARE_PROVIDER_SITE_OTHER): Payer: Medicaid Other | Admitting: Obstetrics and Gynecology

## 2018-01-31 ENCOUNTER — Telehealth (HOSPITAL_COMMUNITY): Payer: Self-pay | Admitting: *Deleted

## 2018-01-31 ENCOUNTER — Inpatient Hospital Stay (HOSPITAL_BASED_OUTPATIENT_CLINIC_OR_DEPARTMENT_OTHER): Payer: Medicaid Other

## 2018-01-31 ENCOUNTER — Ambulatory Visit (INDEPENDENT_AMBULATORY_CARE_PROVIDER_SITE_OTHER): Payer: Medicaid Other | Admitting: *Deleted

## 2018-01-31 ENCOUNTER — Inpatient Hospital Stay (EMERGENCY_DEPARTMENT_HOSPITAL)
Admission: AD | Admit: 2018-01-31 | Discharge: 2018-01-31 | Disposition: A | Discharge status: Home / Self Care | Payer: Medicaid Other | Source: Ambulatory Visit | Attending: Obstetrics and Gynecology | Admitting: Obstetrics and Gynecology

## 2018-01-31 ENCOUNTER — Ambulatory Visit (HOSPITAL_COMMUNITY): Admission: RE | Admit: 2018-01-31 | Payer: Medicaid Other | Source: Ambulatory Visit

## 2018-01-31 VITALS — Wt 187.2 lb

## 2018-01-31 DIAGNOSIS — O9989 Other specified diseases and conditions complicating pregnancy, childbirth and the puerperium: Secondary | ICD-10-CM

## 2018-01-31 DIAGNOSIS — Z283 Underimmunization status: Secondary | ICD-10-CM

## 2018-01-31 DIAGNOSIS — O24410 Gestational diabetes mellitus in pregnancy, diet controlled: Secondary | ICD-10-CM | POA: Insufficient documentation

## 2018-01-31 DIAGNOSIS — O288 Other abnormal findings on antenatal screening of mother: Secondary | ICD-10-CM | POA: Diagnosis not present

## 2018-01-31 DIAGNOSIS — O10913 Unspecified pre-existing hypertension complicating pregnancy, third trimester: Secondary | ICD-10-CM

## 2018-01-31 DIAGNOSIS — O0993 Supervision of high risk pregnancy, unspecified, third trimester: Secondary | ICD-10-CM | POA: Diagnosis not present

## 2018-01-31 DIAGNOSIS — O10919 Unspecified pre-existing hypertension complicating pregnancy, unspecified trimester: Secondary | ICD-10-CM

## 2018-01-31 DIAGNOSIS — Z641 Problems related to multiparity: Secondary | ICD-10-CM | POA: Diagnosis not present

## 2018-01-31 DIAGNOSIS — O0933 Supervision of pregnancy with insufficient antenatal care, third trimester: Secondary | ICD-10-CM

## 2018-01-31 DIAGNOSIS — O10013 Pre-existing essential hypertension complicating pregnancy, third trimester: Secondary | ICD-10-CM | POA: Insufficient documentation

## 2018-01-31 DIAGNOSIS — Z362 Encounter for other antenatal screening follow-up: Secondary | ICD-10-CM | POA: Diagnosis not present

## 2018-01-31 DIAGNOSIS — Z3A37 37 weeks gestation of pregnancy: Secondary | ICD-10-CM | POA: Insufficient documentation

## 2018-01-31 DIAGNOSIS — O09293 Supervision of pregnancy with other poor reproductive or obstetric history, third trimester: Secondary | ICD-10-CM

## 2018-01-31 DIAGNOSIS — O09299 Supervision of pregnancy with other poor reproductive or obstetric history, unspecified trimester: Secondary | ICD-10-CM

## 2018-01-31 DIAGNOSIS — O24419 Gestational diabetes mellitus in pregnancy, unspecified control: Secondary | ICD-10-CM | POA: Diagnosis not present

## 2018-01-31 DIAGNOSIS — Z2839 Other underimmunization status: Secondary | ICD-10-CM

## 2018-01-31 LAB — GLUCOSE, CAPILLARY: Glucose-Capillary: 98 mg/dL (ref 70–99)

## 2018-01-31 LAB — POCT URINALYSIS DIP (DEVICE)
Glucose, UA: NEGATIVE mg/dL
HGB URINE DIPSTICK: NEGATIVE
Nitrite: NEGATIVE
PH: 7 (ref 5.0–8.0)
PROTEIN: 30 mg/dL — AB
Specific Gravity, Urine: 1.015 (ref 1.005–1.030)
Urobilinogen, UA: 2 mg/dL — ABNORMAL HIGH (ref 0.0–1.0)

## 2018-01-31 MED ORDER — ACETAMINOPHEN 500 MG PO TABS
1000.0000 mg | ORAL_TABLET | Freq: Once | ORAL | Status: AC
  Administered 2018-01-31: 1000 mg via ORAL
  Filled 2018-01-31: qty 2

## 2018-01-31 NOTE — MAU Provider Note (Signed)
Chief Complaint:  Other (non reactive NST in office)   First Provider Initiated Contact with Patient 01/31/18 1216     HPI: Monique Gamble is a 27 y.o. [id number] at [redacted]w[redacted]d who presents to maternity admissions from the office for non reactive NST. Current pregnancy complicated by GDM & CHTN. Per Dr. Earlene Plater, pt was non reactive in the office this morning. Scheduled for BPP & growth ultrasound this afternoon, will be done through MAU.  Denies abdominal pain, vaginal bleeding, or LOF. Normal fetal movement.  Endorses headache today. Has not missed her procardia doses.  No visual disturbance or epigastric pain.   Location: head Quality: aching Severity: 7/10 in pain scale Duration: <1 day Timing: constant Modifying factors: hasn't treated symptoms. Nothing makes worse or better.  Associated signs and symptoms: none    Past Medical History:  Diagnosis Date  . ADHD (attention deficit hyperactivity disorder)   . Chlamydia   . Gestational diabetes   . Headache   . History of anemia   . Hypertension   . Pregnancy induced hypertension    OB History  Gravida Para Term Preterm AB Living  8 5 5  0 2 5  SAB TAB Ectopic Multiple Live Births  1 1 0 0 5    # Outcome Date GA Lbr Len/2nd Weight Sex Delivery Anes PTL Lv  8 Current           7 SAB 04/2017          6 Term 08/22/16 [redacted]w[redacted]d 429:40 / 00:07 3110 g M Vag-Spont None  LIV  5 Term 01/21/15 [redacted]w[redacted]d 02:38 / 00:03 2807 g M Vag-Spont None  LIV  4 Term 03/15/14 [redacted]w[redacted]d 03:15 / 00:02 3195 g M Vag-Spont None  LIV  3 Term 08/15/11 [redacted]w[redacted]d 01:38 / 00:14 3065 g F Vag-Spont None  LIV  2 Term 2009 [redacted]w[redacted]d  2977 g F Vag-Spont   LIV  1 TAB 2005           Past Surgical History:  Procedure Laterality Date  . INDUCED ABORTION     Family History  Problem Relation Age of Onset  . Hypertension Mother   . Hypertension Father   . Diabetes Sister   . Anesthesia problems Neg Hx    Social History   Tobacco Use  . Smoking status: Never Smoker  . Smokeless  tobacco: Never Used  Substance Use Topics  . Alcohol use: No  . Drug use: Yes    Types: Marijuana    Comment: last use 03 Jul 2017   No Known Allergies Medications Prior to Admission  Medication Sig Dispense Refill Last Dose  . ACCU-CHEK FASTCLIX LANCETS MISC 1 Device by Percutaneous route 4 (four) times daily. 100 each 12 Taking  . acetaminophen (TYLENOL) 500 MG tablet Take 500 mg by mouth every 6 (six) hours as needed for mild pain or moderate pain.   Taking  . aspirin EC 81 MG tablet Take 1 tablet (81 mg total) by mouth daily. Take after 12 weeks for prevention of preeclampsia later in pregnancy (Patient not taking: Reported on 01/31/2018) 300 tablet 2 Not Taking  . butalbital-acetaminophen-caffeine (FIORICET, ESGIC) 50-325-40 MG tablet Take 1 tablet by mouth every 6 (six) hours as needed for headache. 10 tablet 0 Taking  . cyclobenzaprine (FLEXERIL) 10 MG tablet Take 1 tablet (10 mg total) by mouth 3 (three) times daily as needed for muscle spasms. (Patient not taking: Reported on 01/31/2018) 30 tablet 3 Not Taking  . Elastic Bandages &  Supports (COMFORT FIT MATERNITY SUPP SM) MISC 1 Units by Does not apply route daily as needed. (Patient not taking: Reported on 12/20/2017) 1 each 0 Not Taking  . glucose blood (ACCU-CHEK GUIDE) test strip Use as instructed QID 100 each 12 Taking  . Iron Polysacch Cmplx-B12-FA 150-0.025-1 MG CAPS Take 1 tablet by mouth daily. (Patient not taking: Reported on 12/20/2017) 30 each 2 Not Taking  . NIFEdipine (ADALAT CC) 30 MG 24 hr tablet Take 1 tablet (30 mg total) by mouth daily. 30 tablet 2 Taking  . ondansetron (ZOFRAN ODT) 4 MG disintegrating tablet Take 1 tablet (4 mg total) by mouth every 8 (eight) hours as needed for nausea or vomiting. (Patient not taking: Reported on 01/31/2018) 30 tablet 0 Not Taking  . promethazine (PHENERGAN) 25 MG tablet Take 1 tablet (25 mg total) by mouth every 6 (six) hours as needed for nausea or vomiting. 30 tablet 3 Taking     I have reviewed patient's Past Medical Hx, Surgical Hx, Family Hx, Social Hx, medications and allergies.   ROS:  Review of Systems  Constitutional: Negative.   Eyes: Negative for visual disturbance.  Gastrointestinal: Negative.   Genitourinary: Negative.   Neurological: Positive for headaches.    Physical Exam   Patient Vitals for the past 24 hrs:  BP Pulse Resp Weight  01/31/18 1238 138/84 97 - -  01/31/18 1202 137/79 95 - -  01/31/18 1201 - - 16 85.5 kg    Constitutional: Well-developed, well-nourished female in no acute distress.  Cardiovascular: normal rate & rhythm, no murmur Respiratory: normal effort, lung sounds clear throughout GI: Abd soft, non-tender, gravid appropriate for gestational age. Pos BS x 4 MS: Extremities nontender, no edema, normal ROM Neurologic: Alert and oriented x 4.   NST:  Baseline: 145 bpm, Variability: Good {> 6 bpm), Accelerations: Reactive and Decelerations: Absent   Labs: Results for orders placed or performed during the hospital encounter of 01/31/18 (from the past 24 hour(s))  Glucose, capillary     Status: None   Collection Time: 01/31/18 12:46 PM  Result Value Ref Range   Glucose-Capillary 98 70 - 99 mg/dL    Imaging:  No results found.  MAU Course: Orders Placed This Encounter  Procedures  . [place] MFM FETAL BPP WO NON STRESS  . [place] MFM OB FOLLOW UP  . Glucose, capillary  . Discharge patient   Meds ordered this encounter  Medications  . acetaminophen (TYLENOL) tablet 1,000 mg    MDM: Reactive NST Normotensive Headache resolved with tylenol BPP 8/8, normal AFI. EFW 51%  Assessment: 1. Non-reactive NST (non-stress test)   2. [redacted] weeks gestation of pregnancy   3. Diet controlled gestational diabetes mellitus (GDM) in third trimester   4. Chronic hypertension during pregnancy     Plan: Discharge home in stable condition.  Labor precautions and fetal kick counts HTN precautions   Allergies as of 01/31/2018   No  Known Allergies     Medication List    TAKE these medications   ACCU-CHEK FASTCLIX LANCETS Misc 1 Device by Percutaneous route 4 (four) times daily.   acetaminophen 500 MG tablet Commonly known as:  TYLENOL Take 500 mg by mouth every 6 (six) hours as needed for mild pain or moderate pain.   aspirin EC 81 MG tablet Take 1 tablet (81 mg total) by mouth daily. Take after 12 weeks for prevention of preeclampsia later in pregnancy   butalbital-acetaminophen-caffeine 50-325-40 MG tablet Commonly known as:  FIORICET, ESGIC  Take 1 tablet by mouth every 6 (six) hours as needed for headache.   COMFORT FIT MATERNITY SUPP SM Misc 1 Units by Does not apply route daily as needed.   cyclobenzaprine 10 MG tablet Commonly known as:  FLEXERIL Take 1 tablet (10 mg total) by mouth 3 (three) times daily as needed for muscle spasms.   glucose blood test strip Commonly known as:  ACCU-CHEK GUIDE Use as instructed QID   Iron Polysacch Cmplx-B12-FA 150-0.025-1 MG Caps Take 1 tablet by mouth daily.   NIFEdipine 30 MG 24 hr tablet Commonly known as:  ADALAT CC Take 1 tablet (30 mg total) by mouth daily.   ondansetron 4 MG disintegrating tablet Commonly known as:  ZOFRAN ODT Take 1 tablet (4 mg total) by mouth every 8 (eight) hours as needed for nausea or vomiting.   promethazine 25 MG tablet Commonly known as:  PHENERGAN Take 1 tablet (25 mg total) by mouth every 6 (six) hours as needed for nausea or vomiting.       Judeth HornLawrence, Lowella Kindley, NP 01/31/2018 1:44 PM

## 2018-01-31 NOTE — Discharge Instructions (Signed)

## 2018-01-31 NOTE — Progress Notes (Signed)
   PRENATAL VISIT NOTE  Subjective:  Monique Gamble is a 27 y.o. [id number] at 38w0dbeing seen today for ongoing prenatal care.  She is currently monitored for the following issues for this high-risk pregnancy and has Rubella non-immune status, antepartum; Supervision of high-risk pregnancy; H/O pre-eclampsia in prior pregnancy, currently pregnant; No prenatal care in current pregnancy; Anemia in pregnancy; Chronic hypertension during pregnancy, antepartum; GDM (gestational diabetes mellitus); and Grand multipara on their problem list.  Patient reports contractions every 9 min for a few days, loss of mucous plug..  Contractions: Irregular. Vag. Bleeding: None.  Movement: Present. Denies leaking of fluid.   The following portions of the patient's history were reviewed and updated as appropriate: allergies, current medications, past family history, past medical history, past social history, past surgical history and problem list. Problem list updated.  Objective:   Vitals:   01/31/18 1050  Weight: 187 lb 3.2 oz (84.9 kg)    Fetal Status: Fetal Heart Rate (bpm): NST   Movement: Present     General:  Alert, oriented and cooperative. Patient is in no acute distress.  Skin: Skin is warm and dry. No rash noted.   Cardiovascular: Normal heart rate noted  Respiratory: Normal respiratory effort, no problems with respiration noted  Abdomen: Soft, gravid, appropriate for gestational age.  Pain/Pressure: Present     Pelvic: Cervical exam deferred        Extremities: Normal range of motion.     Mental Status: Normal mood and affect. Normal behavior. Normal judgment and thought content.   Assessment and Plan:  Pregnancy: [id number] 328w0d1. Supervision of high risk pregnancy in third trimester IOL scheduled for 39 weeks, orders placed today  2. Chronic hypertension during pregnancy, antepartum - Procardia 30 mg XL daily - Not taking baby ASA - [place] FETAL BPP WO NON STRESS; Future  3.  Rubella non-immune status, antepartum MMR pp  4. H/O pre-eclampsia in prior pregnancy, currently pregnant Not taking baby ASA  5. Diet controlled gestational diabetes mellitus (GDM) in third trimester States she is  checking BG however does not have log - reviewed importance of checking BG and bringing log with her - [place] FETAL BPP WO NON STRESS; Future  6. Grand multipara   Non-reactive NST in office, to MAU for eval  Term labor symptoms and general obstetric precautions including but not limited to vaginal bleeding, contractions, leaking of fluid and fetal movement were reviewed in detail with the patient. Please refer to After Visit Summary for other counseling recommendations.  Return in about 1 week (around 02/07/2018) for NST/BPP and HOB.  Future Appointments  Date Time Provider [place]/16/[place]  3:15 PM [place] WH-MFCUS MFC-US  02/07/2018  1:15 PM WOC-WOCA NST WOC-WOCA WOC  02/07/2018  2:15 PM St[place]ruett MainlandDO WOC-WOCA WOC    KeSloan LeiterMD

## 2018-01-31 NOTE — Telephone Encounter (Signed)
Preadmission screen  

## 2018-01-31 NOTE — Progress Notes (Signed)
Pt reports frequent H/A's, occasional dizziness and seeing spots. Pt states she is checking CBG 2-3 times daily. US for growth and BPP scheduled today.

## 2018-02-01 ENCOUNTER — Inpatient Hospital Stay (HOSPITAL_COMMUNITY)
Admission: AD | Admit: 2018-02-01 | Discharge: 2018-02-03 | DRG: 798 | Disposition: A | Discharge status: Home / Self Care | Payer: Medicaid Other | Attending: Family Medicine | Admitting: Family Medicine

## 2018-02-01 ENCOUNTER — Inpatient Hospital Stay (HOSPITAL_COMMUNITY): Payer: Medicaid Other | Admitting: Anesthesiology

## 2018-02-01 ENCOUNTER — Encounter (HOSPITAL_COMMUNITY)
Admission: AD | Disposition: A | Discharge status: Home / Self Care | Payer: Self-pay | Source: Home / Self Care | Attending: Family Medicine

## 2018-02-01 ENCOUNTER — Encounter (HOSPITAL_COMMUNITY): Payer: Self-pay | Admitting: Emergency Medicine

## 2018-02-01 ENCOUNTER — Other Ambulatory Visit: Payer: Self-pay

## 2018-02-01 DIAGNOSIS — Z302 Encounter for sterilization: Secondary | ICD-10-CM | POA: Diagnosis not present

## 2018-02-01 DIAGNOSIS — O9902 Anemia complicating childbirth: Secondary | ICD-10-CM | POA: Diagnosis present

## 2018-02-01 DIAGNOSIS — O09299 Supervision of pregnancy with other poor reproductive or obstetric history, unspecified trimester: Secondary | ICD-10-CM

## 2018-02-01 DIAGNOSIS — Z3483 Encounter for supervision of other normal pregnancy, third trimester: Secondary | ICD-10-CM | POA: Diagnosis present

## 2018-02-01 DIAGNOSIS — O24420 Gestational diabetes mellitus in childbirth, diet controlled: Secondary | ICD-10-CM | POA: Diagnosis present

## 2018-02-01 DIAGNOSIS — D649 Anemia, unspecified: Secondary | ICD-10-CM | POA: Diagnosis present

## 2018-02-01 DIAGNOSIS — Z641 Problems related to multiparity: Secondary | ICD-10-CM

## 2018-02-01 DIAGNOSIS — O1002 Pre-existing essential hypertension complicating childbirth: Principal | ICD-10-CM | POA: Diagnosis present

## 2018-02-01 DIAGNOSIS — O24419 Gestational diabetes mellitus in pregnancy, unspecified control: Secondary | ICD-10-CM | POA: Diagnosis present

## 2018-02-01 DIAGNOSIS — O24410 Gestational diabetes mellitus in pregnancy, diet controlled: Secondary | ICD-10-CM | POA: Diagnosis not present

## 2018-02-01 DIAGNOSIS — Z3A37 37 weeks gestation of pregnancy: Secondary | ICD-10-CM | POA: Diagnosis not present

## 2018-02-01 DIAGNOSIS — Z23 Encounter for immunization: Secondary | ICD-10-CM

## 2018-02-01 DIAGNOSIS — O479 False labor, unspecified: Secondary | ICD-10-CM | POA: Diagnosis not present

## 2018-02-01 DIAGNOSIS — O10919 Unspecified pre-existing hypertension complicating pregnancy, unspecified trimester: Secondary | ICD-10-CM

## 2018-02-01 DIAGNOSIS — O10913 Unspecified pre-existing hypertension complicating pregnancy, third trimester: Secondary | ICD-10-CM | POA: Diagnosis not present

## 2018-02-01 HISTORY — PX: TUBAL LIGATION: SHX77

## 2018-02-01 LAB — COMPREHENSIVE METABOLIC PANEL
ALBUMIN: 2.6 g/dL — AB (ref 3.5–5.0)
ALT: 6 U/L (ref 0–44)
AST: 15 U/L (ref 15–41)
Alkaline Phosphatase: 169 U/L — ABNORMAL HIGH (ref 38–126)
Anion gap: 9 (ref 5–15)
BUN: <5 mg/dL — ABNORMAL LOW (ref 6–20)
CO2: 19 mmol/L — AB (ref 22–32)
Calcium: 8.4 mg/dL — ABNORMAL LOW (ref 8.9–10.3)
Chloride: 104 mmol/L (ref 98–111)
Creatinine, Ser: 0.46 mg/dL (ref 0.44–1.00)
GFR calc Af Amer: >60 mL/min (ref >60)
GFR calc non Af Amer: >60 mL/min (ref >60)
GLUCOSE: 104 mg/dL — AB (ref 70–99)
Potassium: 3.4 mmol/L — ABNORMAL LOW (ref 3.5–5.1)
Sodium: 132 mmol/L — ABNORMAL LOW (ref 135–145)
Total Bilirubin: 1 mg/dL (ref 0.3–1.2)
Total Protein: 6.7 g/dL (ref 6.5–8.1)

## 2018-02-01 LAB — CBC
HCT: 33.2 % — ABNORMAL LOW (ref 36.0–46.0)
Hemoglobin: 10.4 g/dL — ABNORMAL LOW (ref 12.0–15.0)
MCH: 26.4 pg (ref 26.0–34.0)
MCHC: 31.3 g/dL (ref 30.0–36.0)
MCV: 84.3 fL (ref 80.0–100.0)
PLATELETS: 223 10*3/uL (ref 150–400)
RBC: 3.94 MIL/uL (ref 3.87–5.11)
RDW: 17.4 % — ABNORMAL HIGH (ref 11.5–15.5)
WBC: 13.2 10*3/uL — ABNORMAL HIGH (ref 4.0–10.5)
nRBC: 0 % (ref 0.0–0.2)

## 2018-02-01 LAB — GLUCOSE, CAPILLARY
GLUCOSE-CAPILLARY: 118 mg/dL — AB (ref 70–99)
Glucose-Capillary: 104 mg/dL — ABNORMAL HIGH (ref 70–99)

## 2018-02-01 LAB — TYPE AND SCREEN
ABO/RH(D): O POS
Antibody Screen: NEGATIVE

## 2018-02-01 LAB — PROTEIN / CREATININE RATIO, URINE
Creatinine, Urine: 38 mg/dL
Protein Creatinine Ratio: 0.18 mg/mg{Cre} — ABNORMAL HIGH (ref 0.00–0.15)
Total Protein, Urine: 7 mg/dL

## 2018-02-01 SURGERY — LIGATION, FALLOPIAN TUBE, POSTPARTUM
Anesthesia: General | Site: Abdomen | Laterality: Bilateral | Wound class: Clean Contaminated

## 2018-02-01 MED ORDER — ONDANSETRON HCL 4 MG/2ML IJ SOLN: 4.0000 mg | INTRAMUSCULAR | Status: DC | PRN

## 2018-02-01 MED ORDER — LACTATED RINGERS IV SOLN: 500.0000 mL | INTRAVENOUS | Status: DC | PRN

## 2018-02-01 MED ORDER — PROPOFOL 10 MG/ML IV BOLUS
INTRAVENOUS | Status: DC | PRN
  Administered 2018-02-01: 10 mg via INTRAVENOUS
  Administered 2018-02-01: 20 mg via INTRAVENOUS
  Administered 2018-02-01 (×7): 10 mg via INTRAVENOUS

## 2018-02-01 MED ORDER — FAMOTIDINE IN NACL 20-0.9 MG/50ML-% IV SOLN
20.0000 mg | Freq: Once | INTRAVENOUS | Status: AC
  Administered 2018-02-01: 20 mg via INTRAVENOUS
  Filled 2018-02-01: qty 50

## 2018-02-01 MED ORDER — MEASLES, MUMPS & RUBELLA VAC IJ SOLR: 0.5000 mL | Freq: Once | INTRAMUSCULAR | Status: DC

## 2018-02-01 MED ORDER — FENTANYL CITRATE (PF) 100 MCG/2ML IJ SOLN
100.0000 ug | INTRAMUSCULAR | Status: DC | PRN
  Administered 2018-02-01 (×2): 100 ug via INTRAVENOUS
  Administered 2018-02-01: 15 ug via INTRAVENOUS
  Administered 2018-02-01: 100 ug via INTRAVENOUS
  Filled 2018-02-01 (×3): qty 2

## 2018-02-01 MED ORDER — FENTANYL CITRATE (PF) 100 MCG/2ML IJ SOLN
INTRAMUSCULAR | Status: AC
  Filled 2018-02-01: qty 2

## 2018-02-01 MED ORDER — BUPIVACAINE HCL (PF) 0.25 % IJ SOLN
INTRAMUSCULAR | Status: AC
  Filled 2018-02-01: qty 30

## 2018-02-01 MED ORDER — ONDANSETRON HCL 4 MG/2ML IJ SOLN
INTRAMUSCULAR | Status: AC
  Filled 2018-02-01: qty 2

## 2018-02-01 MED ORDER — OXYCODONE HCL 5 MG PO TABS: 5.0000 mg | ORAL_TABLET | Freq: Once | ORAL | Status: DC | PRN

## 2018-02-01 MED ORDER — ACETAMINOPHEN 500 MG PO TABS
1000.0000 mg | ORAL_TABLET | Freq: Once | ORAL | Status: DC
  Filled 2018-02-01: qty 2

## 2018-02-01 MED ORDER — SIMETHICONE 80 MG PO CHEW: 80.0000 mg | CHEWABLE_TABLET | ORAL | Status: DC | PRN

## 2018-02-01 MED ORDER — LABETALOL HCL 5 MG/ML IV SOLN: 20.0000 mg | INTRAVENOUS | Status: DC | PRN

## 2018-02-01 MED ORDER — PRENATAL MULTIVITAMIN CH: 1.0000 | ORAL_TABLET | Freq: Every day | ORAL | Status: DC

## 2018-02-01 MED ORDER — OXYTOCIN 40 UNITS IN LACTATED RINGERS INFUSION - SIMPLE MED
2.5000 [IU]/h | INTRAVENOUS | Status: DC
  Filled 2018-02-01: qty 1000

## 2018-02-01 MED ORDER — IBUPROFEN 600 MG PO TABS
600.0000 mg | ORAL_TABLET | Freq: Four times a day (QID) | ORAL | Status: DC
  Administered 2018-02-01 – 2018-02-03 (×7): 600 mg via ORAL
  Filled 2018-02-01 (×5): qty 1

## 2018-02-01 MED ORDER — DIPHENHYDRAMINE HCL 25 MG PO CAPS: 25.0000 mg | ORAL_CAPSULE | Freq: Four times a day (QID) | ORAL | Status: DC | PRN

## 2018-02-01 MED ORDER — PROPOFOL 10 MG/ML IV BOLUS
INTRAVENOUS | Status: AC
  Filled 2018-02-01: qty 20

## 2018-02-01 MED ORDER — BUPIVACAINE HCL (PF) 0.75 % IJ SOLN
INTRAMUSCULAR | Status: DC | PRN
  Administered 2018-02-01: 1.4 mL

## 2018-02-01 MED ORDER — ONDANSETRON HCL 4 MG/2ML IJ SOLN
4.0000 mg | Freq: Four times a day (QID) | INTRAMUSCULAR | Status: DC | PRN
  Administered 2018-02-01: 4 mg via INTRAVENOUS

## 2018-02-01 MED ORDER — SODIUM CHLORIDE (PF) 0.9 % IJ SOLN
INTRAMUSCULAR | Status: AC
  Filled 2018-02-01: qty 10

## 2018-02-01 MED ORDER — LIDOCAINE HCL (PF) 1 % IJ SOLN
30.0000 mL | INTRAMUSCULAR | Status: DC | PRN
  Filled 2018-02-01: qty 30

## 2018-02-01 MED ORDER — LACTATED RINGERS IV SOLN
INTRAVENOUS | Status: DC
  Administered 2018-02-01 (×4): via INTRAVENOUS

## 2018-02-01 MED ORDER — DIBUCAINE 1 % RE OINT: 1.0000 "application " | TOPICAL_OINTMENT | RECTAL | Status: DC | PRN

## 2018-02-01 MED ORDER — ONDANSETRON HCL 4 MG PO TABS: 4.0000 mg | ORAL_TABLET | ORAL | Status: DC | PRN

## 2018-02-01 MED ORDER — LABETALOL HCL 5 MG/ML IV SOLN: 40.0000 mg | INTRAVENOUS | Status: DC | PRN

## 2018-02-01 MED ORDER — SODIUM CHLORIDE 0.9 % IR SOLN
Status: DC | PRN
  Administered 2018-02-01: 1

## 2018-02-01 MED ORDER — WITCH HAZEL-GLYCERIN EX PADS: 1.0000 "application " | MEDICATED_PAD | CUTANEOUS | Status: DC | PRN

## 2018-02-01 MED ORDER — BENZOCAINE-MENTHOL 20-0.5 % EX AERO: 1.0000 "application " | INHALATION_SPRAY | CUTANEOUS | Status: DC | PRN

## 2018-02-01 MED ORDER — SENNOSIDES-DOCUSATE SODIUM 8.6-50 MG PO TABS
2.0000 | ORAL_TABLET | ORAL | Status: DC
  Administered 2018-02-01 – 2018-02-02 (×2): 2 via ORAL
  Filled 2018-02-01 (×2): qty 2

## 2018-02-01 MED ORDER — SOD CITRATE-CITRIC ACID 500-334 MG/5ML PO SOLN
30.0000 mL | Freq: Once | ORAL | Status: AC
  Administered 2018-02-01: 30 mL via ORAL

## 2018-02-01 MED ORDER — HYDRALAZINE HCL 20 MG/ML IJ SOLN: 10.0000 mg | INTRAMUSCULAR | Status: DC | PRN

## 2018-02-01 MED ORDER — COCONUT OIL OIL: 1.0000 "application " | TOPICAL_OIL | Status: DC | PRN

## 2018-02-01 MED ORDER — FLEET ENEMA 7-19 GM/118ML RE ENEM: 1.0000 | ENEMA | RECTAL | Status: DC | PRN

## 2018-02-01 MED ORDER — OXYCODONE HCL 5 MG PO TABS
5.0000 mg | ORAL_TABLET | ORAL | Status: DC | PRN
  Administered 2018-02-01 – 2018-02-03 (×7): 5 mg via ORAL
  Filled 2018-02-01 (×8): qty 1

## 2018-02-01 MED ORDER — MIDAZOLAM HCL 2 MG/2ML IJ SOLN
INTRAMUSCULAR | Status: AC
  Filled 2018-02-01: qty 2

## 2018-02-01 MED ORDER — TETANUS-DIPHTH-ACELL PERTUSSIS 5-2.5-18.5 LF-MCG/0.5 IM SUSP: 0.5000 mL | Freq: Once | INTRAMUSCULAR | Status: DC

## 2018-02-01 MED ORDER — ZOLPIDEM TARTRATE 5 MG PO TABS: 5.0000 mg | ORAL_TABLET | Freq: Every evening | ORAL | Status: DC | PRN

## 2018-02-01 MED ORDER — MIDAZOLAM HCL 2 MG/2ML IJ SOLN
INTRAMUSCULAR | Status: DC | PRN
  Administered 2018-02-01 (×2): 0.5 mg via INTRAVENOUS

## 2018-02-01 MED ORDER — OXYCODONE HCL 5 MG/5ML PO SOLN: 5.0000 mg | Freq: Once | ORAL | Status: DC | PRN

## 2018-02-01 MED ORDER — OXYTOCIN BOLUS FROM INFUSION
500.0000 mL | Freq: Once | INTRAVENOUS | Status: AC
  Administered 2018-02-01: 500 mL via INTRAVENOUS

## 2018-02-01 MED ORDER — LIDOCAINE HCL (CARDIAC) PF 100 MG/5ML IV SOSY
PREFILLED_SYRINGE | INTRAVENOUS | Status: AC
  Filled 2018-02-01: qty 5

## 2018-02-01 MED ORDER — ACETAMINOPHEN 325 MG PO TABS
650.0000 mg | ORAL_TABLET | ORAL | Status: DC | PRN
  Administered 2018-02-01 – 2018-02-03 (×3): 650 mg via ORAL
  Filled 2018-02-01 (×3): qty 2

## 2018-02-01 MED ORDER — LIDOCAINE HCL (CARDIAC) PF 100 MG/5ML IV SOSY
PREFILLED_SYRINGE | INTRAVENOUS | Status: DC | PRN
  Administered 2018-02-01: 70 mg via INTRATRACHEAL

## 2018-02-01 MED ORDER — PROMETHAZINE HCL 25 MG/ML IJ SOLN: 6.2500 mg | INTRAMUSCULAR | Status: DC | PRN

## 2018-02-01 MED ORDER — LABETALOL HCL 5 MG/ML IV SOLN: 80.0000 mg | INTRAVENOUS | Status: DC | PRN

## 2018-02-01 MED ORDER — SOD CITRATE-CITRIC ACID 500-334 MG/5ML PO SOLN
30.0000 mL | ORAL | Status: DC | PRN
  Filled 2018-02-01: qty 15

## 2018-02-01 MED ORDER — ACETAMINOPHEN 10 MG/ML IV SOLN: 1000.0000 mg | Freq: Once | INTRAVENOUS | Status: DC | PRN

## 2018-02-01 MED ORDER — HYDROMORPHONE HCL 1 MG/ML IJ SOLN: 0.2500 mg | INTRAMUSCULAR | Status: DC | PRN

## 2018-02-01 MED ORDER — BUPIVACAINE HCL (PF) 0.25 % IJ SOLN
INTRAMUSCULAR | Status: DC | PRN
  Administered 2018-02-01: 30 mL

## 2018-02-01 SURGICAL SUPPLY — 28 items
ADH SKN CLS APL DERMABOND .7 (GAUZE/BANDAGES/DRESSINGS) ×1
BLADE SURG 11 STRL SS (BLADE) ×3 IMPLANT
CLIP FILSHIE TUBAL LIGA STRL (Clip) ×3 IMPLANT
CLOTH BEACON ORANGE TIMEOUT ST (SAFETY) ×3 IMPLANT
DERMABOND ADVANCED (GAUZE/BANDAGES/DRESSINGS) ×2
DERMABOND ADVANCED .7 DNX12 (GAUZE/BANDAGES/DRESSINGS) IMPLANT
DRESSING OPSITE X SMALL 2X3 (GAUZE/BANDAGES/DRESSINGS) ×2 IMPLANT
DRSG OPSITE POSTOP 3X4 (GAUZE/BANDAGES/DRESSINGS) ×3 IMPLANT
DURAPREP 26ML APPLICATOR (WOUND CARE) ×3 IMPLANT
GLOVE BIOGEL PI IND STRL 7.0 (GLOVE) ×1 IMPLANT
GLOVE BIOGEL PI IND STRL 7.5 (GLOVE) ×1 IMPLANT
GLOVE BIOGEL PI INDICATOR 7.0 (GLOVE) ×2
GLOVE BIOGEL PI INDICATOR 7.5 (GLOVE) ×2
GLOVE ECLIPSE 7.5 STRL STRAW (GLOVE) ×3 IMPLANT
GOWN STRL REUS W/TWL LRG LVL3 (GOWN DISPOSABLE) ×6 IMPLANT
NEEDLE HYPO 22GX1.5 SAFETY (NEEDLE) ×3 IMPLANT
NS IRRIG 1000ML POUR BTL (IV SOLUTION) ×3 IMPLANT
PACK ABDOMINAL MINOR (CUSTOM PROCEDURE TRAY) ×3 IMPLANT
PROTECTOR NERVE ULNAR (MISCELLANEOUS) ×3 IMPLANT
SPONGE LAP 4X18 RFD (DISPOSABLE) IMPLANT
SUT PLAIN 2 0 (SUTURE) ×3
SUT PLAIN ABS 2-0 54XMFL TIE (SUTURE) ×1 IMPLANT
SUT VICRYL 0 UR6 27IN ABS (SUTURE) ×3 IMPLANT
SUT VICRYL 4-0 PS2 18IN ABS (SUTURE) ×3 IMPLANT
SYR CONTROL 10ML LL (SYRINGE) ×3 IMPLANT
TOWEL OR 17X24 6PK STRL BLUE (TOWEL DISPOSABLE) ×6 IMPLANT
TRAY FOLEY CATH SILVER 14FR (SET/KITS/TRAYS/PACK) ×3 IMPLANT
WATER STERILE IRR 1000ML POUR (IV SOLUTION) ×3 IMPLANT

## 2018-02-01 NOTE — Progress Notes (Signed)
OB Note Dr[facility] Adrian [facility]Stinson [facility] I introduced myself to Monique Gamble and she is okay with me doing her BTL. Papers are UTD  Monique Gamble Monique Gamble, Jr MD Attending [facility] (Faculty Practice) 02/01/2018 Time: 1620

## 2018-02-01 NOTE — Transfer of Care (Signed)
Immediate Anesthesia Transfer of Care Note  Patient: Monique Gamble  Procedure(s) Performed: POST PARTUM TUBAL LIGATION (Bilateral Abdomen)  Patient Location: PACU  Anesthesia Type:Spinal  Level of Consciousness: sedated  Airway & Oxygen Therapy: Patient Spontanous Breathing and Patient connected to nasal cannula oxygen  Post-op Assessment: Report given to RN and Post -op Vital signs reviewed and stable  Post vital signs: Reviewed and stable  Last Vitals:  Vitals Value Taken Time  BP 118/74 02/01/2018  5:15 PM  Temp    Pulse 84 02/01/2018  5:19 PM  Resp 16 02/01/2018  5:19 PM  SpO2 95 % 02/01/2018  5:19 PM  Vitals shown include unvalidated device data.  Last Pain:  Vitals:   02/01/18 1715  TempSrc:   PainSc: 0-No pain         Complications: No apparent anesthesia complications

## 2018-02-01 NOTE — Progress Notes (Signed)
Pt not coping well.  Refused tylenol.  Refusing toco.  Screaming.

## 2018-02-01 NOTE — Op Note (Signed)
Operative Note   02/01/2018  PRE-OP DIAGNOSIS: Desire for permanent sterilization.  Postpartum Day #0  POST-OP DIAGNOSIS: Same  SURGEON: Surgeon(s) and Role:    * Linden Bingharlie Mercedies Ganesh, MD - Primary  ASSISTANT: None  ANESTHESIA: spinal and local  PROCEDURE: mini-laparotomy, bilateral tubal ligation via Filshie Clips method  ESTIMATED BLOOD LOSS: 10mL  DRAINS: indwelling foley. UOP per anesthesia note  TOTAL IV FLUIDS: per anesthesia note  SPECIMENS: none  VTE PROPHYLAXIS: SCDs to the bilateral lower extremities  ANTIBIOTICS: not indicated  COMPLICATIONS: none  DISPOSITION: PACU - hemodynamically stable.  CONDITION: stable  FINDINGS: No intra-abdominal adhesions noted. Smooth, normally contoured uterine fundus and bilateral tubes. Normal appearing tubes, fimbriae  PROCEDURE IN DETAIL: The patient was taken to the OR where anesthesia was administed. The patient was positioned in dorsal supine. The patient was prepped and draped in the normal sterile fashion a 3-4cm horizontal incision was made at the umbilicus, after injection with local anesthesia. The skin was then incised with the scalpel and the underlying tissue dissected with the bovie and the fascia nicked in the midline with the scalpel and then extended laterally sharply.  The abdomen was then entered bluntly and a moist lap sponge used to displace the bowel.   The left Fallopian tube was identified by tracing out to the fimbraie, grasped with the Babcock clamps. An avascular midsection of the tube approximately 3-4cm from the cornua was grasped with the babcock clamps and the filshie clip was applied, taking care to incorporate the entire tube.  Attention was then turned to the right fallopian tube after confirmation by tracing the tube out to the fimbriae. The same procedure was then performed on the right Fallopian tube, with excellent hemostasis was noted from both BTL sites.  The lap sponge was then removed from the  abdomen and the fascia closed in running fashion with 0 vicryl and the skin was then closed with 4-0 vicryl and dermabond.    The patient tolerated the procedure well. All counts were correct x 2. The patient was transferred to the recovery room awake, alert and breathing independently.   Cornelia Copaharlie Quadre Bristol, Jr MD Attending Center [facility](Faculty Practice)

## 2018-02-01 NOTE — MAU Note (Signed)
Patient presents by EMS for CTX 6-208min apart since 0200.  Denies leaking of fluid, but reports some bloody show.  Hx of gestational diabetes and preE per patient.

## 2018-02-01 NOTE — H&P (Addendum)
LABOR AND DELIVERY ADMISSION HISTORY AND PHYSICAL NOTE  Monique Gamble is a 27 y.o. female [phone] with IUP at [redacted]w[redacted]d by L/24 presenting for SOL.   She reports positive fetal movement. She denies leakage of fluid but reports some bloody show this morning.  cHTN with BP today 140s/90s. She denies a headache currently but reports seeing dots in her vision. States not taking BP medication regularly.   P[facility] History/Complications[facility] Pregnancy complications:  - A1GDM - cHTN: Procardia 30mg  daily. Not taking ASA 81mg . - Rubella non-immune status  - Hx PEC in prior pregnancy   Past Medical History: Past Medical History:  Diagnosis Date  . ADHD (attention deficit hyperactivity disorder)   . Chlamydia   . Gestational diabetes   . Headache   . History of anemia   . Hypertension   . Pregnancy induced hypertension     Past Surgical History: Past Surgical History:  Procedure Laterality Date  . INDUCED ABORTION      Obstetrical History: OB History    Gravida  8   Para  5   Term  5   Preterm  0   AB  2   Living  5     SAB  1   TAB  1   Ectopic  0   Multiple  0   Live Births  5           Social History: Social History   Socioeconomic History  . Marital status: Single    Spouse name: Not on file  . Number of children: Not on file  . Years of education: Not on file  . Highest education level: Not on file  Occupational History  . Not on file  Social Needs  . Financial resource strain: Not on file  . Food insecurity:    Worry: Not on file    Inability: Not on file  . Transportation needs:    Medical: Not on file    Non-medical: Not on file  Tobacco Use  . Smoking status: Never Smoker  . Smokeless tobacco: Never Used  Substance and Sexual Activity  . Alcohol use: No  . Drug use: Yes    Types: Marijuana    Comment: last use 03 Jul 2017  . Sexual activity: Yes    Birth control/protection: None  Lifestyle  .  Physical activity:    Days per week: Not on file    Minutes per session: Not on file  . Stress: Not on file  Relationships  . Social connections:    Talks on phone: Not on file    Gets together: Not on file    Attends religious service: Not on file    Active member of club or organization: Not on file    Attends meetings of clubs or organizations: Not on file    Relationship status: Not on file  Other Topics Concern  . Not on file  Social History Narrative  . Not on file    Family History: Family History  Problem Relation Age of Onset  . Hypertension Mother   . Hypertension Father   . Diabetes Sister   . Anesthesia problems Neg Hx     Allergies: No Known Allergies  Medications Prior to Admission  Medication Sig Dispense Refill Last Dose  . ACCU-CHEK FASTCLIX LANCETS MISC 1 Device by Percutaneous route 4 (four) times daily. 100 each 12 Taking  . acetaminophen (TYLENOL) 500 MG tablet Take 500 mg by  mouth every 6 (six) hours as needed for mild pain or moderate pain.   Taking  . aspirin EC 81 MG tablet Take 1 tablet (81 mg total) by mouth daily. Take after 12 weeks for prevention of preeclampsia later in pregnancy (Patient not taking: Reported on 01/31/2018) 300 tablet 2 Not Taking  . butalbital-acetaminophen-caffeine (FIORICET, ESGIC) 50-325-40 MG tablet Take 1 tablet by mouth every 6 (six) hours as needed for headache. 10 tablet 0 Taking  . cyclobenzaprine (FLEXERIL) 10 MG tablet Take 1 tablet (10 mg total) by mouth 3 (three) times daily as needed for muscle spasms. (Patient not taking: Reported on 01/31/2018) 30 tablet 3 Not Taking  . Elastic Bandages & Supports (COMFORT FIT MATERNITY SUPP SM) MISC 1 Units by Does not apply route daily as needed. (Patient not taking: Reported on 12/20/2017) 1 each 0 Not Taking  . glucose blood (ACCU-CHEK GUIDE) test strip Use as instructed QID 100 each 12 Taking  . Iron Polysacch Cmplx-B12-FA 150-0.025-1 MG CAPS Take 1 tablet by mouth daily.  (Patient not taking: Reported on 12/20/2017) 30 each 2 Not Taking  . NIFEdipine (ADALAT CC) 30 MG 24 hr tablet Take 1 tablet (30 mg total) by mouth daily. 30 tablet 2 Taking  . ondansetron (ZOFRAN ODT) 4 MG disintegrating tablet Take 1 tablet (4 mg total) by mouth every 8 (eight) hours as needed for nausea or vomiting. (Patient not taking: Reported on 01/31/2018) 30 tablet 0 Not Taking  . promethazine (PHENERGAN) 25 MG tablet Take 1 tablet (25 mg total) by mouth every 6 (six) hours as needed for nausea or vomiting. 30 tablet 3 Taking     Review of Systems  All systems reviewed and negative except as stated in HPI  Physical Exam Blood pressure (!) 146/91, pulse (!) 102, temperature 97.6 F (36.4 C), temperature source Oral, resp. rate 20, last menstrual period 05/17/2017, SpO2 100 %, not currently breastfeeding. General appearance: alert, oriented, appears uncomfortable, holding forehead Lungs: normal respiratory effort Heart: regular rate Abdomen: soft, non-tender; gravid, FH appropriate for GA Extremities: No calf swelling or tenderness Presentation: cephalic Fetal monitoring: Baseline FHR 140s. Moderate variability. + acels. No decels.  Uterine activity:  Moderate intensity ctx every 6-8 minutes. Dilation: 4 Effacement (%): 80 Station: -2 Exam by:: Duncan DullWeston,RN  Prenatal labs: ABO, Rh: O/Positive/-- (09/12 1448) Antibody: Negative (09/12 1448) Rubella: <0.90 (09/12 1448) RPR: Non Reactive (09/12 1448)  HBsAg: Negative (09/12 1448)  HIV: Non Reactive (09/12 1448)  GC/Chlamydia: Negative GBS: Negative (12/05 0000)  2-hr GTT: Abnormal Genetic screening: Panorama low risk. Anatomy US: Normal Flu vaccine: 12/20/17 Tdap: Declined  Prenatal Transfer Tool  Maternal Diabetes: Yes:  Diabetes Type:  Diet controlled Genetic Screening: Normal Maternal Ultrasounds/Referrals: Normal Fetal Ultrasounds or other Referrals:  None Maternal Substance Abuse:  No Significant Maternal  Medications:  None Significant Maternal Lab Results: Lab values include: Group B Strep negative  Results for orders placed or performed during the hospital encounter of 02/01/18 (from the past 24 hour(s))  CBC   Collection Time: 02/01/18  8:45 AM  Result Value Ref Range   WBC 13.2 (H) 4.0 - 10.5 K/uL   RBC 3.94 3.87 - 5.11 MIL/uL   Hemoglobin 10.4 (L) 12.0 - 15.0 g/dL   HCT 16.133.2 (L) 09.636.0 - 04.546.0 %   MCV 84.3 80.0 - 100.0 fL   MCH 26.4 26.0 - 34.0 pg   MCHC 31.3 30.0 - 36.0 g/dL   RDW 40.917.4 (H) 81.111.5 - 91.415.5 %   Platelets 223  150 - 400 K/uL   nRBC 0.0 0.0 - 0.2 %  Results for orders placed or performed during the hospital encounter of 01/31/18 (from the past 24 hour(s))  Glucose, capillary   Collection Time: 01/31/18 12:46 PM  Result Value Ref Range   Glucose-Capillary 98 70 - 99 mg/dL  Results for orders placed or performed in visit on 01/31/18 (from the past 24 hour(s))  POCT urinalysis dip (device)   Collection Time: 01/31/18 10:54 AM  Result Value Ref Range   Glucose, UA NEGATIVE NEGATIVE mg/dL   Bilirubin Urine SMALL (A) NEGATIVE   Ketones, ur TRACE (A) NEGATIVE mg/dL   Specific Gravity, Urine 1.015 1.005 - 1.030   Hgb urine dipstick NEGATIVE NEGATIVE   pH 7.0 5.0 - 8.0   Protein, ur 30 (A) NEGATIVE mg/dL   Urobilinogen, UA 2.0 (H) 0.0 - 1.0 mg/dL   Nitrite NEGATIVE NEGATIVE   Leukocytes, UA LARGE (A) NEGATIVE    Patient Active Problem List   Diagnosis Date Noted  . Indication for care in labor or delivery 02/01/2018  . Grand multipara 01/20/2018  . GDM (gestational diabetes mellitus) 12/24/2017  . Chronic hypertension during pregnancy, antepartum 12/03/2017  . Anemia in pregnancy 11/04/2017  . No prenatal care in current pregnancy 08/22/2016  . H/O pre-eclampsia in prior pregnancy, currently pregnant 01/30/2015  . Supervision of high-risk pregnancy 12/19/2014  . Rubella non-immune status, antepartum 02/13/2014    Assessment: Monique Gamble is a 27 y.o. [id number]  at [redacted]w[redacted]d here for SOL.  #Labor: Progressing without augmentation.  #Pain: IV Fentanyl.  #FWB: Cat I #MOF: Bottle #MOC: BTL #Circ:  Outpatient  #cHTN: Will check baseline UPC, HELLP labs. Monitor for severe features.   Cecelia Byars, Student-PA. 02/01/2018, 9:19 AM   Attestation: I have seen this patient and agree with the physician assistant student's documentation. I have examined them separately, and we have discussed the plan of care.   Cristal Deer. Earlene Plater, DO OB/GYN Fellow

## 2018-02-01 NOTE — Anesthesia Procedure Notes (Signed)
Spinal  Patient location during procedure: OR Start time: 02/01/2018 4:20 PM End time: 02/01/2018 4:22 PM Staffing Anesthesiologist: Leonides GrillsEllender, Ryan P, MD Other anesthesia staff: Bethanne GingerLafuria, Briana D, RN Performed: other anesthesia staff  Preanesthetic Checklist Completed: patient identified, site marked, surgical consent, pre-op evaluation, timeout performed, IV checked, risks and benefits discussed and monitors and equipment checked Spinal Block Patient position: sitting Prep: ChloraPrep Patient monitoring: heart rate, continuous pulse ox and blood pressure Approach: midline Location: L4-5 Injection technique: single-shot Needle Needle type: Pencan  Needle gauge: 25 G Assessment Sensory level: T8

## 2018-02-01 NOTE — Progress Notes (Signed)
Patient ID: [name], female   DOB: 1990/11/26, 27 y.o.   MRN: [id number]  Risks of procedure discussed with patient including but not limited to: risk of regret, permanence of method, bleeding, infection, injury to surrounding organs and need for additional procedures.  Failure risk of 1 -2 % with increased risk of ectopic gestation if pregnancy occurs was also discussed with patient.    Levie HeritageStinson, Jacob J, DO 02/01/2018 3:20 PM

## 2018-02-01 NOTE — Progress Notes (Signed)
OB/GYN Faculty Practice: Labor Progress Note  Subjective: Doing well, uncomfortable with contractions.   Objective: BP (!) 144/88   Pulse 92   Temp 98.4 F (36.9 C) (Oral)   Resp 16   Ht 5\' 6"  (1.676 m)   Wt 84.8 kg   LMP 05/17/2017 (Approximate) Comment: pt had a miscarriage in middle March  SpO2 100%   BMI 30.18 kg/m  Gen: well-appearing, NAD Dilation: 6 Effacement (%): 80 Cervical Position: Middle Station: 0 Presentation: Vertex Exam by:: Dr Earlene PlaterWallace  Assessment and Plan: 27 y.o. [id number] 2627w1d here with SOL.   Labor: Small interval change. AROM large amount of clear fluid, bloody show noted. -- pain control: fentanyl, would prefer to avoid epidural -- PPH Risk: low  Fetal Well-Being: EFW 7lbs by Leopold's. Cephalic by sutures.  -- Category I - continuous fetal monitoring  -- GBS negative   CHTN: BP moderate range. Did not take asa, BP meds prescribed in pregnancy. HELLP labs wnl, UPC 0.18, AST/ALT, Cr and plt wnl. -- continue to monitor  -- treat headache with tylenol, will start Mg++ if no improvement   Tiffiny Worthy S. Earlene PlaterWallace, DO OB/GYN Fellow, Faculty Practice  1:49 PM

## 2018-02-01 NOTE — Anesthesia Preprocedure Evaluation (Addendum)
Anesthesia Evaluation  Patient identified by MRN, date of birth, ID band Patient awake    Reviewed: Allergy & Precautions, NPO status , Patient's Chart, lab work & pertinent test results  Airway Mallampati: III  TM Distance: >3 FB Neck ROM: Full    Dental no notable dental hx.    Pulmonary neg pulmonary ROS,    Pulmonary exam normal breath sounds clear to auscultation       Cardiovascular hypertension, Normal cardiovascular exam Rhythm:Regular Rate:Normal     Neuro/Psych  Headaches, PSYCHIATRIC DISORDERS ADHD (attention deficit hyperactivity disorder)   GI/Hepatic negative GI ROS, Neg liver ROS,   Endo/Other  diabetes  Renal/GU negative Renal ROS     Musculoskeletal negative musculoskeletal ROS (+)   Abdominal (+) + obese,   Peds  Hematology  (+) anemia ,   Anesthesia Other Findings desires sterilization  Reproductive/Obstetrics                             Anesthesia Physical Anesthesia Plan  ASA: III  Anesthesia Plan: Spinal   Post-op Pain Management:    Induction: Intravenous  PONV Risk Score and Plan: 2 and Ondansetron, Dexamethasone, Treatment may vary due to age or medical condition and Propofol infusion  Airway Management Planned: Natural Airway  Additional Equipment:   Intra-op Plan:   Post-operative Plan:   Informed Consent: I have reviewed the patients History and Physical, chart, labs and discussed the procedure including the risks, benefits and alternatives for the proposed anesthesia with the patient or authorized representative who has indicated his/her understanding and acceptance.   Dental advisory given  Plan Discussed with: CRNA  Anesthesia Plan Comments:        Anesthesia Quick Evaluation

## 2018-02-01 NOTE — Anesthesia Pain Management Evaluation Note (Signed)
  CRNA Pain Management Visit Note  Patient: Rene PaciQuendra L Paules, 27 y.o., female  "Hello I am a member of the anesthesia team at [facility]. We have an anesthesia team available at all times to provide care throughout the hospital, including epidural management and anesthesia for C-section. I don't know your plan for the delivery whether it a natural birth, water birth, IV sedation, nitrous supplementation, doula or epidural, but we want to meet your pain goals."   1.Was your pain managed to your expectations on prior hospitalizations?   Yes   2.What is your expectation for pain management during this hospitalization?     Labor support without medications and IV pain meds  3.How can we help you reach that goal? natural  Record the patient's initial score and the patient's pain goal.   Pain: 10  Pain Goal: 10 [facility] wants you to be able to say your pain was always managed very well.  Ezekeil Bethel 02/01/2018

## 2018-02-01 NOTE — Anesthesia Postprocedure Evaluation (Signed)
Anesthesia Post Note  Patient: Monique Gamble  Procedure(s) Performed: POST PARTUM TUBAL LIGATION (Bilateral Abdomen)     Patient location during evaluation: PACU Anesthesia Type: Spinal Level of consciousness: oriented and awake and alert Pain management: pain level controlled Vital Signs Assessment: post-procedure vital signs reviewed and stable Respiratory status: spontaneous breathing, respiratory function stable and patient connected to nasal cannula oxygen Cardiovascular status: blood pressure returned to baseline and stable Postop Assessment: no headache, no backache, no apparent nausea or vomiting and spinal receding Anesthetic complications: no    Last Vitals:  Vitals:   02/01/18 1829 02/01/18 1940  BP: 136/86 125/78  Pulse: 79 82  Resp: 17 18  Temp:  37.1 C  SpO2: 100% 100%    Last Pain:  Vitals:   02/01/18 1940  TempSrc: Oral  PainSc: 8    Pain Goal:                 Catheryn Baconyan P Ellender

## 2018-02-02 LAB — RPR: RPR Ser Ql: NONREACTIVE

## 2018-02-02 MED ORDER — AMLODIPINE BESYLATE 5 MG PO TABS
5.0000 mg | ORAL_TABLET | Freq: Every day | ORAL | Status: DC
  Administered 2018-02-02: 5 mg via ORAL
  Filled 2018-02-02 (×2): qty 1

## 2018-02-02 NOTE — Progress Notes (Signed)
POSTPARTUM PROGRESS NOTE  Post Partum Day 1  Subjective:  Monique Gamble is a 27 y.o. [id number] s/p SVD at [redacted]w[redacted]d  She reports she is doing well. No acute events overnight. She denies any problems with ambulating, voiding or po intake. Denies nausea or vomiting.  Pain is well controlled.  Lochia is improving. Hx of cHTN but has not been taking her nifedipine due to intolerable side effects. Denies headache, vision changes, RUQ tenderness, extremity swelling.   Objective: Blood pressure 135/88, pulse 72, temperature 98.1 F (36.7 C), temperature source Oral, resp. rate 18, height 5' 6"  (1.676 m), weight 84.8 kg, last menstrual period 05/17/2017, SpO2 100 %, unknown if currently breastfeeding.  Physical Exam:  General: alert, cooperative and no distress Chest: no respiratory distress Heart:regular rate, distal pulses intact Abdomen: soft. Mildly tender near BTL incision site.  Uterine Fundus: firm, appropriately tender DVT Evaluation: No calf swelling or tenderness Extremities: No edema Skin: warm, dry  Recent Labs    02/01/18 0845  HGB 10.4*  HCT 33.2*    Assessment/Plan: Monique BASKINSis a 27y.o. [id number]/p SVD at 335w1d PPD#1 - Doing well - Routine postpartum care - H&H stable - cHTN: BP this AM 130s/80s. Pt asymptomatic. UPC 0.18, otherwise all other PEC labs WNL. Discharge home on Amlodipine 63m21m - A1GDM: CBG yesterday 104>118. Will recheck today. - SW Consult pending regarding Edinburgh postnatal depression scale score - MMR vaccine today  Contraception: BTL  Feeding: Bottle Circumcision: Outpatient  Dispo: Plan for discharge tomorrow.   LOS: 1 day   Mikala Podoll, PA-S. 02/02/2018, 7:33 AM
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
## 2018-02-03 ENCOUNTER — Encounter (HOSPITAL_COMMUNITY): Payer: Self-pay

## 2018-02-03 ENCOUNTER — Encounter (HOSPITAL_COMMUNITY): Payer: Self-pay | Admitting: Obstetrics and Gynecology

## 2018-02-03 LAB — GLUCOSE, CAPILLARY: Glucose-Capillary: 85 mg/dL (ref 70–99)

## 2018-02-03 MED ORDER — DIBUCAINE 1 % RE OINT: 1.0000 "application " | TOPICAL_OINTMENT | RECTAL | 0 refills | Status: DC | PRN

## 2018-02-03 MED ORDER — AMLODIPINE BESYLATE 5 MG PO TABS: 5.0000 mg | ORAL_TABLET | Freq: Every day | ORAL | 3 refills | Status: DC

## 2018-02-03 MED ORDER — AMLODIPINE BESYLATE 10 MG PO TABS
10.0000 mg | ORAL_TABLET | Freq: Every day | ORAL | Status: DC
  Administered 2018-02-03: 10 mg via ORAL
  Filled 2018-02-03: qty 1

## 2018-02-03 MED ORDER — MEASLES, MUMPS & RUBELLA VAC IJ SOLR: 0.5000 mL | Freq: Once | INTRAMUSCULAR | 0 refills | Status: AC

## 2018-02-03 MED ORDER — BENZOCAINE-MENTHOL 20-0.5 % EX AERO: 1.0000 "application " | INHALATION_SPRAY | CUTANEOUS | 0 refills | Status: DC | PRN

## 2018-02-03 MED ORDER — IBUPROFEN 600 MG PO TABS: 600.0000 mg | ORAL_TABLET | Freq: Four times a day (QID) | ORAL | 0 refills | Status: DC

## 2018-02-03 MED ORDER — WITCH HAZEL-GLYCERIN EX PADS: 1.0000 "application " | MEDICATED_PAD | CUTANEOUS | 12 refills | Status: DC | PRN

## 2018-02-03 MED ORDER — PRENATAL MULTIVITAMIN CH: 1.0000 | ORAL_TABLET | Freq: Every day | ORAL | 3 refills | Status: DC

## 2018-02-03 MED ORDER — TETANUS-DIPHTH-ACELL PERTUSSIS 5-2.5-18.5 LF-MCG/0.5 IM SUSP: 0.5000 mL | Freq: Once | INTRAMUSCULAR | 0 refills | Status: AC

## 2018-02-03 MED ORDER — COCONUT OIL OIL: 1.0000 "application " | TOPICAL_OIL | 0 refills | Status: DC | PRN

## 2018-02-03 MED ORDER — SENNOSIDES-DOCUSATE SODIUM 8.6-50 MG PO TABS: 2.0000 | ORAL_TABLET | ORAL | 3 refills | Status: DC

## 2018-02-03 MED ORDER — AMLODIPINE BESYLATE 10 MG PO TABS: 10.0000 mg | ORAL_TABLET | Freq: Every day | ORAL | 3 refills | Status: DC

## 2018-02-03 NOTE — Discharge Summary (Signed)
Postpartum Discharge Summary     Patient Name: Monique Gamble DOB: 1991-01-12 MRN: [id number]  Date of admission: 02/01/2018 Delivering Provider: Glenice Bow   Date of discharge: 02/03/2018  Admitting diagnosis: labor Intrauterine pregnancy: [redacted]w[redacted]d    Secondary diagnosis:  Active Problems:   H/O pre-eclampsia in prior pregnancy, currently pregnant   Chronic hypertension during pregnancy, antepartum   GDM (gestational diabetes mellitus)   [place]   Indication for care in labor or delivery  Additional problems: none     Discharge diagnosis: Term Pregnancy Delivered, CHTN and GDM A1                                                                                             Post partum procedures:postpartum tubal ligation  Augmentation: AROM  Complications: None  Hospital course:  Onset of Labor With Vaginal Delivery     27y.o. yo [id number] 338w1das admitted in Latent Labor on 02/01/2018. Patient had an uncomplicated labor course as follows:  Membrane Rupture Time/Date: 1:44 PM ,02/01/2018   Intrapartum Procedures: Episiotomy: None [1]                                         Lacerations:  None [1]  Patient had a delivery of a Viable infant. 02/01/2018  Information for the patient's newborn:  StFionnuala, Hemmerich0[[id number]]Delivery Method: Vaginal, Spontaneous(Filed from Delivery Summary)    Pateint had an uncomplicated postpartum course.  She is ambulating, tolerating a regular diet, passing flatus, and urinating well. Patient is discharged home in stable condition on 02/03/18.   Magnesium Sulfate recieved: No BMZ received: No  Physical exam  Vitals:   02/02/18 1119 02/02/18 1132 02/02/18 2200 02/03/18 0520  BP: (!) 151/99 138/85 139/90 133/85  Pulse: 84 86 70 70  Resp:   18   Temp:   97.7 F (36.5 C) 98.4 F (36.9 C)  TempSrc:   Oral Oral  SpO2:      Weight:      Height:       General: alert, well appearing, ambulating in  room Lochia: appropriate Uterine Fundus: firm Incision: Healing well with no significant drainage DVT Evaluation: No evidence of DVT seen on physical exam. Labs: Lab Results  Component Value Date   WBC 13.2 (H) 02/01/2018   HGB 10.4 (L) 02/01/2018   HCT 33.2 (L) 02/01/2018   MCV 84.3 02/01/2018   PLT 223 02/01/2018   CMP Latest Ref Rng & Units 02/01/2018  Glucose 70 - 99 mg/dL 104(H)  BUN 6 - 20 mg/dL <5(L)  Creatinine 0.44 - 1.00 mg/dL 0.46  Sodium 135 - 145 mmol/L 132(L)  Potassium 3.5 - 5.1 mmol/L 3.4(L)  Chloride 98 - 111 mmol/L 104  CO2 22 - 32 mmol/L 19(L)  Calcium 8.9 - 10.3 mg/dL 8.4(L)  Total Protein 6.5 - 8.1 g/dL 6.7  Total Bilirubin 0.3 - 1.2 mg/dL 1.0  Alkaline Phos 38 - 126 U/L 169(H)  AST 15 - 41 U/L 15  ALT 0 - 44 U/L 6    Discharge instruction: per After Visit Summary and "Baby and Me Booklet".  After visit meds:  Allergies as of 02/03/2018   No Known Allergies     Medication List    STOP taking these medications   aspirin EC 81 MG tablet   promethazine 25 MG tablet Commonly known as:  PHENERGAN     TAKE these medications   acetaminophen 500 MG tablet Commonly known as:  TYLENOL Take 500 mg by mouth every 6 (six) hours as needed for mild pain or moderate pain.   amLODipine 10 MG tablet Commonly known as:  NORVASC Take 1 tablet (10 mg total) by mouth daily.   benzocaine-Menthol 20-0.5 % Aero Commonly known as:  DERMOPLAST Apply 1 application topically as needed for irritation (perineal discomfort).   coconut oil Oil Apply 1 application topically as needed.   dibucaine 1 % Oint Commonly known as:  NUPERCAINAL Place 1 application rectally as needed for hemorrhoids.   ibuprofen 600 MG tablet Commonly known as:  ADVIL,MOTRIN Take 1 tablet (600 mg total) by mouth every 6 (six) hours.   measles, mumps & rubella vaccine injection Commonly known as:  MMR Inject 0.5 mLs into the skin once for 1 dose.   prenatal multivitamin Tabs  tablet Take 1 tablet by mouth daily at 12 noon.   senna-docusate 8.6-50 MG tablet Commonly known as:  Senokot-S Take 2 tablets by mouth daily. Start taking on:  February 04, 2018   Tdap 5-2.5-18.5 LF-MCG/0.5 injection Commonly known as:  BOOSTRIX Inject 0.5 mLs into the muscle once for 1 dose.   witch hazel-glycerin pad Commonly known as:  TUCKS Apply 1 application topically as needed for hemorrhoids.       Diet: routine diet  Activity: Advance as tolerated. Pelvic rest for 6 weeks.   Outpatient follow up:4 weeks Follow up Appt: Future Appointments  Date Time Provider [place]  03/08/2018  5:00 PM Glenice Bow, DO [facility]   Follow up Visit:  Please schedule this patient for Postpartum visit in: 1 week with the following provider: Any provider For C/S patients schedule nurse incision check in weeks 2 weeks: yes Low risk pregnancy complicated by: cHTN, S9WT Delivery mode:  SVD Anticipated Birth Control:  BTL done PP PP Procedures needed: 2 hour GTT, BP check  Schedule Integrated BH visit: no      Newborn Data: Live born female  Birth Weight: 5 lb 12.1 oz (2610 g) APGAR: 8, 9  Newborn Delivery   Birth date/time:  02/01/2018 14:31:00 Delivery type:  Vaginal, Spontaneous     Baby Feeding: Bottle Disposition:home with mother   02/03/2018 Aura Camps, MD

## 2018-02-04 ENCOUNTER — Encounter: Payer: Self-pay | Admitting: Obstetrics and Gynecology

## 2018-02-04 ENCOUNTER — Telehealth: Payer: Self-pay

## 2018-02-04 ENCOUNTER — Other Ambulatory Visit: Payer: Self-pay | Admitting: Obstetrics and Gynecology

## 2018-02-04 MED ORDER — OXYCODONE-ACETAMINOPHEN 5-325 MG PO TABS: 1.0000 | ORAL_TABLET | ORAL | 0 refills | Status: DC | PRN
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
## 2018-02-04 NOTE — Telephone Encounter (Addendum)
Pt called stating that she had a tubal and the ibuprofen is not effective for the pain. Notified Dr. Vergie LivingPickens per provider pt can have an Rx for Percocet and if she could pick up Rx from [facility] due to [facility] closed for the day.  LM for pt that the Rx she requested will be at the Maternity Admissions entrance due to the office being closed. I also stated that she will need to bring photo ID to be able to pick up Rx and that a MyChart message will be sent.  MyChart message sent. Notified MAU that pt is come pick up prescription.

## 2018-02-07 ENCOUNTER — Encounter: Payer: Self-pay | Admitting: Family Medicine

## 2018-02-07 ENCOUNTER — Ambulatory Visit: Payer: Self-pay

## 2018-02-07 ENCOUNTER — Other Ambulatory Visit: Payer: Self-pay

## 2018-02-14 ENCOUNTER — Inpatient Hospital Stay (HOSPITAL_COMMUNITY): Admission: RE | Admit: 2018-02-14 | Payer: Medicaid Other | Source: Ambulatory Visit

## 2018-02-18 ENCOUNTER — Inpatient Hospital Stay (HOSPITAL_COMMUNITY)
Admission: AD | Admit: 2018-02-18 | Discharge: 2018-02-18 | Disposition: A | Discharge status: Home / Self Care | Payer: Medicaid Other | Attending: Obstetrics and Gynecology | Admitting: Obstetrics and Gynecology

## 2018-02-18 ENCOUNTER — Other Ambulatory Visit: Payer: Self-pay

## 2018-02-18 ENCOUNTER — Encounter (HOSPITAL_COMMUNITY): Payer: Self-pay | Admitting: Obstetrics and Gynecology

## 2018-02-18 DIAGNOSIS — O1003 Pre-existing essential hypertension complicating the puerperium: Secondary | ICD-10-CM | POA: Diagnosis not present

## 2018-02-18 DIAGNOSIS — R030 Elevated blood-pressure reading, without diagnosis of hypertension: Secondary | ICD-10-CM | POA: Diagnosis present

## 2018-02-18 LAB — COMPREHENSIVE METABOLIC PANEL
ALT: 16 U/L (ref 0–44)
AST: 34 U/L (ref 15–41)
Albumin: 3.6 g/dL (ref 3.5–5.0)
Alkaline Phosphatase: 98 U/L (ref 38–126)
Anion gap: 8 (ref 5–15)
BUN: 10 mg/dL (ref 6–20)
CHLORIDE: 106 mmol/L (ref 98–111)
CO2: 24 mmol/L (ref 22–32)
Calcium: 8.7 mg/dL — ABNORMAL LOW (ref 8.9–10.3)
Creatinine, Ser: 0.81 mg/dL (ref 0.44–1.00)
GFR calc Af Amer: >60 mL/min (ref >60)
GFR calc non Af Amer: >60 mL/min (ref >60)
Glucose, Bld: 92 mg/dL (ref 70–99)
Potassium: 3.5 mmol/L (ref 3.5–5.1)
Sodium: 138 mmol/L (ref 135–145)
Total Bilirubin: 1.2 mg/dL (ref 0.3–1.2)
Total Protein: 8.7 g/dL — ABNORMAL HIGH (ref 6.5–8.1)

## 2018-02-18 LAB — CBC
HCT: 38.6 % (ref 36.0–46.0)
Hemoglobin: 11.8 g/dL — ABNORMAL LOW (ref 12.0–15.0)
MCH: 26 pg (ref 26.0–34.0)
MCHC: 30.6 g/dL (ref 30.0–36.0)
MCV: 85 fL (ref 80.0–100.0)
Platelets: 311 10*3/uL (ref 150–400)
RBC: 4.54 MIL/uL (ref 3.87–5.11)
RDW: 15.9 % — ABNORMAL HIGH (ref 11.5–15.5)
WBC: 5.6 10*3/uL (ref 4.0–10.5)
nRBC: 0 % (ref 0.0–0.2)

## 2018-02-18 LAB — PROTEIN / CREATININE RATIO, URINE
Creatinine, Urine: 109 mg/dL
Protein Creatinine Ratio: 0.07 mg/mg{Cre} (ref 0.00–0.15)
TOTAL PROTEIN, URINE: 8 mg/dL

## 2018-02-18 MED ORDER — LACTATED RINGERS IV SOLN
INTRAVENOUS | Status: DC
  Administered 2018-02-18: 20:00:00 via INTRAVENOUS

## 2018-02-18 MED ORDER — HYDRALAZINE HCL 20 MG/ML IJ SOLN
10.0000 mg | INTRAMUSCULAR | Status: DC | PRN
  Administered 2018-02-18: 10 mg via INTRAVENOUS
  Filled 2018-02-18: qty 1

## 2018-02-18 MED ORDER — LABETALOL HCL 5 MG/ML IV SOLN
40.0000 mg | INTRAVENOUS | Status: DC | PRN
  Administered 2018-02-18: 40 mg via INTRAVENOUS
  Filled 2018-02-18: qty 8

## 2018-02-18 MED ORDER — ACETAMINOPHEN 500 MG PO TABS
1000.0000 mg | ORAL_TABLET | Freq: Once | ORAL | Status: AC
  Administered 2018-02-18: 1000 mg via ORAL
  Filled 2018-02-18: qty 2

## 2018-02-18 MED ORDER — LISINOPRIL 20 MG PO TABS: 20.0000 mg | ORAL_TABLET | Freq: Every day | ORAL | 0 refills | Status: DC

## 2018-02-18 MED ORDER — LISINOPRIL 20 MG PO TABS
20.0000 mg | ORAL_TABLET | Freq: Once | ORAL | Status: AC
  Administered 2018-02-18: 20 mg via ORAL
  Filled 2018-02-18: qty 1

## 2018-02-18 MED ORDER — HYDRALAZINE HCL 20 MG/ML IJ SOLN
5.0000 mg | INTRAMUSCULAR | Status: DC | PRN
  Administered 2018-02-18: 5 mg via INTRAVENOUS
  Filled 2018-02-18: qty 1

## 2018-02-18 MED ORDER — LABETALOL HCL 5 MG/ML IV SOLN
20.0000 mg | INTRAVENOUS | Status: DC | PRN
  Administered 2018-02-18: 20 mg via INTRAVENOUS
  Filled 2018-02-18: qty 4

## 2018-02-18 NOTE — MAU Note (Signed)
Home nurse was out today, BP was high. Denies HA, epigastric pain, visual changes or swelling.vag del 12/17.

## 2018-02-18 NOTE — MAU Provider Note (Signed)
History     CSN: [id number]  Arrival date and time: 02/18/18 1830   First Provider Initiated Contact with Patient 02/18/18 1937      Chief Complaint  Patient presents with  . Hypertension   HPI  [name] is a 28 y.o. year old [id number] female who is 2 wks PP presents to MAU reporting elevated BPs when the [facility] RN came out to her house today. She is supposed to take Norvasc daily, but states "it gives me headaches."  She reports taking her dose of  after the RN told her her BP was elevated. She denies visual changes, epigastric pain or swelling.  Past Medical History:  Diagnosis Date  . ADHD (attention deficit hyperactivity disorder)   . Chlamydia   . Gestational diabetes   . Headache   . History of anemia   . Hypertension   . Pregnancy induced hypertension     Past Surgical History:  Procedure Laterality Date  . INDUCED ABORTION    . TUBAL LIGATION Bilateral 02/01/2018   Procedure: POST PARTUM TUBAL LIGATION;  Surgeon: [place] BingPickens, Charlie, MD;  [facility];  Service: Gynecology;  Laterality: Bilateral;    Family History  Problem Relation Age of Onset  . Hypertension Mother   . Hypertension Father   . Diabetes Sister   . Anesthesia problems Neg Hx     Social History   Tobacco Use  . Smoking status: Never Smoker  . Smokeless tobacco: Never Used  Substance Use Topics  . Alcohol use: No  . Drug use: Yes    Types: Marijuana    Comment: last use 03 Jul 2017    Allergies: No Known Allergies  Medications Prior to Admission  Medication Sig Dispense Refill Last Dose  . acetaminophen (TYLENOL) 500 MG tablet Take 500 mg by mouth every 6 (six) hours as needed for mild pain or moderate pain.   Past Week at Unknown time  . amLODipine (NORVASC) 10 MG tablet Take 1 tablet (10 mg total) by mouth daily. 30 tablet 3   . benzocaine-Menthol (DERMOPLAST) 20-0.5 % AERO Apply 1 application topically as needed for irritation (perineal discomfort). 78  g 0   . coconut oil OIL Apply 1 application topically as needed. 100 mL 0   . dibucaine (NUPERCAINAL) 1 % OINT Place 1 application rectally as needed for hemorrhoids. 28 g 0   . ibuprofen (ADVIL,MOTRIN) 600 MG tablet Take 1 tablet (600 mg total) by mouth every 6 (six) hours. 30 tablet 0   . oxyCODONE-acetaminophen (PERCOCET/ROXICET) 5-325 MG tablet Take 1 tablet by mouth every 4 (four) hours as needed for severe pain. 8 tablet 0   . Prenatal Vit-Fe Fumarate-FA (PRENATAL MULTIVITAMIN) TABS tablet Take 1 tablet by mouth daily at 12 noon. 30 tablet 3   . senna-docusate (SENOKOT-S) 8.6-50 MG tablet Take 2 tablets by mouth daily. 60 tablet 3   . witch hazel-glycerin (TUCKS) pad Apply 1 application topically as needed for hemorrhoids. 40 each 12     Review of Systems  Constitutional: Negative.   HENT: Negative.   Eyes: Negative.   Respiratory: Negative.   Cardiovascular: Negative.   Gastrointestinal: Negative.   Endocrine: Negative.   Genitourinary: Negative.   Musculoskeletal: Negative.   Skin: Negative.   Allergic/Immunologic: Negative.   Neurological: Negative.   Hematological: Negative.   Psychiatric/Behavioral: Negative.    Physical Exam   Patient Vitals for the past 24 hrs:  BP Temp Temp src Pulse Resp SpO2  Weight  02/18/18 2130 (!) 148/81 - - 85 - - -  02/18/18 2115 130/75 - - 88 - - -  02/18/18 2101 129/66 - - 79 - - -  02/18/18 2045 122/70 - - 90 - - -  02/18/18 2031 (!) 146/90 - - 88 - - -  02/18/18 2023 (!) 168/100 - - - - - -  02/18/18 1959 (!) 172/96 - - 77 - - -  02/18/18 1945 (!) 170/101 - - 76 - - -  02/18/18 1931 (!) 172/102 - - 77 - - -  02/18/18 1915 (!) 188/108 - - 72 - - -  02/18/18 1843 (!) 197/107 98.2 F (36.8 C) Oral 71 17 100 % 76.9 kg    Physical Exam  Nursing note and vitals reviewed. Constitutional: She is oriented to person, place, and time. She appears well-developed and well-nourished.  HENT:  Head: Normocephalic and atraumatic.  Eyes: Pupils  are equal, round, and reactive to light.  Neck: Normal range of motion.  Cardiovascular: Normal rate, regular rhythm, normal heart sounds and intact distal pulses.  Respiratory: Effort normal and breath sounds normal.  GI: Soft. Bowel sounds are normal.  Genitourinary:    Genitourinary Comments: Not indicated   Musculoskeletal: Normal range of motion.  Neurological: She is alert and oriented to person, place, and time. She has normal reflexes.  Skin: Skin is warm and dry.  Psychiatric: She has a normal mood and affect. Her behavior is normal. Judgment and thought content normal.    MAU Course  Procedures  MDM CBC CMP P/C Ratio Serial BPs Apresoline Protocol Lisinopril 20 mg po  *Consult with Dr. Emelda FearFerguson @ 2135 - notified of patient's complaints, assessments, lab results, recommended tx plan give 1 does of Lisinopril 20 mg before d/c home, Rx for Lisinopril 20 mg daily along with Norvasc 10 mg, BP recheck on Monday or Tuesday of next week - ok to d/c home  Results for orders placed or performed during the hospital encounter of 02/18/18 (from the past 24 hour(s))  CBC     Status: Abnormal   Collection Time: 02/18/18  6:59 PM  Result Value Ref Range   WBC 5.6 4.0 - 10.5 K/uL   RBC 4.54 3.87 - 5.11 MIL/uL   Hemoglobin 11.8 (L) 12.0 - 15.0 g/dL   HCT 40.938.6 81.136.0 - 91.446.0 %   MCV 85.0 80.0 - 100.0 fL   MCH 26.0 26.0 - 34.0 pg   MCHC 30.6 30.0 - 36.0 g/dL   RDW 78.215.9 (H) 95.611.5 - 21.315.5 %   Platelets 311 150 - 400 K/uL   nRBC 0.0 0.0 - 0.2 %  Comprehensive metabolic panel     Status: Abnormal   Collection Time: 02/18/18  6:59 PM  Result Value Ref Range   Sodium 138 135 - 145 mmol/L   Potassium 3.5 3.5 - 5.1 mmol/L   Chloride 106 98 - 111 mmol/L   CO2 24 22 - 32 mmol/L   Glucose, Bld 92 70 - 99 mg/dL   BUN 10 6 - 20 mg/dL   Creatinine, Ser 0.860.81 0.44 - 1.00 mg/dL   Calcium 8.7 (L) 8.9 - 10.3 mg/dL   Total Protein 8.7 (H) 6.5 - 8.1 g/dL   Albumin 3.6 3.5 - 5.0 g/dL   AST 34 15 - 41  U/L   ALT 16 0 - 44 U/L   Alkaline Phosphatase 98 38 - 126 U/L   Total Bilirubin 1.2 0.3 - 1.2 mg/dL   GFR calc  non Af Amer >60 >60 mL/min   GFR calc Af Amer >60 >60 mL/min   Anion gap 8 5 - 15  Protein / creatinine ratio, urine     Status: None   Collection Time: 02/18/18  7:10 PM  Result Value Ref Range   Creatinine, Urine 109.00 mg/dL   Total Protein, Urine 8 mg/dL   Protein Creatinine Ratio 0.07 0.00 - 0.15 mg/mg[Cre]    Assessment and Plan  Essential hypertension-postpartum - Plan: Discharge patient - Rx for Lisinopril 20 mg daily sent - Information provided on postpartum HTN - BP check with [facility] on Monday 02/21/2018 or Tuesday 02/22/2018 -- msg sent to admin pool to get pt scheduled - Patient verbalized an understanding of the plan of care and agrees.   Raelyn Mora, MSN, CNM 02/18/2018, 7:37 PM

## 2018-02-18 NOTE — Discharge Instructions (Signed)
Postpartum Hypertension  Postpartum hypertension is high blood pressure that remains higher than normal after childbirth. You may not realize that you have postpartum hypertension if your blood pressure is not being checked regularly. In most cases, postpartum hypertension will go away on its own, usually within a week of delivery. However, for some women, medical treatment is required to prevent serious complications, such as seizures or stroke.  What are the causes?  This condition may be caused by one or more of the following:   Hypertension that existed before pregnancy (chronic hypertension).   Hypertension that comes on as a result of pregnancy (gestational hypertension).   Hypertensive disorders during pregnancy (preeclampsia) or seizures in women who have high blood pressure during pregnancy (eclampsia).   A condition in which the liver, platelets, and red blood cells are damaged during pregnancy (HELLP syndrome).   A condition in which the thyroid produces too much hormones (hyperthyroidism).   Other rare problems of the nerves (neurological disorders) or blood disorders.  In some cases, the cause may not be known.  What increases the risk?  The following factors may make you more likely to develop this condition:   Chronic hypertension. In some cases, this may not have been diagnosed before pregnancy.   Obesity.   Type 2 diabetes.   Kidney disease.   History of preeclampsia or eclampsia.   Other medical conditions that change the level of hormones in the body (hormonal imbalance).  What are the signs or symptoms?  As with all types of hypertension, postpartum hypertension may not have any symptoms. Depending on how high your blood pressure is, you may experience:   Headaches. These may be mild, moderate, or severe. They may also be steady, constant, or sudden in onset (thunderclap headache).   Changes in your ability to see (visual changes).   Dizziness.   Shortness of breath.   Swelling  of your hands, feet, lower legs, or face. In some cases, you may have swelling in more than one of these locations.   Heart palpitations or a racing heartbeat.   Difficulty breathing while lying down.   Decrease in the amount of urine that you pass.  Other rare signs and symptoms may include:   Sweating more than usual. This lasts longer than a few days after delivery.   Chest pain.   Sudden dizziness when you get up from sitting or lying down.   Seizures.   Nausea or vomiting.   Abdominal pain.  How is this diagnosed?  This condition may be diagnosed based on the results of a physical exam, blood pressure measurements, and blood and urine tests.  You may also have other tests, such as a CT scan or an MRI, to check for other problems of postpartum hypertension.  How is this treated?  If blood pressure is high enough to require treatment, your options may include:   Medicines to reduce blood pressure (antihypertensives). Tell your health care provider if you are breastfeeding or if you plan to breastfeed. There are many antihypertensive medicines that are safe to take while breastfeeding.   Stopping medicines that may be causing hypertension.   Treating medical conditions that are causing hypertension.   Treating the complications of hypertension, such as seizures, stroke, or kidney problems.  Your health care provider will also continue to monitor your blood pressure closely until it is within a safe range for you.  Follow these instructions at home:   Take over-the-counter and prescription medicines only as   told by your health care provider.   Return to your normal activities as told by your health care provider. Ask your health care provider what activities are safe for you.   Do not use any products that contain nicotine or tobacco, such as cigarettes and e-cigarettes. If you need help quitting, ask your health care provider.   Keep all follow-up visits as told by your health care provider. This  is important.  Contact a health care provider if:   Your symptoms get worse.   You have new symptoms, such as:  ? A headache that does not get better.  ? Dizziness.  ? Visual changes.  Get help right away if:   You suddenly develop swelling in your hands, ankles, or face.   You have sudden, rapid weight gain.   You develop difficulty breathing, chest pain, racing heartbeat, or heart palpitations.   You develop severe pain in your abdomen.   You have any symptoms of a stroke. "BE FAST" is an easy way to remember the main warning signs of a stroke:  ? B - Balance. Signs are dizziness, sudden trouble walking, or loss of balance.  ? E - Eyes. Signs are trouble seeing or a sudden change in vision.  ? F - Face. Signs are sudden weakness or numbness of the face, or the face or eyelid drooping on one side.  ? A - Arms. Signs are weakness or numbness in an arm. This happens suddenly and usually on one side of the body.  ? S - Speech. Signs are sudden trouble speaking, slurred speech, or trouble understanding what people say.  ? T - Time. Time to call emergency services. Write down what time symptoms started.   You have other signs of a stroke, such as:  ? A sudden, severe headache with no known cause.  ? Nausea or vomiting.  ? Seizure.  These symptoms may represent a serious problem that is an emergency. Do not wait to see if the symptoms will go away. Get medical help right away. Call your local emergency services (911 in the [place] Do not drive yourself to the hospital.  Summary   Postpartum hypertension is high blood pressure that remains higher than normal after childbirth.   In most cases, postpartum hypertension will go away on its own, usually within a week of delivery.   For some women, medical treatment is required to prevent serious complications, such as seizures or stroke.  This information is not intended to replace advice given to you by your health care provider. Make sure you discuss any questions  you have with your health care provider.  Document Released: 10/06/2013 Document Revised: 11/23/2016 Document Reviewed: 11/23/2016  Elsevier Interactive Patient Education  2019 [employer]

## 2018-02-22 ENCOUNTER — Ambulatory Visit: Payer: Self-pay

## 2018-02-24 ENCOUNTER — Encounter (HOSPITAL_COMMUNITY): Payer: Self-pay | Admitting: *Deleted

## 2018-02-24 ENCOUNTER — Inpatient Hospital Stay (HOSPITAL_COMMUNITY)
Admission: AD | Admit: 2018-02-24 | Discharge: 2018-02-24 | Disposition: A | Discharge status: Home / Self Care | Payer: Medicaid Other | Source: Ambulatory Visit | Attending: Family Medicine | Admitting: Family Medicine

## 2018-02-24 DIAGNOSIS — R030 Elevated blood-pressure reading, without diagnosis of hypertension: Secondary | ICD-10-CM | POA: Diagnosis present

## 2018-02-24 DIAGNOSIS — O1003 Pre-existing essential hypertension complicating the puerperium: Secondary | ICD-10-CM | POA: Diagnosis not present

## 2018-02-24 DIAGNOSIS — Z9114 Patient's other noncompliance with medication regimen: Secondary | ICD-10-CM | POA: Diagnosis not present

## 2018-02-24 LAB — COMPREHENSIVE METABOLIC PANEL
ALT: 14 U/L (ref 0–44)
AST: 20 U/L (ref 15–41)
Albumin: 3.6 g/dL (ref 3.5–5.0)
Alkaline Phosphatase: 79 U/L (ref 38–126)
Anion gap: 8 (ref 5–15)
BUN: 6 mg/dL (ref 6–20)
CO2: 24 mmol/L (ref 22–32)
CREATININE: 0.72 mg/dL (ref 0.44–1.00)
Calcium: 8.5 mg/dL — ABNORMAL LOW (ref 8.9–10.3)
Chloride: 103 mmol/L (ref 98–111)
GFR calc Af Amer: >60 mL/min (ref >60)
GFR calc non Af Amer: >60 mL/min (ref >60)
Glucose, Bld: 82 mg/dL (ref 70–99)
Potassium: 3.5 mmol/L (ref 3.5–5.1)
Sodium: 135 mmol/L (ref 135–145)
Total Bilirubin: 1.1 mg/dL (ref 0.3–1.2)
Total Protein: 7.6 g/dL (ref 6.5–8.1)

## 2018-02-24 LAB — PROTEIN / CREATININE RATIO, URINE
CREATININE, URINE: 122 mg/dL
Protein Creatinine Ratio: 0.07 mg/mg{Cre} (ref 0.00–0.15)
TOTAL PROTEIN, URINE: 8 mg/dL

## 2018-02-24 LAB — CBC
HCT: 36.1 % (ref 36.0–46.0)
Hemoglobin: 10.8 g/dL — ABNORMAL LOW (ref 12.0–15.0)
MCH: 25.8 pg — ABNORMAL LOW (ref 26.0–34.0)
MCHC: 29.9 g/dL — ABNORMAL LOW (ref 30.0–36.0)
MCV: 86.2 fL (ref 80.0–100.0)
Platelets: 288 10*3/uL (ref 150–400)
RBC: 4.19 MIL/uL (ref 3.87–5.11)
RDW: 16 % — ABNORMAL HIGH (ref 11.5–15.5)
WBC: 5.3 10*3/uL (ref 4.0–10.5)
nRBC: 0 % (ref 0.0–0.2)

## 2018-02-24 MED ORDER — LISINOPRIL 20 MG PO TABS
20.0000 mg | ORAL_TABLET | Freq: Once | ORAL | Status: AC
  Administered 2018-02-24: 20 mg via ORAL
  Filled 2018-02-24: qty 1

## 2018-02-24 MED ORDER — HYDRALAZINE HCL 20 MG/ML IJ SOLN
5.0000 mg | INTRAMUSCULAR | Status: DC | PRN
  Administered 2018-02-24: 5 mg via INTRAVENOUS
  Filled 2018-02-24: qty 1

## 2018-02-24 MED ORDER — LABETALOL HCL 5 MG/ML IV SOLN
20.0000 mg | INTRAVENOUS | Status: DC | PRN
  Administered 2018-02-24: 20 mg via INTRAVENOUS
  Filled 2018-02-24: qty 4

## 2018-02-24 MED ORDER — BUTALBITAL-APAP-CAFFEINE 50-325-40 MG PO TABS: 1.0000 | ORAL_TABLET | Freq: Four times a day (QID) | ORAL | 0 refills | Status: DC | PRN

## 2018-02-24 MED ORDER — BUTALBITAL-APAP-CAFFEINE 50-325-40 MG PO TABS
2.0000 | ORAL_TABLET | Freq: Four times a day (QID) | ORAL | Status: DC | PRN
  Administered 2018-02-24: 2 via ORAL
  Filled 2018-02-24: qty 2

## 2018-02-24 MED ORDER — LABETALOL HCL 5 MG/ML IV SOLN
40.0000 mg | INTRAVENOUS | Status: DC | PRN
  Administered 2018-02-24: 40 mg via INTRAVENOUS
  Filled 2018-02-24: qty 8

## 2018-02-24 MED ORDER — AMLODIPINE BESYLATE 10 MG PO TABS
10.0000 mg | ORAL_TABLET | Freq: Once | ORAL | Status: AC
  Administered 2018-02-24: 10 mg via ORAL
  Filled 2018-02-24: qty 1

## 2018-02-24 MED ORDER — HYDRALAZINE HCL 20 MG/ML IJ SOLN
10.0000 mg | INTRAMUSCULAR | Status: DC | PRN
  Administered 2018-02-24: 10 mg via INTRAVENOUS
  Filled 2018-02-24: qty 1

## 2018-02-24 NOTE — MAU Note (Signed)
Pt reports s/p vaginal delivery 12/17, nurse visit today and b/p was 180/118

## 2018-02-24 NOTE — MAU Provider Note (Signed)
History     Patient Active Problem List   Diagnosis Date Noted  . Essential hypertension-postpartum 02/18/2018  . Indication for care in labor or delivery 02/01/2018  . Grand multipara 01/20/2018  . GDM (gestational diabetes mellitus) 12/24/2017  . Chronic hypertension during pregnancy, antepartum 12/03/2017  . Anemia in pregnancy 11/04/2017  . No prenatal care in current pregnancy 08/22/2016  . H/O pre-eclampsia in prior pregnancy, currently pregnant 01/30/2015  . Supervision of high-risk pregnancy 12/19/2014  . Rubella non-immune status, antepartum 02/13/2014    Chief Complaint  Patient presents with  . Hypertension   Monique Gamble is a 28 y.o. [id number] at 3 weeks postpartum s/p SVD who presents for Hypertension.  She states she was seen by the [facility] and had an elevated BP of 180/118. She has been taking 100mg  Labetolol for the last week but missed her dose this morning. She took it after the blood pressure check this afternoon around 2pm. She denies HA today, visual changes, SOB, CP, abdominal pain, or edema. She overall feels well.  She was last seen on 02/18/18 for elevated BP and after consulting Dr. Emelda FearFerguson, it was recommended that she take Lisinopril 20 mg daily and Norvasc 10 mg. She states that she did not realize that she was supposed to take these medications and has only been taking Labetolol BID. She has a hx of non-compliance with Norvasc due to associated HA's but states today that she has had HA's when not taking this medication and no longer thinks they are associated. She is willing to try this medication again. She also requested a refill for Fiorcet for periodic migraines.         OB History    Gravida  8   Para  6   Term  6   Preterm  0   AB  2   Living  6     SAB  1   TAB  1   Ectopic  0   Multiple  0   Live Births  6           Past Medical History:  Diagnosis Date  . ADHD (attention deficit hyperactivity disorder)    . Chlamydia   . Gestational diabetes   . Headache   . History of anemia   . Hypertension   . Pregnancy induced hypertension     Past Surgical History:  Procedure Laterality Date  . INDUCED ABORTION    . TUBAL LIGATION Bilateral 02/01/2018   Procedure: POST PARTUM TUBAL LIGATION;  Surgeon: Kiowa BingPickens, Charlie, MD;  [facility];  Service: Gynecology;  Laterality: Bilateral;    Family History  Problem Relation Age of Onset  . Hypertension Mother   . Hypertension Father   . Diabetes Sister   . Anesthesia problems Neg Hx     Social History   Tobacco Use  . Smoking status: Never Smoker  . Smokeless tobacco: Never Used  Substance Use Topics  . Alcohol use: No  . Drug use: Yes    Types: Marijuana    Comment: last use 03 Jul 2017    Allergies: No Known Allergies  Medications Prior to Admission  Medication Sig Dispense Refill Last Dose  . acetaminophen (TYLENOL) 500 MG tablet Take 500 mg by mouth every 6 (six) hours as needed for mild pain or moderate pain.   Past Week at Unknown time  . amLODipine (NORVASC) 10 MG tablet Take 1 tablet (10 mg total) by mouth daily. 30  tablet 3   . benzocaine-Menthol (DERMOPLAST) 20-0.5 % AERO Apply 1 application topically as needed for irritation (perineal discomfort). 78 g 0   . coconut oil OIL Apply 1 application topically as needed. 100 mL 0   . dibucaine (NUPERCAINAL) 1 % OINT Place 1 application rectally as needed for hemorrhoids. 28 g 0   . ibuprofen (ADVIL,MOTRIN) 600 MG tablet Take 1 tablet (600 mg total) by mouth every 6 (six) hours. 30 tablet 0   . lisinopril (PRINIVIL,ZESTRIL) 20 MG tablet Take 1 tablet (20 mg total) by mouth daily. 30 tablet 0   . oxyCODONE-acetaminophen (PERCOCET/ROXICET) 5-325 MG tablet Take 1 tablet by mouth every 4 (four) hours as needed for severe pain. 8 tablet 0   . Prenatal Vit-Fe Fumarate-FA (PRENATAL MULTIVITAMIN) TABS tablet Take 1 tablet by mouth daily at 12 noon. 30 tablet 3   .  senna-docusate (SENOKOT-S) 8.6-50 MG tablet Take 2 tablets by mouth daily. 60 tablet 3   . witch hazel-glycerin (TUCKS) pad Apply 1 application topically as needed for hemorrhoids. 40 each 12     Review of Systems  Constitutional: Negative for chills and fever.  Respiratory: Negative for shortness of breath.   Cardiovascular: Negative for chest pain.  Gastrointestinal: Negative for abdominal pain, nausea and vomiting.  Neurological: Negative for dizziness and headaches.   Physical Exam   Blood pressure (!) 194/118, pulse 64, temperature 98.6 F (37 C), temperature source Oral, resp. rate 15, height 5\' 6"  (1.676 m), weight 77.6 kg, SpO2 100 %, not currently breastfeeding.  Vitals:   02/24/18 1900 02/24/18 1921 02/24/18 1931 02/24/18 2012  BP: (!) 148/84 (!) 141/86 (!) 157/91 (!) 155/93  Pulse: 88 82 82   Resp:      Temp:      TempSrc:      SpO2:      Weight:      Height:         Results for orders placed or performed during the hospital encounter of 02/24/18 (from the past 24 hour(s))  Protein / creatinine ratio, urine     Status: None   Collection Time: 02/24/18  6:02 PM  Result Value Ref Range   Creatinine, Urine 122.00 mg/dL   Total Protein, Urine 8 mg/dL   Protein Creatinine Ratio 0.07 0.00 - 0.15 mg/mg[Cre]  Comprehensive metabolic panel     Status: Abnormal   Collection Time: 02/24/18  6:10 PM  Result Value Ref Range   Sodium 135 135 - 145 mmol/L   Potassium 3.5 3.5 - 5.1 mmol/L   Chloride 103 98 - 111 mmol/L   CO2 24 22 - 32 mmol/L   Glucose, Bld 82 70 - 99 mg/dL   BUN 6 6 - 20 mg/dL   Creatinine, Ser 3.53 0.44 - 1.00 mg/dL   Calcium 8.5 (L) 8.9 - 10.3 mg/dL   Total Protein 7.6 6.5 - 8.1 g/dL   Albumin 3.6 3.5 - 5.0 g/dL   AST 20 15 - 41 U/L   ALT 14 0 - 44 U/L   Alkaline Phosphatase 79 38 - 126 U/L   Total Bilirubin 1.1 0.3 - 1.2 mg/dL   GFR calc non Af Amer >60 >60 mL/min   GFR calc Af Amer >60 >60 mL/min   Anion gap 8 5 - 15  CBC     Status: Abnormal    Collection Time: 02/24/18  6:10 PM  Result Value Ref Range   WBC 5.3 4.0 - 10.5 K/uL   RBC 4.19 3.87 -  5.11 MIL/uL   Hemoglobin 10.8 (L) 12.0 - 15.0 g/dL   HCT 09.8 11.9 - 14.7 %   MCV 86.2 80.0 - 100.0 fL   MCH 25.8 (L) 26.0 - 34.0 pg   MCHC 29.9 (L) 30.0 - 36.0 g/dL   RDW 82.9 (H) 56.2 - 13.0 %   Platelets 288 150 - 400 K/uL   nRBC 0.0 0.0 - 0.2 %     Physical Exam  Constitutional: She is oriented to person, place, and time. She appears well-developed and well-nourished.  HENT:  Head: Normocephalic and atraumatic.  Eyes: Conjunctivae are normal.  Neck: Normal range of motion.  Cardiovascular: Normal rate, regular rhythm and normal heart sounds.  Respiratory: Effort normal and breath sounds normal.  GI: Soft.  Musculoskeletal: Normal range of motion.        General: No edema.  Neurological: She is alert and oriented to person, place, and time.  Skin: Skin is warm and dry.  Psychiatric: She has a normal mood and affect. Her behavior is normal.     MDM - CBC, CMP, Protein/Creatinine ratio  - Administered 15mg  hydralazine, 60mg  labetolol  ED Course  Assessment: 3 weeks Postpartum CHTN Medication Noncompliance  Plan: -PIH Labs Pending -Clarification of current medication medication was completed as based on plan implemented on Feb 18, 2017 -Educated on need to take Lisinopril 20mg  and Norvasc 10mg  daily with follow up on Monday or Tuesday for BP check. -Give IV antihypertensives per protocol -Will monitor until improvement   Follow Up (7:57 PM) -Noted improvement in BP -Give fiorcet for HA -PIH Labs Negative -Dr. Karolee Ohs consulted and agrees with plan as above -Precautions given including who and when to call for worsening or onset of new symptoms -Rx for Fiorcet sent to pharmacy on file -Encouraged to call or return to MAU if symptoms worsen or with the onset of new symptoms. -Discharged to home in improved condition -Note to admin pool for scheduling of BP  appt as recommended   Cherre Robins CNM, MSN 02/24/2018 6:56 PM

## 2018-02-24 NOTE — Discharge Instructions (Signed)
Managing Your Hypertension  Hypertension is commonly called high blood pressure. This is when the force of your blood pressing against the walls of your arteries is too strong. Arteries are blood vessels that carry blood from your heart throughout your body. Hypertension forces the heart to work harder to pump blood, and may cause the arteries to become narrow or stiff. Having untreated or uncontrolled hypertension can cause heart attack, stroke, kidney disease, and other problems.  What are blood pressure readings?  A blood pressure reading consists of a higher number over a lower number. Ideally, your blood pressure should be below 120/80. The first ("top") number is called the systolic pressure. It is a measure of the pressure in your arteries as your heart beats. The second ("bottom") number is called the diastolic pressure. It is a measure of the pressure in your arteries as the heart relaxes.  What does my blood pressure reading mean?  Blood pressure is classified into four stages. Based on your blood pressure reading, your health care provider may use the following stages to determine what type of treatment you need, if any. Systolic pressure and diastolic pressure are measured in a unit called mm Hg.  Normal   Systolic pressure: below 120.   Diastolic pressure: below 80.  Elevated   Systolic pressure: 120-129.   Diastolic pressure: below 80.  Hypertension stage 1   Systolic pressure: 130-139.   Diastolic pressure: 80-89.  Hypertension stage 2   Systolic pressure: 140 or above.   Diastolic pressure: 90 or above.  What health risks are associated with hypertension?  Managing your hypertension is an important responsibility. Uncontrolled hypertension can lead to:   A heart attack.   A stroke.   A weakened blood vessel (aneurysm).   Heart failure.   Kidney damage.   Eye damage.   Metabolic syndrome.   Memory and concentration problems.  What changes can I make to manage my  hypertension?  Hypertension can be managed by making lifestyle changes and possibly by taking medicines. Your health care provider will help you make a plan to bring your blood pressure within a normal range.  Eating and drinking     Eat a diet that is high in fiber and potassium, and low in salt (sodium), added sugar, and fat. An example eating plan is called the DASH (Dietary Approaches to Stop Hypertension) diet. To eat this way:  ? Eat plenty of fresh fruits and vegetables. Try to fill half of your plate at each meal with fruits and vegetables.  ? Eat whole grains, such as whole wheat pasta, brown rice, or whole grain bread. Fill about one quarter of your plate with whole grains.  ? Eat low-fat diary products.  ? Avoid fatty cuts of meat, processed or cured meats, and poultry with skin. Fill about one quarter of your plate with lean proteins such as fish, chicken without skin, beans, eggs, and tofu.  ? Avoid premade and processed foods. These tend to be higher in sodium, added sugar, and fat.   Reduce your daily sodium intake. Most people with hypertension should eat less than 1,500 mg of sodium a day.   Limit alcohol intake to no more than 1 drink a day for nonpregnant women and 2 drinks a day for men. One drink equals 12 oz of beer, 5 oz of wine, or 1 oz of hard liquor.  Lifestyle   Work with your health care provider to maintain a healthy body weight, or to lose   weight. Ask what an ideal weight is for you.   Get at least 30 minutes of exercise that causes your heart to beat faster (aerobic exercise) most days of the week. Activities may include walking, swimming, or biking.   Include exercise to strengthen your muscles (resistance exercise), such as weight lifting, as part of your weekly exercise routine. Try to do these types of exercises for 30 minutes at least 3 days a week.   Do not use any products that contain nicotine or tobacco, such as cigarettes and e-cigarettes. If you need help quitting,  ask your health care provider.   Control any long-term (chronic) conditions you have, such as high cholesterol or diabetes.  Monitoring   Monitor your blood pressure at home as told by your health care provider. Your personal target blood pressure may vary depending on your medical conditions, your age, and other factors.   Have your blood pressure checked regularly, as often as told by your health care provider.  Working with your health care provider   Review all the medicines you take with your health care provider because there may be side effects or interactions.   Talk with your health care provider about your diet, exercise habits, and other lifestyle factors that may be contributing to hypertension.   Visit your health care provider regularly. Your health care provider can help you create and adjust your plan for managing hypertension.  Will I need medicine to control my blood pressure?  Your health care provider may prescribe medicine if lifestyle changes are not enough to get your blood pressure under control, and if:   Your systolic blood pressure is 130 or higher.   Your diastolic blood pressure is 80 or higher.  Take medicines only as told by your health care provider. Follow the directions carefully. Blood pressure medicines must be taken as prescribed. The medicine does not work as well when you skip doses. Skipping doses also puts you at risk for problems.  Contact a health care provider if:   You think you are having a reaction to medicines you have taken.   You have repeated (recurrent) headaches.   You feel dizzy.   You have swelling in your ankles.   You have trouble with your vision.  Get help right away if:   You develop a severe headache or confusion.   You have unusual weakness or numbness, or you feel faint.   You have severe pain in your chest or abdomen.   You vomit repeatedly.   You have trouble breathing.  Summary   Hypertension is when the force of blood pumping  through your arteries is too strong. If this condition is not controlled, it may put you at risk for serious complications.   Your personal target blood pressure may vary depending on your medical conditions, your age, and other factors. For most people, a normal blood pressure is less than 120/80.   Hypertension is managed by lifestyle changes, medicines, or both. Lifestyle changes include weight loss, eating a healthy, low-sodium diet, exercising more, and limiting alcohol.  This information is not intended to replace advice given to you by your health care provider. Make sure you discuss any questions you have with your health care provider.  Document Released: 10/28/2011 Document Revised: 01/01/2016 Document Reviewed: 01/01/2016  Elsevier Interactive Patient Education  2019 [employer]

## 2018-02-24 NOTE — MAU Provider Note (Addendum)
History    Chief Complaint  Patient presents with  . Hypertension   Monique Gamble is a 40 YOF with hx of cHTN and pre-eclampsia who is 4 weeks post-partum from SVD and presents with elevated BP. She had a BP check today and it was found to be 180/118. She has been taking 100mg  Labetolol for the last week but missed her dose this morning. She took it after the blood pressure check this afternoon around 2pm. She denies HA today, visual changes, SOB, CP, abdominal pain, or edema. She overall feels well.  She was last seen on 02/18/18 for elevated BP and after consulting Dr. Emelda Fear, it was recommended that she take Lisinopril 20 mg daily and Norvasc 10 mg. She states that she did not realize that she was supposed to take these medications and has only been taking Labetolol BID. She has a hx of non-compliance with Norvasc due to associated HA's but states today that she has had HA's when not taking this medication and no longer thinks they are associated. She is willing to try this medication again.  She also requested a refill for Norvasc for periodic migraines.    OB History    Gravida  8   Para  6   Term  6   Preterm  0   AB  2   Living  6     SAB  1   TAB  1   Ectopic  0   Multiple  0   Live Births  6           Past Medical History:  Diagnosis Date  . ADHD (attention deficit hyperactivity disorder)   . Chlamydia   . Gestational diabetes   . Headache   . History of anemia   . Hypertension   . Pregnancy induced hypertension     Past Surgical History:  Procedure Laterality Date  . INDUCED ABORTION    . TUBAL LIGATION Bilateral 02/01/2018   Procedure: POST PARTUM TUBAL LIGATION;  Surgeon: [place] Bing, MD;  Location: [facility];  Service: Gynecology;  Laterality: Bilateral;    Family History  Problem Relation Age of Onset  . Hypertension Mother   . Hypertension Father   . Diabetes Sister   . Anesthesia problems Neg Hx     Social  History   Tobacco Use  . Smoking status: Never Smoker  . Smokeless tobacco: Never Used  Substance Use Topics  . Alcohol use: No  . Drug use: Yes    Types: Marijuana    Comment: last use 03 Jul 2017    Allergies: No Known Allergies  Medications Prior to Admission  Medication Sig Dispense Refill Last Dose  . acetaminophen (TYLENOL) 500 MG tablet Take 500 mg by mouth every 6 (six) hours as needed for mild pain or moderate pain.   Past Week at Unknown time  . amLODipine (NORVASC) 10 MG tablet Take 1 tablet (10 mg total) by mouth daily. 30 tablet 3   . benzocaine-Menthol (DERMOPLAST) 20-0.5 % AERO Apply 1 application topically as needed for irritation (perineal discomfort). 78 g 0   . coconut oil OIL Apply 1 application topically as needed. 100 mL 0   . dibucaine (NUPERCAINAL) 1 % OINT Place 1 application rectally as needed for hemorrhoids. 28 g 0   . ibuprofen (ADVIL,MOTRIN) 600 MG tablet Take 1 tablet (600 mg total) by mouth every 6 (six) hours. 30 tablet 0   . lisinopril (PRINIVIL,ZESTRIL) 20 MG tablet Take  1 tablet (20 mg total) by mouth daily. 30 tablet 0   . oxyCODONE-acetaminophen (PERCOCET/ROXICET) 5-325 MG tablet Take 1 tablet by mouth every 4 (four) hours as needed for severe pain. 8 tablet 0   . Prenatal Vit-Fe Fumarate-FA (PRENATAL MULTIVITAMIN) TABS tablet Take 1 tablet by mouth daily at 12 noon. 30 tablet 3   . senna-docusate (SENOKOT-S) 8.6-50 MG tablet Take 2 tablets by mouth daily. 60 tablet 3   . witch hazel-glycerin (TUCKS) pad Apply 1 application topically as needed for hemorrhoids. 40 each 12     Review of Systems  Constitutional: Negative.   Eyes: Negative.   Respiratory: Negative.   Cardiovascular: Negative.   Genitourinary: Negative.   Skin: Negative.   Neurological: Negative.    Physical Exam Blood pressure (!) 193/94, pulse 64, temperature 98.6 F (37 C), temperature source Oral, resp. rate 15, height 5\' 6"  (1.676 m), weight 77.6 kg, SpO2 100 %, not  currently breastfeeding. Physical Exam  Constitutional: She is oriented to person, place, and time. She appears well-nourished.  Cardiovascular: Normal rate and regular rhythm.  Respiratory: Effort normal and breath sounds normal.  GI: Soft. There is no abdominal tenderness. There is no guarding.  Musculoskeletal:        General: No edema.  Neurological: She is alert and oriented to person, place, and time.  Skin: Skin is warm and dry.  Psychiatric: She has a normal mood and affect.   MAU Course Procedures  MDM - CBC, CMP, Protein/Creatinine ratio  - Administered 15mg  hydralazine, 20mg  labetolol. Pressured decreased, most recent BP 157/91. Patient developed a HA after pressures started improving.  - Administered evening dose of lisinopril and norvasc prior to discharge  Assessment and Plan: - Ruled out pre-eclampsia, counseled to take Lisinopril 20 mg daily and Norvasc 10 mg with close follow-up.

## 2018-02-25 ENCOUNTER — Ambulatory Visit: Payer: Self-pay

## 2018-03-02 ENCOUNTER — Ambulatory Visit: Payer: Self-pay

## 2018-03-07 ENCOUNTER — Other Ambulatory Visit: Payer: Self-pay | Admitting: *Deleted

## 2018-03-07 DIAGNOSIS — Z8632 Personal history of gestational diabetes: Secondary | ICD-10-CM

## 2018-03-08 ENCOUNTER — Ambulatory Visit: Payer: Self-pay | Admitting: Family Medicine

## 2018-03-08 ENCOUNTER — Other Ambulatory Visit: Payer: Self-pay

## 2018-03-08 ENCOUNTER — Encounter: Payer: Self-pay | Admitting: Medical

## 2018-03-08 ENCOUNTER — Ambulatory Visit: Payer: Self-pay | Admitting: Medical

## 2018-03-16 ENCOUNTER — Emergency Department (HOSPITAL_COMMUNITY)
Admission: EM | Admit: 2018-03-16 | Discharge: 2018-03-16 | Disposition: A | Discharge status: Home / Self Care | Payer: Medicaid Other | Attending: Emergency Medicine | Admitting: Emergency Medicine

## 2018-03-16 ENCOUNTER — Other Ambulatory Visit: Payer: Self-pay

## 2018-03-16 ENCOUNTER — Encounter (HOSPITAL_COMMUNITY): Payer: Self-pay | Admitting: Emergency Medicine

## 2018-03-16 ENCOUNTER — Emergency Department (HOSPITAL_COMMUNITY): Payer: Medicaid Other

## 2018-03-16 DIAGNOSIS — F909 Attention-deficit hyperactivity disorder, unspecified type: Secondary | ICD-10-CM | POA: Insufficient documentation

## 2018-03-16 DIAGNOSIS — R1111 Vomiting without nausea: Secondary | ICD-10-CM | POA: Diagnosis not present

## 2018-03-16 DIAGNOSIS — R1084 Generalized abdominal pain: Secondary | ICD-10-CM

## 2018-03-16 DIAGNOSIS — Z79899 Other long term (current) drug therapy: Secondary | ICD-10-CM | POA: Diagnosis not present

## 2018-03-16 DIAGNOSIS — R109 Unspecified abdominal pain: Secondary | ICD-10-CM | POA: Diagnosis present

## 2018-03-16 DIAGNOSIS — R42 Dizziness and giddiness: Secondary | ICD-10-CM | POA: Diagnosis not present

## 2018-03-16 DIAGNOSIS — R110 Nausea: Secondary | ICD-10-CM | POA: Diagnosis not present

## 2018-03-16 DIAGNOSIS — E876 Hypokalemia: Secondary | ICD-10-CM | POA: Diagnosis not present

## 2018-03-16 DIAGNOSIS — I10 Essential (primary) hypertension: Secondary | ICD-10-CM | POA: Insufficient documentation

## 2018-03-16 DIAGNOSIS — N390 Urinary tract infection, site not specified: Secondary | ICD-10-CM

## 2018-03-16 DIAGNOSIS — K449 Diaphragmatic hernia without obstruction or gangrene: Secondary | ICD-10-CM | POA: Diagnosis not present

## 2018-03-16 DIAGNOSIS — R509 Fever, unspecified: Secondary | ICD-10-CM | POA: Diagnosis not present

## 2018-03-16 LAB — CBC WITH DIFFERENTIAL/PLATELET
ABS IMMATURE GRANULOCYTES: 0.17 10*3/uL — AB (ref 0.00–0.07)
Basophils Absolute: 0 10*3/uL (ref 0.0–0.1)
Basophils Relative: 0 %
Eosinophils Absolute: 0 10*3/uL (ref 0.0–0.5)
Eosinophils Relative: 0 %
HCT: 35.5 % — ABNORMAL LOW (ref 36.0–46.0)
Hemoglobin: 11.1 g/dL — ABNORMAL LOW (ref 12.0–15.0)
Immature Granulocytes: 1 %
LYMPHS PCT: 8 %
Lymphs Abs: 1.4 10*3/uL (ref 0.7–4.0)
MCH: 26.3 pg (ref 26.0–34.0)
MCHC: 31.3 g/dL (ref 30.0–36.0)
MCV: 84.1 fL (ref 80.0–100.0)
Monocytes Absolute: 1.7 10*3/uL — ABNORMAL HIGH (ref 0.1–1.0)
Monocytes Relative: 9 %
NEUTROS PCT: 82 %
Neutro Abs: 14.9 10*3/uL — ABNORMAL HIGH (ref 1.7–7.7)
Platelets: 126 10*3/uL — ABNORMAL LOW (ref 150–400)
RBC: 4.22 MIL/uL (ref 3.87–5.11)
RDW: 14.9 % (ref 11.5–15.5)
WBC: 18.2 10*3/uL — ABNORMAL HIGH (ref 4.0–10.5)
nRBC: 0 % (ref 0.0–0.2)

## 2018-03-16 LAB — COMPREHENSIVE METABOLIC PANEL
ALT: 10 U/L (ref 0–44)
AST: 15 U/L (ref 15–41)
Albumin: 3.6 g/dL (ref 3.5–5.0)
Alkaline Phosphatase: 64 U/L (ref 38–126)
Anion gap: 11 (ref 5–15)
BUN: 13 mg/dL (ref 6–20)
CHLORIDE: 101 mmol/L (ref 98–111)
CO2: 22 mmol/L (ref 22–32)
Calcium: 8 mg/dL — ABNORMAL LOW (ref 8.9–10.3)
Creatinine, Ser: 1.08 mg/dL — ABNORMAL HIGH (ref 0.44–1.00)
GFR calc Af Amer: >60 mL/min (ref >60)
GFR calc non Af Amer: >60 mL/min (ref >60)
Glucose, Bld: 118 mg/dL — ABNORMAL HIGH (ref 70–99)
Potassium: 2.8 mmol/L — ABNORMAL LOW (ref 3.5–5.1)
SODIUM: 134 mmol/L — AB (ref 135–145)
Total Bilirubin: 1.8 mg/dL — ABNORMAL HIGH (ref 0.3–1.2)
Total Protein: 7.5 g/dL (ref 6.5–8.1)

## 2018-03-16 LAB — URINALYSIS, ROUTINE W REFLEX MICROSCOPIC
BILIRUBIN URINE: NEGATIVE
Glucose, UA: NEGATIVE mg/dL
Ketones, ur: NEGATIVE mg/dL
Leukocytes, UA: NEGATIVE
Nitrite: NEGATIVE
Protein, ur: 100 mg/dL — AB
Specific Gravity, Urine: 1.024 (ref 1.005–1.030)
pH: 5 (ref 5.0–8.0)

## 2018-03-16 LAB — LIPASE, BLOOD: Lipase: 23 U/L (ref 11–51)

## 2018-03-16 LAB — INFLUENZA PANEL BY PCR (TYPE A & B)
Influenza A By PCR: NEGATIVE
Influenza B By PCR: NEGATIVE

## 2018-03-16 LAB — POC URINE PREG, ED: Preg Test, Ur: NEGATIVE

## 2018-03-16 MED ORDER — IOPAMIDOL (ISOVUE-300) INJECTION 61%
INTRAVENOUS | Status: AC
  Filled 2018-03-16: qty 100

## 2018-03-16 MED ORDER — SODIUM CHLORIDE 0.9 % IV BOLUS
1000.0000 mL | Freq: Once | INTRAVENOUS | Status: AC
  Administered 2018-03-16: 1000 mL via INTRAVENOUS

## 2018-03-16 MED ORDER — POTASSIUM CHLORIDE CRYS ER 20 MEQ PO TBCR: 20.0000 meq | EXTENDED_RELEASE_TABLET | Freq: Every day | ORAL | 0 refills | Status: AC

## 2018-03-16 MED ORDER — SODIUM CHLORIDE (PF) 0.9 % IJ SOLN
INTRAMUSCULAR | Status: AC
  Filled 2018-03-16: qty 50

## 2018-03-16 MED ORDER — IBUPROFEN 600 MG PO TABS: 600.0000 mg | ORAL_TABLET | Freq: Four times a day (QID) | ORAL | 0 refills | Status: DC | PRN

## 2018-03-16 MED ORDER — CEPHALEXIN 500 MG PO CAPS: 500.0000 mg | ORAL_CAPSULE | Freq: Four times a day (QID) | ORAL | 0 refills | Status: DC

## 2018-03-16 MED ORDER — SODIUM CHLORIDE 0.9 % IV SOLN
1.0000 g | Freq: Once | INTRAVENOUS | Status: AC
  Administered 2018-03-16: 1 g via INTRAVENOUS
  Filled 2018-03-16: qty 10

## 2018-03-16 MED ORDER — IOPAMIDOL (ISOVUE-300) INJECTION 61%
100.0000 mL | Freq: Once | INTRAVENOUS | Status: AC | PRN
  Administered 2018-03-16: 100 mL via INTRAVENOUS

## 2018-03-16 MED ORDER — POTASSIUM CHLORIDE 10 MEQ/100ML IV SOLN
10.0000 meq | Freq: Once | INTRAVENOUS | Status: AC
  Administered 2018-03-16: 10 meq via INTRAVENOUS
  Filled 2018-03-16: qty 100

## 2018-03-16 MED ORDER — ACETAMINOPHEN 325 MG PO TABS
650.0000 mg | ORAL_TABLET | Freq: Once | ORAL | Status: AC
  Administered 2018-03-16: 650 mg via ORAL
  Filled 2018-03-16: qty 2

## 2018-03-16 MED ORDER — POTASSIUM CHLORIDE CRYS ER 20 MEQ PO TBCR
40.0000 meq | EXTENDED_RELEASE_TABLET | Freq: Once | ORAL | Status: AC
  Administered 2018-03-16: 40 meq via ORAL
  Filled 2018-03-16: qty 2

## 2018-03-16 NOTE — ED Triage Notes (Signed)
Pt brought in by [facility] from home for fever and flu like symptoms for 2 days. Pt denies taking any medications for her symptoms. Pt given tylenol in route but they dont know what was dosage was bc pediatric.

## 2018-03-16 NOTE — ED Provider Notes (Signed)
[facility]-EMERGENCY DEPT Provider Note   CSN: [id number] Arrival date & time: 03/16/18  1306     History   Chief Complaint Chief Complaint  Patient presents with  . Fever  . bodyaches    HPI Monique Gamble is a 28 y.o. female.  The history is provided by the patient. No language interpreter was used.  Fever     28 year old female presenting to the ED via EMS from home for evaluation of abdominal pain.  Patient reports since yesterday she has had fever, chills, body aches, headache, abdominal pain, loose stools and feeling unwell.  She does not complain of any runny nose sneezing coughing sore throat or ear pain.  She does not have any chest pain shortness of breath or productive cough.  Denies any dysuria or hematuria.  Denies vaginal bleeding or vaginal discharge.  She took Tylenol at home yesterday without adequate relief.  She mention other family member had the stomach flu.  She did not have a flu shot.  Past Medical History:  Diagnosis Date  . ADHD (attention deficit hyperactivity disorder)   . Chlamydia   . Gestational diabetes   . Headache   . History of anemia   . Hypertension   . Pregnancy induced hypertension     Patient Active Problem List   Diagnosis Date Noted  . Essential hypertension-postpartum 02/18/2018  . Indication for care in labor or delivery 02/01/2018  . Grand multipara 01/20/2018  . GDM (gestational diabetes mellitus) 12/24/2017  . Chronic hypertension during pregnancy, antepartum 12/03/2017  . Anemia in pregnancy 11/04/2017  . No prenatal care in current pregnancy 08/22/2016  . H/O pre-eclampsia in prior pregnancy, currently pregnant 01/30/2015  . Supervision of high-risk pregnancy 12/19/2014  . Rubella non-immune status, antepartum 02/13/2014    Past Surgical History:  Procedure Laterality Date  . INDUCED ABORTION    . TUBAL LIGATION Bilateral 02/01/2018   Procedure: POST PARTUM TUBAL LIGATION;  Surgeon:  [place] BingPickens, Charlie, MD;  [facility];  Service: Gynecology;  Laterality: Bilateral;     OB History    Gravida  8   Para  6   Term  6   Preterm  0   AB  2   Living  6     SAB  1   TAB  1   Ectopic  0   Multiple  0   Live Births  6            Home Medications    Prior to Admission medications   Medication Sig Start Date End Date Taking? Authorizing Provider  amLODipine (NORVASC) 10 MG tablet Take 1 tablet (10 mg total) by mouth daily. 02/03/18   Gwenevere AbbotPhillip, Nimeka, MD  butalbital-acetaminophen-caffeine (FIORICET, ESGIC) [phone] MG tablet Take 1-2 tablets by mouth every 6 (six) hours as needed for headache. 02/24/18   Gerrit HeckEmly, Jessica, CNM  ibuprofen (ADVIL,MOTRIN) 600 MG tablet Take 1 tablet (600 mg total) by mouth every 6 (six) hours. 02/03/18   Gwenevere AbbotPhillip, Nimeka, MD  lisinopril (PRINIVIL,ZESTRIL) 20 MG tablet Take 1 tablet (20 mg total) by mouth daily. 02/18/18   Raelyn Moraawson, Rolitta, CNM  Prenatal Vit-Fe Fumarate-FA (PRENATAL MULTIVITAMIN) TABS tablet Take 1 tablet by mouth daily at 12 noon. 02/03/18   Gwenevere AbbotPhillip, Nimeka, MD  witch hazel-glycerin (TUCKS) pad Apply 1 application topically as needed for hemorrhoids. 02/03/18   Gwenevere AbbotPhillip, Nimeka, MD    Family History Family History  Problem Relation Age of Onset  . Hypertension Mother   .  Hypertension Father   . Diabetes Sister   . Anesthesia problems Neg Hx     Social History Social History   Tobacco Use  . Smoking status: Never Smoker  . Smokeless tobacco: Never Used  Substance Use Topics  . Alcohol use: No  . Drug use: Yes    Types: Marijuana    Comment: last use 03 Jul 2017     Allergies   Patient has no known allergies.   Review of Systems Review of Systems  Constitutional: Positive for fever.  All other systems reviewed and are negative.    Physical Exam Updated Vital Signs BP 132/88 (BP Location: Right Arm)   Pulse (!) 119   Temp (!) 101.8 F (38.8 C) (Oral)   Resp 20   SpO2 95%    Breastfeeding No   Physical Exam Vitals signs and nursing note reviewed.  Constitutional:      General: She is not in acute distress.    Appearance: She is well-developed.  HENT:     Head: Atraumatic.     Right Ear: Tympanic membrane normal.     Left Ear: Tympanic membrane normal.     Nose: Nose normal.     Mouth/Throat:     Mouth: Mucous membranes are moist.  Eyes:     Extraocular Movements: Extraocular movements intact.     Conjunctiva/sclera: Conjunctivae normal.     Pupils: Pupils are equal, round, and reactive to light.  Neck:     Musculoskeletal: Normal range of motion and neck supple. No neck rigidity.  Cardiovascular:     Rate and Rhythm: Tachycardia present.     Pulses: Normal pulses.     Heart sounds: Normal heart sounds.  Pulmonary:     Effort: Pulmonary effort is normal.     Breath sounds: Normal breath sounds. No stridor. No wheezing, rhonchi or rales.  Abdominal:     Palpations: Abdomen is soft.     Tenderness: There is abdominal tenderness (Diffuse abdominal tenderness without guarding or rebound tenderness.  Negative Murphy sign, no pain at McBurney's point.).  Lymphadenopathy:     Cervical: No cervical adenopathy.  Skin:    Findings: No rash.  Neurological:     Mental Status: She is alert and oriented to person, place, and time.      ED Treatments / Results  Labs (all labs ordered are listed, but only abnormal results are displayed) Labs Reviewed  CBC WITH DIFFERENTIAL/PLATELET - Abnormal; Notable for the following components:      Result Value   WBC 18.2 (*)    Hemoglobin 11.1 (*)    HCT 35.5 (*)    Platelets 126 (*)    Neutro Abs 14.9 (*)    Monocytes Absolute 1.7 (*)    Abs Immature Granulocytes 0.17 (*)    All other components within normal limits  COMPREHENSIVE METABOLIC PANEL - Abnormal; Notable for the following components:   Sodium 134 (*)    Potassium 2.8 (*)    Glucose, Bld 118 (*)    Creatinine, Ser 1.08 (*)    Calcium 8.0 (*)     Total Bilirubin 1.8 (*)    All other components within normal limits  URINALYSIS, ROUTINE W REFLEX MICROSCOPIC - Abnormal; Notable for the following components:   Color, Urine AMBER (*)    APPearance HAZY (*)    Hgb urine dipstick SMALL (*)    Protein, ur 100 (*)    Bacteria, UA MANY (*)    All other components  within normal limits  LIPASE, BLOOD  INFLUENZA PANEL BY PCR (TYPE A & B)  POC URINE PREG, ED    EKG None  Radiology Ct Abdomen Pelvis W Contrast  Result Date: 03/16/2018 CLINICAL DATA:  Abdominal pain with fever EXAM: CT ABDOMEN AND PELVIS WITH CONTRAST TECHNIQUE: Multidetector CT imaging of the abdomen and pelvis was performed using the standard protocol following bolus administration of intravenous contrast. CONTRAST:  ISOVUE-300 IOPAMIDOL (ISOVUE-300) INJECTION 61% COMPARISON:  None. FINDINGS: LOWER CHEST: There is no basilar pleural or apical pericardial effusion. HEPATOBILIARY: The hepatic contours and density are normal. There is no intra- or extrahepatic biliary dilatation. There is cholelithiasis without acute inflammation. PANCREAS: The pancreatic parenchymal contours are normal and there is no ductal dilatation. There is no peripancreatic fluid collection. SPLEEN: Normal. ADRENALS/URINARY TRACT: --Adrenal glands: Normal. --Right kidney/ureter: No hydronephrosis, nephroureterolithiasis, perinephric stranding or solid renal mass. --Left kidney/ureter: No hydronephrosis, nephroureterolithiasis, perinephric stranding or solid renal mass. --Urinary bladder: Normal for degree of distention STOMACH/BOWEL: --Stomach/Duodenum: Small hiatal hernia.  Normal duodenal course. --Small bowel: No dilatation or inflammation. --Colon: No focal abnormality. --Appendix: Normal. VASCULAR/LYMPHATIC: Normal course and caliber of the major abdominal vessels. No abdominal or pelvic lymphadenopathy. REPRODUCTIVE: Normal uterus. Bilateral tubal ligation. Right ovarian cyst measures 2.4 cm.  MUSCULOSKELETAL. No bony spinal canal stenosis or focal osseous abnormality. OTHER: None. IMPRESSION: Normal appendix.  No acute abdominal or pelvic abnormality. Electronically Signed   By: Deatra Robinson M.D.   On: 03/16/2018 18:15    Procedures Procedures (including critical care time)  Medications Ordered in ED Medications  iopamidol (ISOVUE-300) 61 % injection (has no administration in time range)  sodium chloride (PF) 0.9 % injection (has no administration in time range)  cefTRIAXone (ROCEPHIN) 1 g in sodium chloride 0.9 % 100 mL IVPB (1 g Intravenous New Bag/Given 03/16/18 1832)  acetaminophen (TYLENOL) tablet 650 mg (650 mg Oral Given 03/16/18 1411)  sodium chloride 0.9 % bolus 1,000 mL (0 mLs Intravenous Stopped 03/16/18 1507)  potassium chloride SA (K-DUR,KLOR-CON) CR tablet 40 mEq (40 mEq Oral Given 03/16/18 1542)  potassium chloride 10 mEq in 100 mL IVPB (0 mEq Intravenous Stopped 03/16/18 1642)  iopamidol (ISOVUE-300) 61 % injection 100 mL (100 mLs Intravenous Contrast Given 03/16/18 1750)     Initial Impression / Assessment and Plan / ED Course  I have reviewed the triage vital signs and the nursing notes.  Pertinent labs & imaging results that were available during my care of the patient were reviewed by me and considered in my medical decision making (see chart for details).     BP 134/81 (BP Location: Right Arm)   Pulse 84   Temp (!) 101.8 F (38.8 C) (Oral)   Resp 15   SpO2 100%   Breastfeeding No    Final Clinical Impressions(s) / ED Diagnoses   Final diagnoses:  Generalized abdominal pain  Acute lower UTI  Hypokalemia    ED Discharge Orders         Ordered    cephALEXin (KEFLEX) 500 MG capsule  4 times daily     03/16/18 1842    ibuprofen (ADVIL,MOTRIN) 600 MG tablet  Every 6 hours PRN     03/16/18 1842    potassium chloride SA (K-DUR,KLOR-CON) 20 MEQ tablet  Daily     03/16/18 1842         2:16 PM Patient complaining of fever chills body aches as  well as having abdominal pain and diarrhea.  Recent sick contact  with stomach flu.  She does not have any URI symptoms.  No chest pain or shortness of breath.  She was found to be febrile with a temperature of 101.8 and tachycardic with heart rate of 119.  Work-up initiated.  6:40 PM Pregnancy test is negative, influenza test is negative, patient has an elevated white count of 18.2 and she also has evidence of hypokalemia with a potassium of 2.8.  Lipase is negative, UA shows many bacteria along with 21-50 WBC suggestive of UTI.  Abdominal pelvis CT scan without acute finding.  Patient would also benefit from a pelvic examination however at this time patient request to be discharged.  Will treat with Keflex for suspected UTI, potassium supplementation to go home however proper return precaution discussed.  Urged patient to follow-up with PCP for further care.   Fayrene Helper, PA-C 03/16/18 1846    Sabas Sous, MD 03/17/18 1248

## 2018-05-05 ENCOUNTER — Encounter: Payer: Self-pay | Admitting: *Deleted

## 2018-05-24 DIAGNOSIS — Z5181 Encounter for therapeutic drug level monitoring: Secondary | ICD-10-CM | POA: Diagnosis not present

## 2018-05-31 DIAGNOSIS — Z5181 Encounter for therapeutic drug level monitoring: Secondary | ICD-10-CM | POA: Diagnosis not present

## 2018-06-14 DIAGNOSIS — Z5181 Encounter for therapeutic drug level monitoring: Secondary | ICD-10-CM | POA: Diagnosis not present

## 2018-06-21 DIAGNOSIS — Z5181 Encounter for therapeutic drug level monitoring: Secondary | ICD-10-CM | POA: Diagnosis not present

## 2018-06-28 DIAGNOSIS — Z5181 Encounter for therapeutic drug level monitoring: Secondary | ICD-10-CM | POA: Diagnosis not present

## 2018-07-05 DIAGNOSIS — Z5181 Encounter for therapeutic drug level monitoring: Secondary | ICD-10-CM | POA: Diagnosis not present

## 2018-07-11 DIAGNOSIS — Z5181 Encounter for therapeutic drug level monitoring: Secondary | ICD-10-CM | POA: Diagnosis not present

## 2018-07-21 DIAGNOSIS — Z5181 Encounter for therapeutic drug level monitoring: Secondary | ICD-10-CM | POA: Diagnosis not present

## 2018-07-25 DIAGNOSIS — Z5181 Encounter for therapeutic drug level monitoring: Secondary | ICD-10-CM | POA: Diagnosis not present

## 2018-08-01 DIAGNOSIS — Z5181 Encounter for therapeutic drug level monitoring: Secondary | ICD-10-CM | POA: Diagnosis not present

## 2018-08-08 DIAGNOSIS — Z5181 Encounter for therapeutic drug level monitoring: Secondary | ICD-10-CM | POA: Diagnosis not present

## 2018-08-17 DIAGNOSIS — Z5181 Encounter for therapeutic drug level monitoring: Secondary | ICD-10-CM | POA: Diagnosis not present

## 2018-08-22 DIAGNOSIS — Z5181 Encounter for therapeutic drug level monitoring: Secondary | ICD-10-CM | POA: Diagnosis not present

## 2018-08-29 DIAGNOSIS — Z5181 Encounter for therapeutic drug level monitoring: Secondary | ICD-10-CM | POA: Diagnosis not present

## 2018-09-05 DIAGNOSIS — Z5181 Encounter for therapeutic drug level monitoring: Secondary | ICD-10-CM | POA: Diagnosis not present

## 2018-09-14 DIAGNOSIS — Z5181 Encounter for therapeutic drug level monitoring: Secondary | ICD-10-CM | POA: Diagnosis not present

## 2018-09-19 DIAGNOSIS — Z5181 Encounter for therapeutic drug level monitoring: Secondary | ICD-10-CM | POA: Diagnosis not present

## 2018-09-27 DIAGNOSIS — Z5181 Encounter for therapeutic drug level monitoring: Secondary | ICD-10-CM | POA: Diagnosis not present

## 2018-10-06 DIAGNOSIS — Z5181 Encounter for therapeutic drug level monitoring: Secondary | ICD-10-CM | POA: Diagnosis not present

## 2018-10-13 DIAGNOSIS — Z5181 Encounter for therapeutic drug level monitoring: Secondary | ICD-10-CM | POA: Diagnosis not present

## 2018-10-25 DIAGNOSIS — Z5181 Encounter for therapeutic drug level monitoring: Secondary | ICD-10-CM | POA: Diagnosis not present

## 2018-10-31 DIAGNOSIS — Z5181 Encounter for therapeutic drug level monitoring: Secondary | ICD-10-CM | POA: Diagnosis not present

## 2018-11-10 DIAGNOSIS — Z5181 Encounter for therapeutic drug level monitoring: Secondary | ICD-10-CM | POA: Diagnosis not present

## 2018-11-15 DIAGNOSIS — Z5181 Encounter for therapeutic drug level monitoring: Secondary | ICD-10-CM | POA: Diagnosis not present

## 2018-11-21 DIAGNOSIS — Z5181 Encounter for therapeutic drug level monitoring: Secondary | ICD-10-CM | POA: Diagnosis not present

## 2018-11-28 DIAGNOSIS — Z5181 Encounter for therapeutic drug level monitoring: Secondary | ICD-10-CM | POA: Diagnosis not present

## 2018-12-07 DIAGNOSIS — Z5181 Encounter for therapeutic drug level monitoring: Secondary | ICD-10-CM | POA: Diagnosis not present

## 2018-12-14 DIAGNOSIS — Z5181 Encounter for therapeutic drug level monitoring: Secondary | ICD-10-CM | POA: Diagnosis not present

## 2018-12-21 DIAGNOSIS — Z5181 Encounter for therapeutic drug level monitoring: Secondary | ICD-10-CM | POA: Diagnosis not present

## 2018-12-27 DIAGNOSIS — Z5181 Encounter for therapeutic drug level monitoring: Secondary | ICD-10-CM | POA: Diagnosis not present

## 2019-01-02 DIAGNOSIS — Z5181 Encounter for therapeutic drug level monitoring: Secondary | ICD-10-CM | POA: Diagnosis not present

## 2019-01-04 ENCOUNTER — Telehealth: Payer: Self-pay | Admitting: Family Medicine

## 2019-01-04 NOTE — Telephone Encounter (Signed)
Patient requesting a call back to get information on what type of medication she was on when she was coming to our office.

## 2019-01-11 DIAGNOSIS — Z5181 Encounter for therapeutic drug level monitoring: Secondary | ICD-10-CM | POA: Diagnosis not present

## 2019-01-18 DIAGNOSIS — Z5181 Encounter for therapeutic drug level monitoring: Secondary | ICD-10-CM | POA: Diagnosis not present

## 2019-01-23 DIAGNOSIS — Z5181 Encounter for therapeutic drug level monitoring: Secondary | ICD-10-CM | POA: Diagnosis not present

## 2019-01-30 DIAGNOSIS — Z5181 Encounter for therapeutic drug level monitoring: Secondary | ICD-10-CM | POA: Diagnosis not present

## 2019-04-21 DIAGNOSIS — Z03818 Encounter for observation for suspected exposure to other biological agents ruled out: Secondary | ICD-10-CM | POA: Diagnosis not present

## 2019-05-05 DIAGNOSIS — Z03818 Encounter for observation for suspected exposure to other biological agents ruled out: Secondary | ICD-10-CM | POA: Diagnosis not present

## 2019-05-10 ENCOUNTER — Ambulatory Visit (INDEPENDENT_AMBULATORY_CARE_PROVIDER_SITE_OTHER): Payer: Medicaid Other | Admitting: *Deleted

## 2019-05-10 ENCOUNTER — Other Ambulatory Visit (HOSPITAL_COMMUNITY)
Admission: RE | Admit: 2019-05-10 | Discharge: 2019-05-10 | Disposition: A | Discharge status: Home / Self Care | Payer: Medicaid Other | Source: Ambulatory Visit | Attending: Family Medicine | Admitting: Family Medicine

## 2019-05-10 ENCOUNTER — Other Ambulatory Visit: Payer: Self-pay

## 2019-05-10 DIAGNOSIS — N898 Other specified noninflammatory disorders of vagina: Secondary | ICD-10-CM | POA: Diagnosis not present

## 2019-05-10 MED ORDER — FLUCONAZOLE 150 MG PO TABS: 150.0000 mg | ORAL_TABLET | Freq: Once | ORAL | 0 refills | Status: AC

## 2019-05-10 NOTE — Progress Notes (Signed)
Agree with A & P. 

## 2019-05-10 NOTE — Progress Notes (Signed)
Wants to do self swab . C/o bad vaginal odor, white discharge .  Denies irritation. States is not worried about STD and doesn't need std check. Would like to do self swab for bv and yeast. Requests med for yeast as she states it feels like and smells like when she had yeast. Diflucan RX sent to pharmacy per protocol.  Ekaterini Capitano,RN

## 2019-05-12 ENCOUNTER — Telehealth: Payer: Self-pay | Admitting: *Deleted

## 2019-05-12 DIAGNOSIS — B379 Candidiasis, unspecified: Secondary | ICD-10-CM

## 2019-05-12 DIAGNOSIS — B9689 Other specified bacterial agents as the cause of diseases classified elsewhere: Secondary | ICD-10-CM

## 2019-05-12 LAB — CERVICOVAGINAL ANCILLARY ONLY
Bacterial Vaginitis (gardnerella): POSITIVE — AB
Candida Glabrata: NEGATIVE
Candida Vaginitis: POSITIVE — AB
Comment: NEGATIVE
Comment: NEGATIVE
Comment: NEGATIVE

## 2019-05-12 MED ORDER — METRONIDAZOLE 500 MG PO TABS: 500.0000 mg | ORAL_TABLET | Freq: Two times a day (BID) | ORAL | 0 refills | Status: DC

## 2019-05-12 MED ORDER — FLUCONAZOLE 150 MG PO TABS: 150.0000 mg | ORAL_TABLET | Freq: Once | ORAL | 1 refills | Status: AC

## 2019-05-12 NOTE — Telephone Encounter (Signed)
-----   Message from Hermina Staggers, MD sent at 05/12/2019  9:21 AM EDT ----- Please send in Flagyl 500 mg po bid x 7 days and Diflucan 150 mg po now and repeat in 3 days. Pt is aware   Thanks Casimiro Needle

## 2019-05-12 NOTE — Telephone Encounter (Signed)
I called [facility] and heard a message voicemail not set up . Will send MyChart message . Shron Ozer,RN

## 2019-05-18 DIAGNOSIS — Z03818 Encounter for observation for suspected exposure to other biological agents ruled out: Secondary | ICD-10-CM | POA: Diagnosis not present

## 2019-05-24 ENCOUNTER — Ambulatory Visit: Payer: Medicaid Other | Admitting: Family Medicine

## 2019-06-01 DIAGNOSIS — Z03818 Encounter for observation for suspected exposure to other biological agents ruled out: Secondary | ICD-10-CM | POA: Diagnosis not present

## 2019-06-15 DIAGNOSIS — Z03818 Encounter for observation for suspected exposure to other biological agents ruled out: Secondary | ICD-10-CM | POA: Diagnosis not present

## 2019-06-30 DIAGNOSIS — Z03818 Encounter for observation for suspected exposure to other biological agents ruled out: Secondary | ICD-10-CM | POA: Diagnosis not present

## 2019-07-14 DIAGNOSIS — Z03818 Encounter for observation for suspected exposure to other biological agents ruled out: Secondary | ICD-10-CM | POA: Diagnosis not present

## 2019-07-28 DIAGNOSIS — Z03818 Encounter for observation for suspected exposure to other biological agents ruled out: Secondary | ICD-10-CM | POA: Diagnosis not present

## 2019-08-11 DIAGNOSIS — Z03818 Encounter for observation for suspected exposure to other biological agents ruled out: Secondary | ICD-10-CM | POA: Diagnosis not present

## 2020-02-29 ENCOUNTER — Other Ambulatory Visit: Payer: Self-pay

## 2020-02-29 ENCOUNTER — Encounter (HOSPITAL_COMMUNITY): Payer: Self-pay

## 2020-02-29 ENCOUNTER — Ambulatory Visit (HOSPITAL_COMMUNITY)
Admission: EM | Admit: 2020-02-29 | Discharge: 2020-02-29 | Disposition: A | Discharge status: Home / Self Care | Payer: Medicaid Other | Attending: Family Medicine | Admitting: Family Medicine

## 2020-02-29 DIAGNOSIS — R059 Cough, unspecified: Secondary | ICD-10-CM

## 2020-02-29 DIAGNOSIS — Z20822 Contact with and (suspected) exposure to covid-19: Secondary | ICD-10-CM | POA: Diagnosis not present

## 2020-02-29 DIAGNOSIS — R112 Nausea with vomiting, unspecified: Secondary | ICD-10-CM | POA: Diagnosis not present

## 2020-02-29 DIAGNOSIS — R519 Headache, unspecified: Secondary | ICD-10-CM | POA: Diagnosis not present

## 2020-02-29 LAB — POC INFLUENZA A AND B ANTIGEN (URGENT CARE ONLY)
Influenza A Ag: NEGATIVE
Influenza B Ag: NEGATIVE

## 2020-02-29 LAB — SARS CORONAVIRUS 2 (TAT 6-24 HRS): SARS Coronavirus 2: NEGATIVE

## 2020-02-29 MED ORDER — ONDANSETRON 4 MG PO TBDP: 4.0000 mg | ORAL_TABLET | Freq: Three times a day (TID) | ORAL | 0 refills | Status: AC | PRN

## 2020-02-29 MED ORDER — NAPROXEN 500 MG PO TABS: 500.0000 mg | ORAL_TABLET | Freq: Two times a day (BID) | ORAL | 0 refills | Status: DC | PRN

## 2020-02-29 NOTE — ED Triage Notes (Signed)
Pt presents with ongoing headache, nausea, and vomiting X 2 days.  Pt has Hx of hypertension.

## 2020-02-29 NOTE — ED Provider Notes (Signed)
[facility]    CSN: [id number] Arrival date & time: 02/29/20  1203      History   Chief Complaint Chief Complaint  Patient presents with  . Headache  . Emesis    HPI Monique Gamble is a 30 y.o. female.   Here with 1-2 days of severe frontal headache, N/V, cough, fatigue. Denies fever, chills, CP, SOB, congestion, sore throat. Trying OTC pain relievers without benefit, asking for naproxen as this seems to help better than other medicines for her headache. No known sick contacts, new medications, new foods, hx of GI issues. Did not get flu vaccine, has had 1 pfizer COVID vaccine.       Past Medical History:  Diagnosis Date  . ADHD (attention deficit hyperactivity disorder)   . Chlamydia   . Gestational diabetes   . Headache   . History of anemia   . Hypertension   . Pregnancy induced hypertension     Patient Active Problem List   Diagnosis Date Noted  . Essential hypertension-postpartum 02/18/2018  . Indication for care in labor or delivery 02/01/2018  . Grand multipara 01/20/2018  . GDM (gestational diabetes mellitus) 12/24/2017  . Chronic hypertension during pregnancy, antepartum 12/03/2017  . Anemia in pregnancy 11/04/2017  . No prenatal care in current pregnancy 08/22/2016  . H/O pre-eclampsia in prior pregnancy, currently pregnant 01/30/2015  . Supervision of high-risk pregnancy 12/19/2014  . Rubella non-immune status, antepartum 02/13/2014    Past Surgical History:  Procedure Laterality Date  . INDUCED ABORTION    . TUBAL LIGATION Bilateral 02/01/2018   Procedure: POST PARTUM TUBAL LIGATION;  Surgeon: Vinco Bing, MD;  Location: [facility] BIRTHING SUITES;  Service: Gynecology;  Laterality: Bilateral;    OB History    Gravida  8   Para  6   Term  6   Preterm  0   AB  2   Living  6     SAB  1   IAB  1   Ectopic  0   Multiple  0   Live Births  6            Home Medications    Prior to Admission medications    Medication Sig Start Date End Date Taking? Authorizing Provider  naproxen (NAPROSYN) 500 MG tablet Take 1 tablet (500 mg total) by mouth 2 (two) times daily as needed. 02/29/20  Yes Particia Nearing, PA-C  ondansetron (ZOFRAN ODT) 4 MG disintegrating tablet Take 1 tablet (4 mg total) by mouth every 8 (eight) hours as needed for nausea or vomiting. 02/29/20  Yes Particia Nearing, PA-C  amLODipine (NORVASC) 10 MG tablet Take 1 tablet (10 mg total) by mouth daily. Patient not taking: Reported on 05/10/2019 02/03/18   Gwenevere Abbot, MD  butalbital-acetaminophen-caffeine (FIORICET, ESGIC) [phone] MG tablet Take 1-2 tablets by mouth every 6 (six) hours as needed for headache. Patient not taking: Reported on 05/10/2019 02/24/18   Gerrit Heck, CNM  cephALEXin (KEFLEX) 500 MG capsule Take 1 capsule (500 mg total) by mouth 4 (four) times daily. 03/16/18   Fayrene Helper, PA-C  cyclobenzaprine (FLEXERIL) 10 MG tablet Take 10 mg by mouth 3 (three) times daily. 02/04/18   [provider]  ibuprofen (ADVIL,MOTRIN) 600 MG tablet Take 1 tablet (600 mg total) by mouth every 6 (six) hours as needed for fever or moderate pain. 03/16/18   Fayrene Helper, PA-C  lisinopril (PRINIVIL,ZESTRIL) 20 MG tablet Take 1 tablet (20 mg total) by mouth daily.  Patient not taking: Reported on 05/10/2019 02/18/18   Raelyn Mora, CNM  metroNIDAZOLE (FLAGYL) 500 MG tablet Take 1 tablet (500 mg total) by mouth 2 (two) times daily. 05/12/19   Hermina Staggers, MD  potassium chloride SA (K-DUR,KLOR-CON) 20 MEQ tablet Take 1 tablet (20 mEq total) by mouth daily. 03/16/18   Fayrene Helper, PA-C  Prenatal Vit-Fe Fumarate-FA (PRENATAL MULTIVITAMIN) TABS tablet Take 1 tablet by mouth daily at 12 noon. Patient not taking: Reported on 03/16/2018 02/03/18   Gwenevere Abbot, MD  witch hazel-glycerin (TUCKS) pad Apply 1 application topically as needed for hemorrhoids. Patient not taking: Reported on 03/16/2018 02/03/18   Gwenevere Abbot, MD     Family History Family History  Problem Relation Age of Onset  . Hypertension Mother   . Hypertension Father   . Diabetes Sister   . Anesthesia problems Neg Hx     Social History Social History   Tobacco Use  . Smoking status: Never Smoker  . Smokeless tobacco: Never Used  Vaping Use  . Vaping Use: Never used  Substance Use Topics  . Alcohol use: No  . Drug use: Yes    Types: Marijuana    Comment: last use 03 Jul 2017     Allergies   Patient has no known allergies.   Review of Systems Review of Systems PER HPI    Physical Exam Triage Vital Signs ED Triage Vitals  Enc Vitals Group     BP 02/29/20 1231 131/81     Pulse Rate 02/29/20 1231 83     Resp 02/29/20 1231 18     Temp 02/29/20 1231 98.2 F (36.8 C)     Temp Source 02/29/20 1231 Oral     SpO2 02/29/20 1231 100 %     Weight --      Height --      Head Circumference --      Peak Flow --      Pain Score 02/29/20 1232 7     Pain Loc --      Pain Edu? --      Excl. in GC? --    No data found.  Updated Vital Signs BP 131/81 (BP Location: Right Arm)   Pulse 83   Temp 98.2 F (36.8 C) (Oral)   Resp 18   LMP 02/16/2020   SpO2 100%   Visual Acuity Right Eye Distance:   Left Eye Distance:   Bilateral Distance:    Right Eye Near:   Left Eye Near:    Bilateral Near:     Physical Exam Vitals and nursing note reviewed.  Constitutional:      Appearance: Normal appearance. She is not ill-appearing.  HENT:     Head: Atraumatic.     Right Ear: Tympanic membrane normal.     Left Ear: Tympanic membrane normal.     Nose: Nose normal.     Mouth/Throat:     Mouth: Mucous membranes are moist.     Pharynx: Oropharynx is clear.  Eyes:     Extraocular Movements: Extraocular movements intact.     Conjunctiva/sclera: Conjunctivae normal.  Cardiovascular:     Rate and Rhythm: Normal rate and regular rhythm.     Heart sounds: Normal heart sounds.  Pulmonary:     Effort: Pulmonary effort is  normal.     Breath sounds: Normal breath sounds.  Abdominal:     General: Bowel sounds are normal. There is no distension.     Palpations: Abdomen is  soft.     Tenderness: There is no abdominal tenderness. There is no right CVA tenderness, left CVA tenderness or guarding.  Musculoskeletal:        General: Normal range of motion.     Cervical back: Normal range of motion and neck supple.  Skin:    General: Skin is warm and dry.  Neurological:     General: No focal deficit present.     Mental Status: She is alert and oriented to person, place, and time. Mental status is at baseline.     Cranial Nerves: No cranial nerve deficit.     Motor: No weakness.     Gait: Gait normal.  Psychiatric:        Mood and Affect: Mood normal.        Thought Content: Thought content normal.        Judgment: Judgment normal.      UC Treatments / Results  Labs (all labs ordered are listed, but only abnormal results are displayed) Labs Reviewed  SARS CORONAVIRUS 2 (TAT 6-24 HRS)    EKG   Radiology No results found.  Procedures Procedures (including critical care time)  Medications Ordered in UC Medications - No data to display  Initial Impression / Assessment and Plan / UC Course  I have reviewed the triage vital signs and the nursing notes.  Pertinent labs & imaging results that were available during my care of the patient were reviewed by me and considered in my medical decision making (see chart for details).     Exam and vitals very reassuring today. COVID pcr pending, work note given and isolation reviewed. Naproxen and zofran given, discussed supportive care and OTC remedies otherwise.   Final Clinical Impressions(s) / UC Diagnoses   Final diagnoses:  Intractable vomiting with nausea, unspecified vomiting type  Acute nonintractable headache, unspecified headache type  Cough   Discharge Instructions   None    ED Prescriptions    Medication Sig Dispense Auth. Provider    naproxen (NAPROSYN) 500 MG tablet Take 1 tablet (500 mg total) by mouth 2 (two) times daily as needed. 30 tablet Particia Nearing, PA-C   ondansetron (ZOFRAN ODT) 4 MG disintegrating tablet Take 1 tablet (4 mg total) by mouth every 8 (eight) hours as needed for nausea or vomiting. 20 tablet Particia Nearing, [place]     PDMP not reviewed this encounter.   Particia Nearing, [place] 02/29/20 1306

## 2020-04-09 DIAGNOSIS — Z1152 Encounter for screening for COVID-19: Secondary | ICD-10-CM | POA: Diagnosis not present

## 2020-04-14 ENCOUNTER — Emergency Department (HOSPITAL_COMMUNITY)
Admission: EM | Admit: 2020-04-14 | Discharge: 2020-04-14 | Disposition: A | Discharge status: Home / Self Care | Payer: Medicaid Other | Attending: Emergency Medicine | Admitting: Emergency Medicine

## 2020-04-14 ENCOUNTER — Other Ambulatory Visit: Payer: Self-pay

## 2020-04-14 ENCOUNTER — Encounter (HOSPITAL_COMMUNITY): Payer: Self-pay

## 2020-04-14 DIAGNOSIS — H9201 Otalgia, right ear: Secondary | ICD-10-CM | POA: Insufficient documentation

## 2020-04-14 DIAGNOSIS — I10 Essential (primary) hypertension: Secondary | ICD-10-CM | POA: Insufficient documentation

## 2020-04-14 DIAGNOSIS — Z79899 Other long term (current) drug therapy: Secondary | ICD-10-CM | POA: Diagnosis not present

## 2020-04-14 DIAGNOSIS — K0889 Other specified disorders of teeth and supporting structures: Secondary | ICD-10-CM | POA: Insufficient documentation

## 2020-04-14 MED ORDER — IBUPROFEN 600 MG PO TABS: 600.0000 mg | ORAL_TABLET | Freq: Four times a day (QID) | ORAL | 0 refills | Status: DC | PRN

## 2020-04-14 MED ORDER — AMOXICILLIN-POT CLAVULANATE 875-125 MG PO TABS: 1.0000 | ORAL_TABLET | Freq: Two times a day (BID) | ORAL | 0 refills | Status: DC

## 2020-04-14 MED ORDER — AMOXICILLIN-POT CLAVULANATE 875-125 MG PO TABS
1.0000 | ORAL_TABLET | Freq: Once | ORAL | Status: AC
  Administered 2020-04-14: 1 via ORAL
  Filled 2020-04-14: qty 1

## 2020-04-14 MED ORDER — IBUPROFEN 400 MG PO TABS
600.0000 mg | ORAL_TABLET | Freq: Once | ORAL | Status: AC
  Administered 2020-04-14: 600 mg via ORAL
  Filled 2020-04-14: qty 1

## 2020-04-14 NOTE — ED Triage Notes (Signed)
Pt reports right sided ear and lower dental pain, denies fever/chills at home. Pain started last night. Pt unsure if it is her tooth or her ear, does not have a dentist. Pt a.o, airway intact

## 2020-04-14 NOTE — Discharge Instructions (Addendum)
Suspect your symptoms are more so from dental infection, but antibiotic would also treat for any developing ear infection. Please take entire course of antibiotics as directed.  Continue using ibuprofen and Tylenol for pain.  You will need to follow-up with your dentist for continued management of this. Use dental resources provided.  Return to the emergency department for fevers, swelling or pain under the tongue or in the neck, difficulty breathing or swallowing or any other new or concerning symptoms.

## 2020-04-14 NOTE — ED Provider Notes (Signed)
[facility] EMERGENCY DEPARTMENT Provider Note   CSN: [id number] Arrival date & time: 04/14/20  1036     History Chief Complaint  Patient presents with  . Ear Pain  . Dental Pain    Monique Gamble is a 30 y.o. female.  Monique Gamble is a 30 y.o. female with a history of hypertension, headaches, ADHD, who presents to the emergency department for evaluation of right-sided ear pain and right lower dental pain.  Symptoms began last night and have been severe and persistent.  Pain kept her from sleeping.  She is unsure if the pain is coming from her tooth or her ear.  Denies any pain in the left ear.  Has not noticed any drainage or swelling in the mouth but does report area where 2 teeth are missing.  Does not have a dentist that she follows with.  Had some chills last night but no fevers.  No rhinorrhea or nasal congestion, no sore throat or cough, no difficulty swallowing, no nausea or vomiting.  No headaches.  Has not tried any medications for pain prior to arrival.  No other aggravating or alleviating factors.        Past Medical History:  Diagnosis Date  . ADHD (attention deficit hyperactivity disorder)   . Chlamydia   . Gestational diabetes   . Headache   . History of anemia   . Hypertension   . Pregnancy induced hypertension     Patient Active Problem List   Diagnosis Date Noted  . Essential hypertension-postpartum 02/18/2018  . Indication for care in labor or delivery 02/01/2018  . Grand multipara 01/20/2018  . GDM (gestational diabetes mellitus) 12/24/2017  . Chronic hypertension during pregnancy, antepartum 12/03/2017  . Anemia in pregnancy 11/04/2017  . No prenatal care in current pregnancy 08/22/2016  . H/O pre-eclampsia in prior pregnancy, currently pregnant 01/30/2015  . Supervision of high-risk pregnancy 12/19/2014  . Rubella non-immune status, antepartum 02/13/2014    Past Surgical History:  Procedure Laterality Date  . INDUCED  ABORTION    . TUBAL LIGATION Bilateral 02/01/2018   Procedure: POST PARTUM TUBAL LIGATION;  Surgeon: Jasper Bing, MD;  Location: [facility] BIRTHING SUITES;  Service: Gynecology;  Laterality: Bilateral;     OB History    Gravida  8   Para  6   Term  6   Preterm  0   AB  2   Living  6     SAB  1   IAB  1   Ectopic  0   Multiple  0   Live Births  6           Family History  Problem Relation Age of Onset  . Hypertension Mother   . Hypertension Father   . Diabetes Sister   . Anesthesia problems Neg Hx     Social History   Tobacco Use  . Smoking status: Never Smoker  . Smokeless tobacco: Never Used  Vaping Use  . Vaping Use: Never used  Substance Use Topics  . Alcohol use: No  . Drug use: Yes    Types: Marijuana    Comment: last use 03 Jul 2017    Home Medications Prior to Admission medications   Medication Sig Start Date End Date Taking? Authorizing Provider  amoxicillin-clavulanate (AUGMENTIN) 875-125 MG tablet Take 1 tablet by mouth 2 (two) times daily. One po bid x 7 days 04/14/20  Yes Dartha Lodge, PA-C  ibuprofen (ADVIL) 600 MG tablet Take  1 tablet (600 mg total) by mouth every 6 (six) hours as needed. 04/14/20  Yes Dartha Lodge, PA-C  amLODipine (NORVASC) 10 MG tablet Take 1 tablet (10 mg total) by mouth daily. Patient not taking: Reported on 05/10/2019 02/03/18   Gwenevere Abbot, MD  butalbital-acetaminophen-caffeine (FIORICET, ESGIC) [phone] MG tablet Take 1-2 tablets by mouth every 6 (six) hours as needed for headache. Patient not taking: Reported on 05/10/2019 02/24/18   Gerrit Heck, CNM  cephALEXin (KEFLEX) 500 MG capsule Take 1 capsule (500 mg total) by mouth 4 (four) times daily. 03/16/18   Fayrene Helper, PA-C  cyclobenzaprine (FLEXERIL) 10 MG tablet Take 10 mg by mouth 3 (three) times daily. 02/04/18   [provider]  lisinopril (PRINIVIL,ZESTRIL) 20 MG tablet Take 1 tablet (20 mg total) by mouth daily. Patient not taking: Reported  on 05/10/2019 02/18/18   Raelyn Mora, CNM  metroNIDAZOLE (FLAGYL) 500 MG tablet Take 1 tablet (500 mg total) by mouth 2 (two) times daily. 05/12/19   Hermina Staggers, MD  naproxen (NAPROSYN) 500 MG tablet Take 1 tablet (500 mg total) by mouth 2 (two) times daily as needed. 02/29/20   Particia Nearing, PA-C  ondansetron (ZOFRAN ODT) 4 MG disintegrating tablet Take 1 tablet (4 mg total) by mouth every 8 (eight) hours as needed for nausea or vomiting. 02/29/20   Particia Nearing, PA-C  potassium chloride SA (K-DUR,KLOR-CON) 20 MEQ tablet Take 1 tablet (20 mEq total) by mouth daily. 03/16/18   Fayrene Helper, PA-C  Prenatal Vit-Fe Fumarate-FA (PRENATAL MULTIVITAMIN) TABS tablet Take 1 tablet by mouth daily at 12 noon. Patient not taking: Reported on 03/16/2018 02/03/18   Gwenevere Abbot, MD  witch hazel-glycerin (TUCKS) pad Apply 1 application topically as needed for hemorrhoids. Patient not taking: Reported on 03/16/2018 02/03/18   Gwenevere Abbot, MD    Allergies    Patient has no known allergies.  Review of Systems   Review of Systems  Constitutional: Positive for chills. Negative for fever.  HENT: Positive for dental problem and sore throat. Negative for congestion, facial swelling and rhinorrhea.   Respiratory: Negative for cough.   Cardiovascular: Negative for chest pain.  Gastrointestinal: Negative for abdominal pain and nausea.  Skin: Negative for color change and rash.  Neurological: Negative for headaches.    Physical Exam Updated Vital Signs BP (!) 140/91 (BP Location: Right Arm)   Pulse 75   Temp 98.2 F (36.8 C) (Oral)   Resp 20   Ht 5\' 6"  (1.676 m)   Wt 77.6 kg   SpO2 99%   BMI 27.61 kg/m   Physical Exam Vitals and nursing note reviewed.  Constitutional:      General: She is not in acute distress.    Appearance: Normal appearance. She is well-developed, normal weight and well-nourished. She is not ill-appearing or diaphoretic.  HENT:     Head: Normocephalic  and atraumatic.     Ears:     Comments: Small amount of wax present in the right ear canal, TM appears clear without erythema or bulging, left TM clear as well with good right reflection    Nose: Nose normal. No congestion or rhinorrhea.     Mouth/Throat:     Comments: Along the right lower jaw there are 2 missing molars with some erythema of the surrounding gums, no palpable fluctuance or clear abscess, no sublingual tenderness or swelling, posterior oropharynx is clear, normal phonation, tolerating secretions. Eyes:     General:  Right eye: No discharge.        Left eye: No discharge.  Neck:     Comments: No stridor, no torticollis Cardiovascular:     Rate and Rhythm: Normal rate.  Pulmonary:     Effort: Pulmonary effort is normal. No respiratory distress.  Musculoskeletal:     Cervical back: Neck supple. No rigidity.  Skin:    General: Skin is warm and dry.  Neurological:     Mental Status: She is alert and oriented to person, place, and time.     Coordination: Coordination normal.  Psychiatric:        Mood and Affect: Mood and affect and mood normal.        Behavior: Behavior normal.     ED Results / Procedures / Treatments   Labs (all labs ordered are listed, but only abnormal results are displayed) Labs Reviewed - No data to display  EKG None  Radiology No results found.  Procedures Procedures   Medications Ordered in ED Medications  ibuprofen (ADVIL) tablet 600 mg (600 mg Oral Given 04/14/20 1124)  amoxicillin-clavulanate (AUGMENTIN) 875-125 MG per tablet 1 tablet (1 tablet Oral Given 04/14/20 1124)    ED Course  I have reviewed the triage vital signs and the nursing notes.  Pertinent labs & imaging results that were available during my care of the patient were reviewed by me and considered in my medical decision making (see chart for details).    MDM Rules/Calculators/A&P                         30 year old female presents with right ear pain as  well as some right dental pain.  On arrival she is mildly hypertensive but vitals otherwise normal and patient is well-appearing.  TMs bilaterally are clear, suspect ear pain may be coming from right lower dental pain with some slight lymphadenopathy noted.  2 missing molars with some erythema of the gums, no obvious drainable abscess and no concern for Ludwig's angina.  Will treat with Augmentin which would treat for dental infection as well as otitis media.  Motrin and Tylenol as needed for pain.  Dental resources provided.  Discussed follow-up and return precautions.  Patient expresses understanding and agreement.  Discharged home in good condition.  Final Clinical Impression(s) / ED Diagnoses Final diagnoses:  Pain, dental  Otalgia of right ear    Rx / DC Orders ED Discharge Orders         Ordered    amoxicillin-clavulanate (AUGMENTIN) 875-125 MG tablet  2 times daily        04/14/20 1118    ibuprofen (ADVIL) 600 MG tablet  Every 6 hours PRN        04/14/20 [address] [place], [place] 04/14/20 1153    Arby Barrette, MD 04/14/20 [phone]

## 2020-04-15 ENCOUNTER — Telehealth: Payer: Self-pay

## 2020-04-15 DIAGNOSIS — Z1152 Encounter for screening for COVID-19: Secondary | ICD-10-CM | POA: Diagnosis not present

## 2020-04-15 NOTE — Telephone Encounter (Signed)
Transition Care Management Unsuccessful Follow-up Telephone Call  Date of discharge and from where:  04/14/2020 from [facility]  Attempts:  1st Attempt  Reason for unsuccessful TCM follow-up call:  Left voice message

## 2020-04-16 NOTE — Telephone Encounter (Signed)
Transition Care Management Unsuccessful Follow-up Telephone Call  Date of discharge and from where:  04/14/2020 from [facility]  Attempts:  2nd Attempt  Reason for unsuccessful TCM follow-up call:  Left voice message

## 2020-04-17 NOTE — Telephone Encounter (Signed)
Transition Care Management Unsuccessful Follow-up Telephone Call  Date of discharge and from where:  04/14/2020 from [facility]  Attempts:  3rd Attempt  Reason for unsuccessful TCM follow-up call:  Left voice message

## 2020-04-17 NOTE — Telephone Encounter (Signed)
Transition Care Management Follow-up Telephone Call  Date of discharge and from where: 04/14/2020 from [facility]  How have you been since you were released from the hospital? Pt states that she is feeling a lot better. Pt had a question about her son who was also seen at the same time. Pt informed to call peds office for recommendations.   Any questions or concerns? No  Items Reviewed:  Did the pt receive and understand the discharge instructions provided? Yes   Medications obtained and verified? Yes   Other? No   Any new allergies since your discharge? No   Dietary orders reviewed? Heart Healthy  Do you have support at home? Yes    Functional Questionnaire: (I = Independent and D = Dependent) ADLs: I  Bathing/Dressing- I  Meal Prep- I  Eating- I  Maintaining continence- I  Transferring/Ambulation- I  Managing Meds- I   Follow up appointments reviewed:   PCP Hospital f/u appt confirmed? No    Specialist Hospital f/u appt confirmed? No    Are transportation arrangements needed? No   If their condition worsens, is the pt aware to call PCP or go to the Emergency Dept.? Yes  Was the patient provided with contact information for the PCP's office or ED? Yes  Was to pt encouraged to call back with questions or concerns? Yes

## 2020-04-22 DIAGNOSIS — Z1152 Encounter for screening for COVID-19: Secondary | ICD-10-CM | POA: Diagnosis not present

## 2020-05-02 DIAGNOSIS — Z1152 Encounter for screening for COVID-19: Secondary | ICD-10-CM | POA: Diagnosis not present

## 2020-05-07 IMAGING — US US MFM OB DETAIL+14 WK
1 series · 13 of 28 positions shown · non-contrast
Comparison: none

[Series 1: us mfm ob detail+14 wk · 96 acquisitions, 13 frames shown]
[im 4/96]
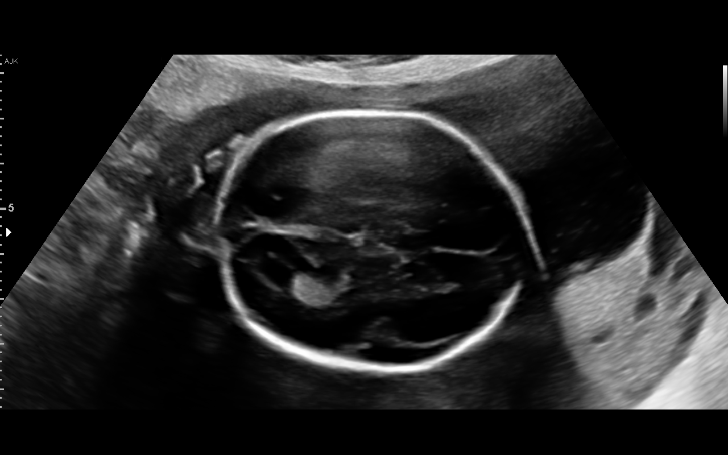
[im 11/96]
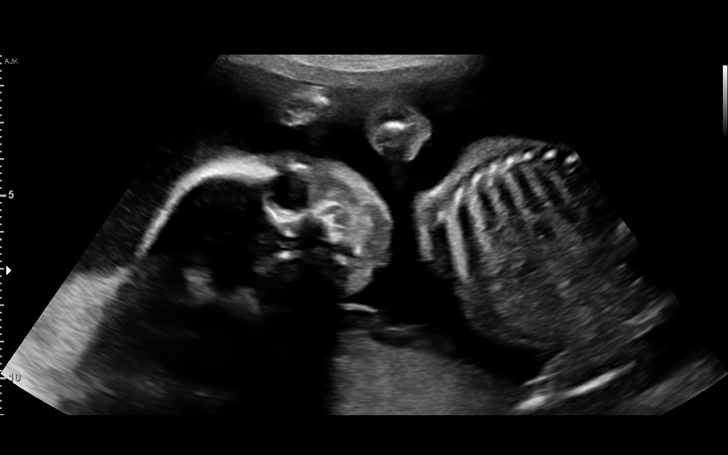
[im 18/96]
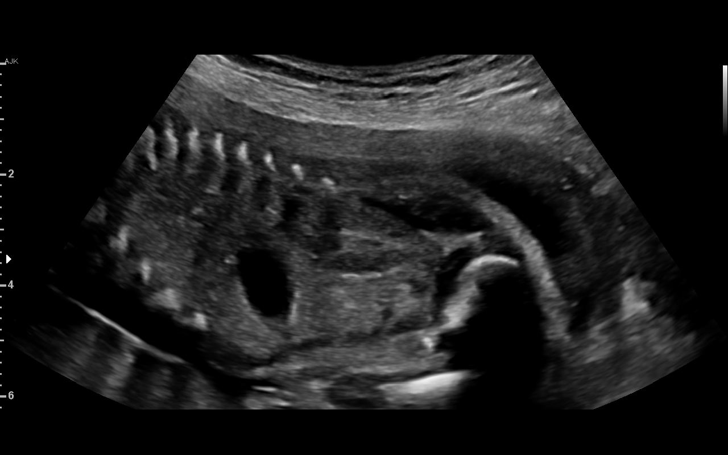
[im 25/96]
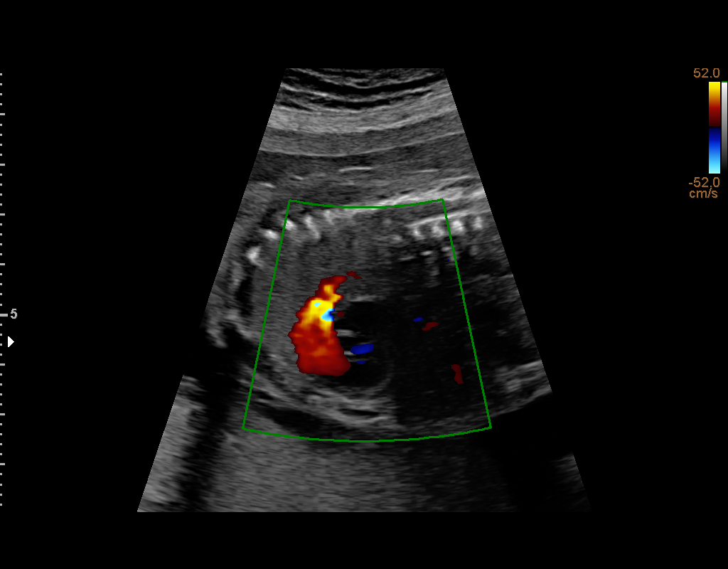
[im 32/96]
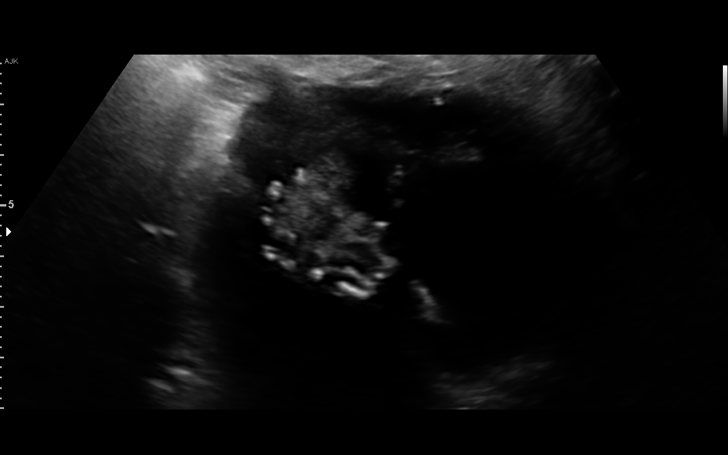
[im 39/96]
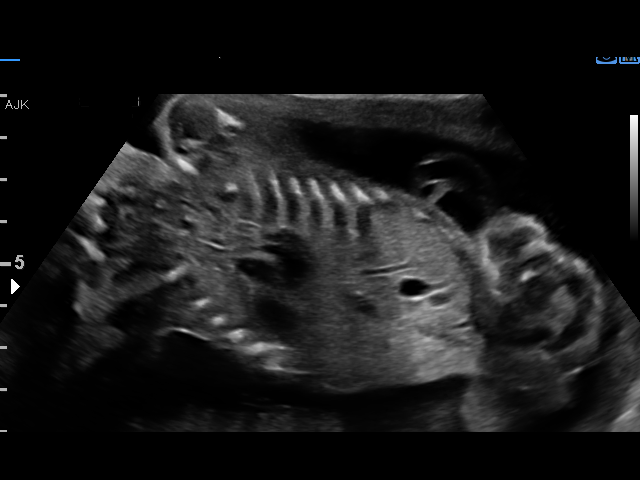
[im 50/96]
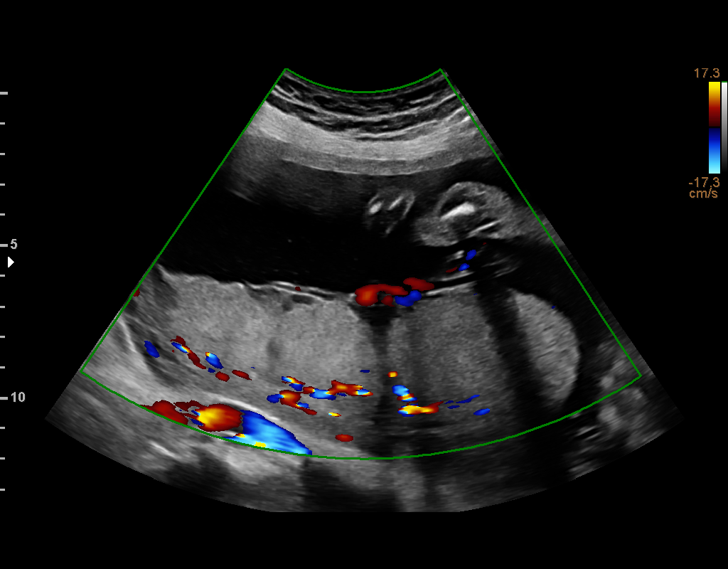
[im 57/96]
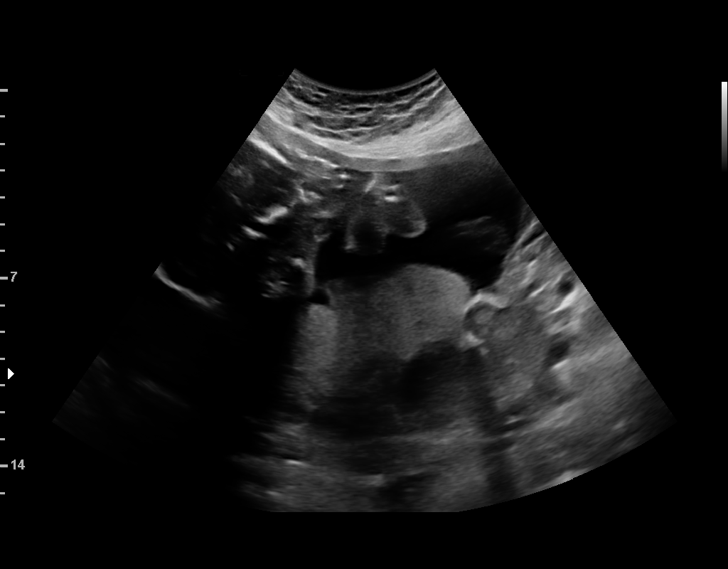
[im 64/96]
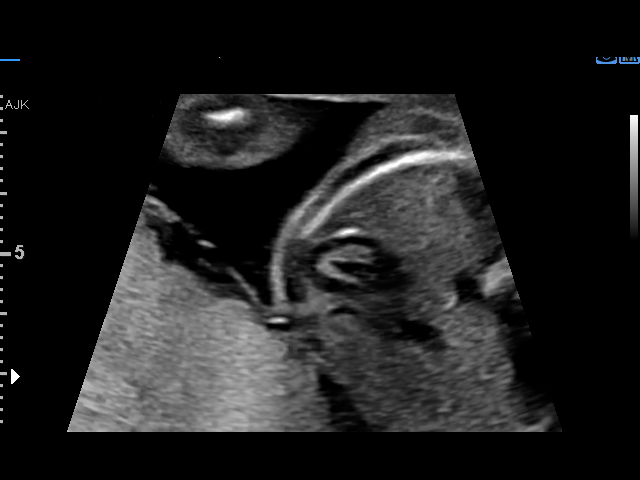
[im 71/96]
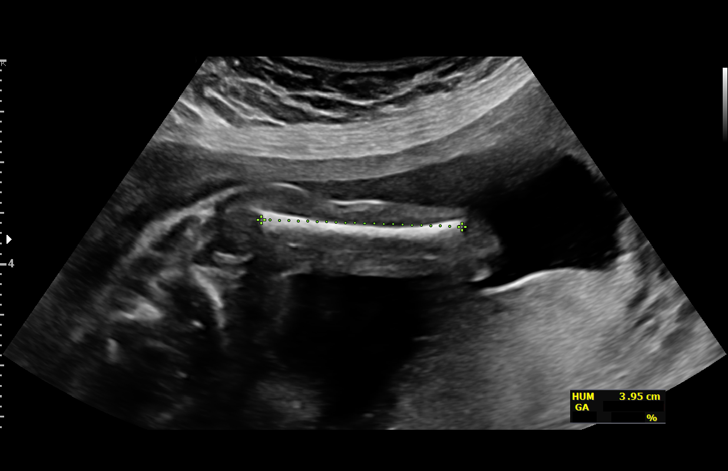
[im 78/96]
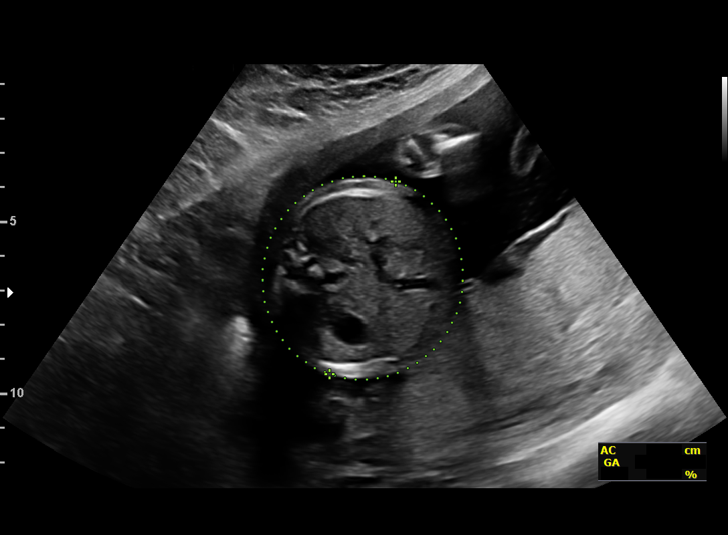
[im 85/96]
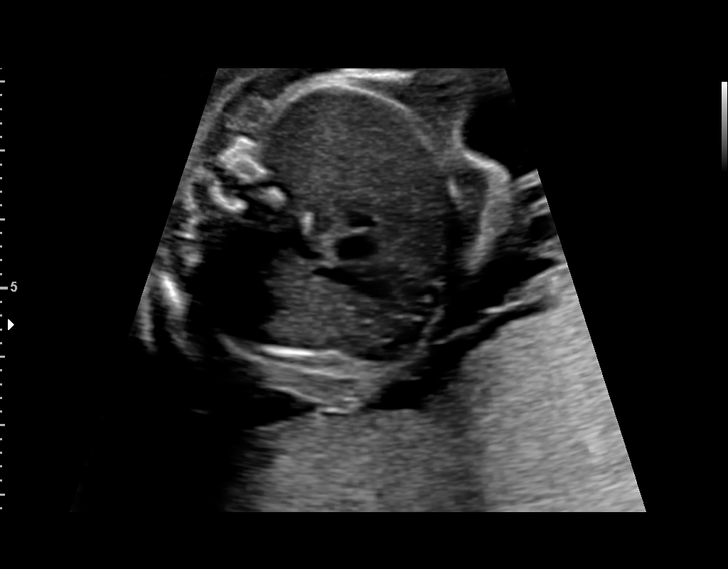
[im 92/96]
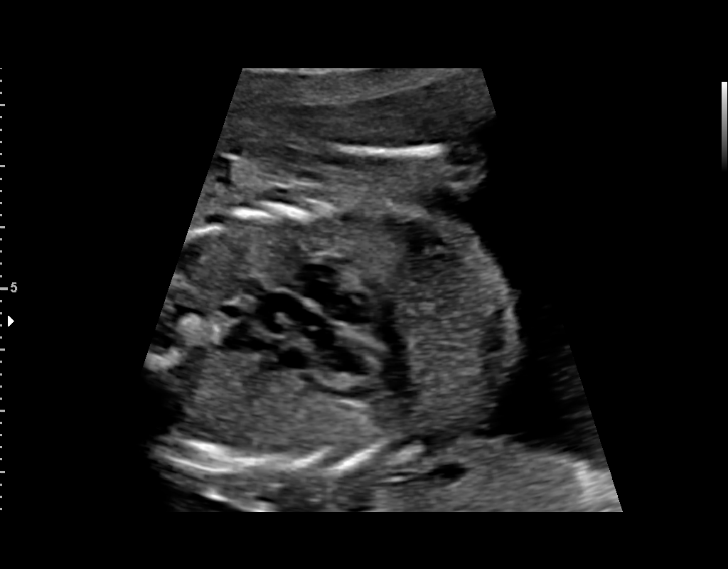

[13 of 28 positions shown; findings below may reference images not displayed]

Indications

Late prenatal care, second trimester
Poor obstetric history: Previous
preeclampsia / eclampsia/gestational HTN
Hypertension - Chronic/Pre-existing
(labetalol)
Encounter for antenatal screening for
malformations
24 weeks gestation of pregnancy
Vital Signs

BMI:
Fetal Evaluation

Num Of Fetuses:         1
Fetal Heart Rate(bpm):  144
Cardiac Activity:       Observed
Presentation:           Breech
Placenta:               Posterior
P. Cord Insertion:      Visualized, central

Amniotic Fluid
AFI FV:      Within normal limits

Largest Pocket(cm)
4.72
Biometry
BPD:      57.3  mm     G. Age:  23w 4d         27  %    CI:        76.21   %    70 - 86
FL/HC:      19.9   %    18.7 -
HC:       208   mm     G. Age:  22w 6d          6  %    HC/AC:      1.13        1.05 -
AC:      184.4  mm     G. Age:  23w 2d         20  %    FL/BPD:     72.3   %    71 - 87
FL:       41.4  mm     G. Age:  23w 3d         22  %    FL/AC:      22.5   %    20 - 24
HUM:      39.9  mm     G. Age:  24w 2d         49  %
CER:      26.3  mm     G. Age:  24w 1d         53  %

LV:        4.8  mm

Est. FW:     582  gm      1 lb 5 oz     37  %
OB History

Gravidity:    8         Term:   5        Prem:   0        SAB:   1
TOP:          1       Ectopic:  0        Living: 5
Gestational Age

LMP:           24w 0d        Date:  05/17/17                 EDD:   02/21/18
U/S Today:     23w 2d                                        EDD:   02/26/18
Best:          24w 0d     Det. By:  LMP  (05/17/17)          EDD:   02/21/18
Anatomy

Cranium:               Appears normal         Aortic Arch:            Appears normal
Cavum:                 Appears normal         Ductal Arch:            Appears normal
Ventricles:            Appears normal         Diaphragm:              Appears normal
Choroid Plexus:        Appears normal         Stomach:                Appears normal, left
sided
Cerebellum:            Appears normal         Abdomen:                Appears normal
Posterior Fossa:       Appears normal         Abdominal Wall:         Appears nml (cord
insert, abd wall)
Nuchal Fold:           Not applicable (>20    Cord Vessels:           Appears normal (3
wks GA)                                        vessel cord)
Face:                  Appears normal         Kidneys:                Appear normal
(orbits and profile)
Lips:                  Appears normal         Bladder:                Appears normal
Thoracic:              Appears normal         Spine:                  Appears normal
Heart:                 Appears normal         Upper Extremities:      Appears normal
(4CH, axis, and
situs)
RVOT:                  Appears normal         Lower Extremities:      Appears normal
LVOT:                  Appears normal

Other:  Fetus appears to be a male.Open hands and 5th digit seen.
Technically difficult due to fetal position.
Cervix Uterus Adnexa

Cervix
Length:           4.38  cm.
Normal appearance by transabdominal scan.

Uterus
No abnormality visualized.

Left Ovary
Within normal limits.
Right Ovary
Within normal limits.

Adnexa
No abnormality visualized.
Impression

We performed fetal anatomy scan. No makers of
aneuploidies or fetal structural defects are seen. Fetal
biometry is consistent with her previously-established dates.
Amniotic fluid is normal and good fetal activity is seen.
Chronic hypertension. Well-controlled on labetalol.

Patient reports she had "blood work" 3 days ago to screen for
aneuploidies. ?Results pending.
Recommendations

An appointment was made for her to return in 4 weeks for
fetal growth assessment.

## 2020-09-19 IMAGING — CT CT ABD-PELV W/ CM
2 of 4 series · 16 of 46 positions shown, 18 images · IV contrast (ISOVUE)
Comparison: None.

CLINICAL DATA: Abdominal pain with fever

EXAM:
CT ABDOMEN AND PELVIS WITH CONTRAST
TECHNIQUE: Multidetector CT imaging of the abdomen and pelvis was performed
using the standard protocol following bolus administration of
intravenous contrast.
CONTRAST:  100mL XMZI10-REE IOPAMIDOL (XMZI10-REE) INJECTION 61%

[Series 2: axial st · axial · 0.71mm/px · z∈[-511,-76]mm · 13 of 97 slices shown, 15 images]
[im 5/97  soft-tissue]
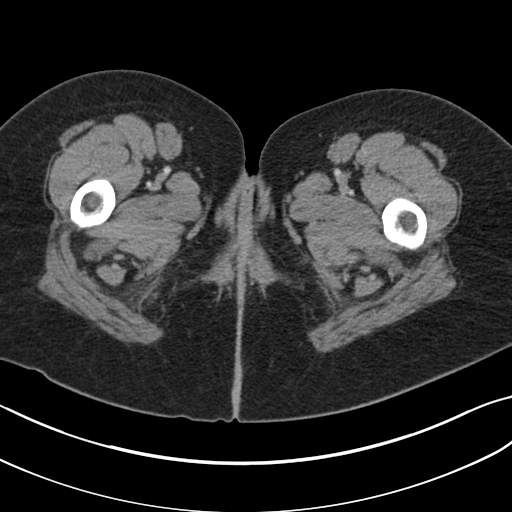
[im 5/97  bone]
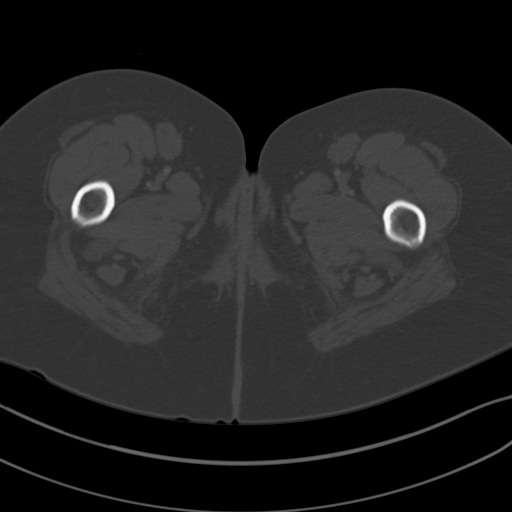
[im 15/97  soft-tissue]
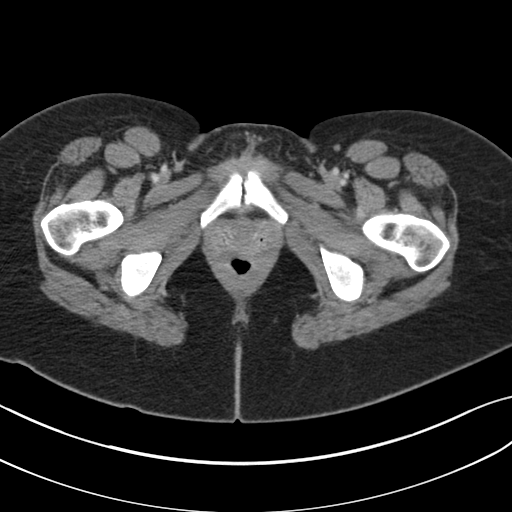
[im 20/97  soft-tissue]
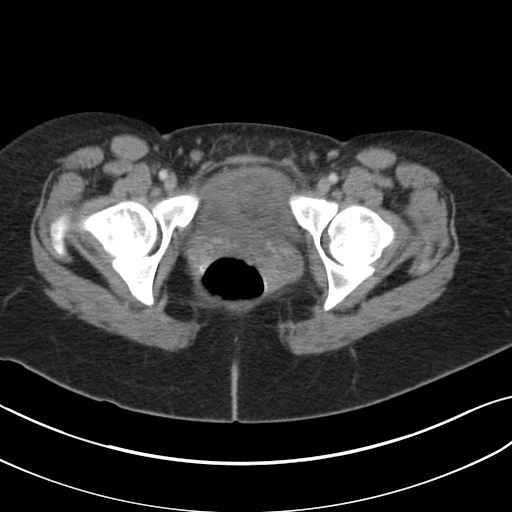
[im 29/97  soft-tissue]
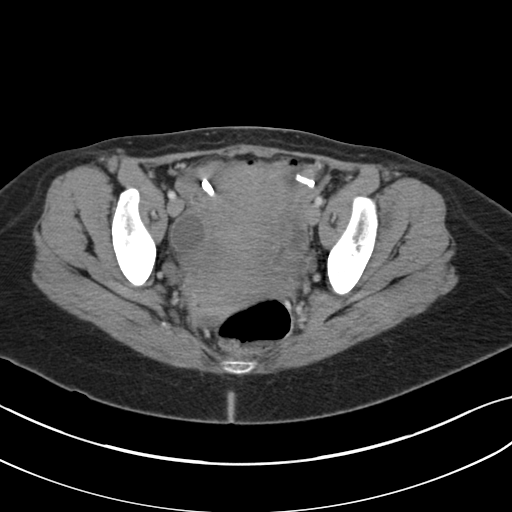
[im 34/97  soft-tissue]
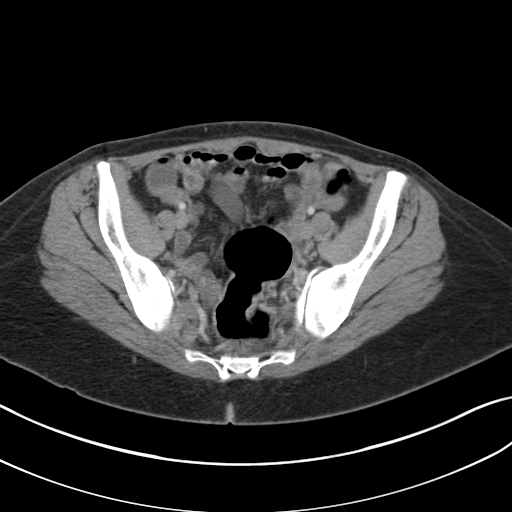
[im 44/97  soft-tissue]
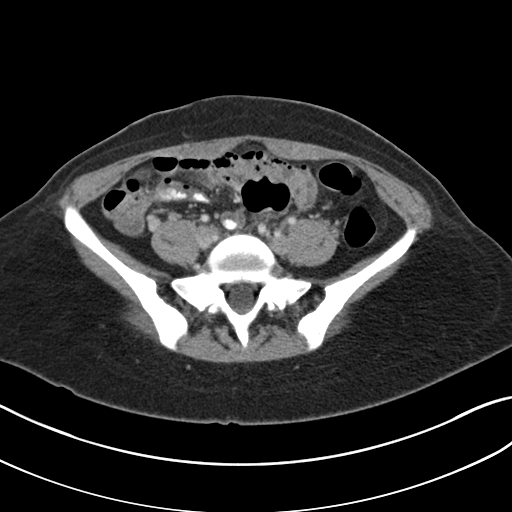
[im 49/97  soft-tissue]
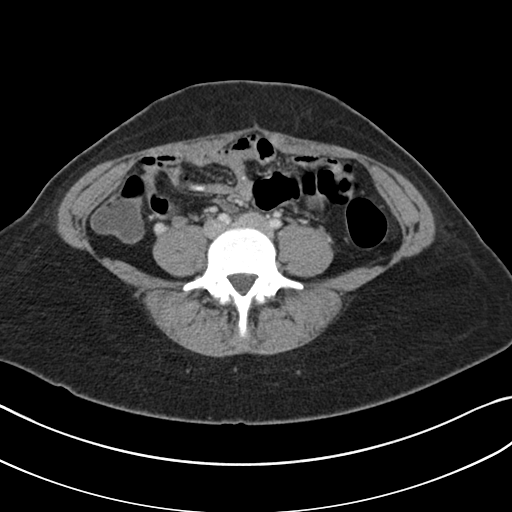
[im 53/97  soft-tissue]
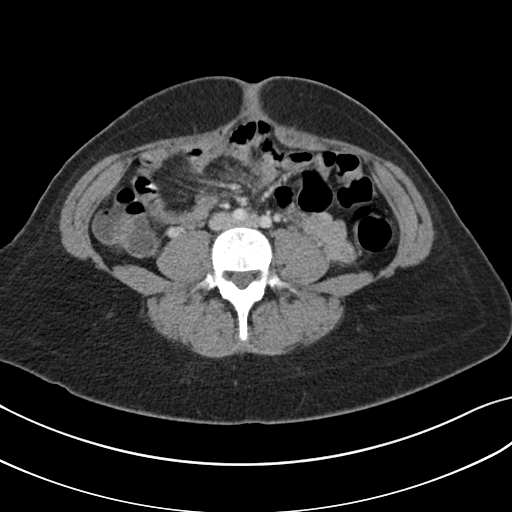
[im 63/97  soft-tissue]
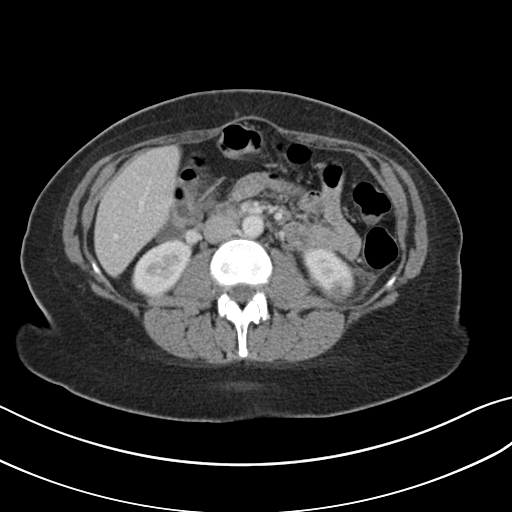
[im 63/97  bone]
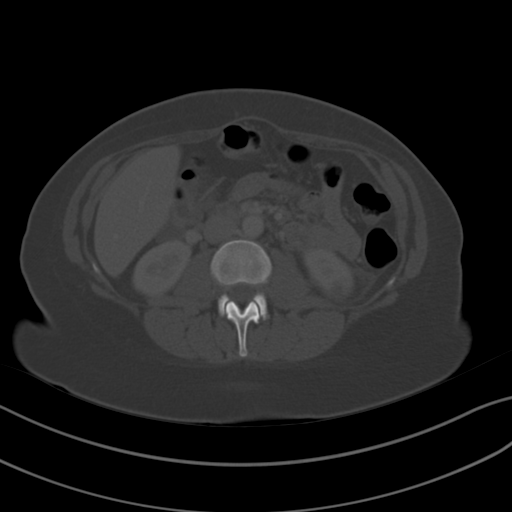
[im 68/97  soft-tissue]
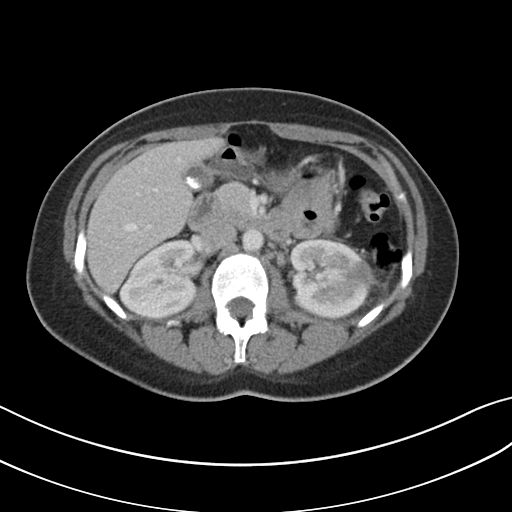
[im 77/97  soft-tissue]
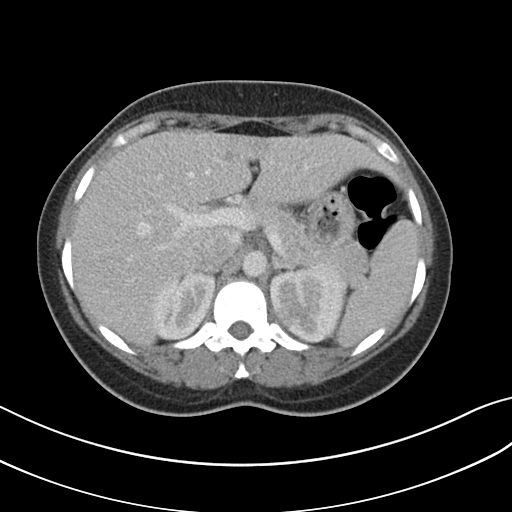
[im 82/97  soft-tissue]
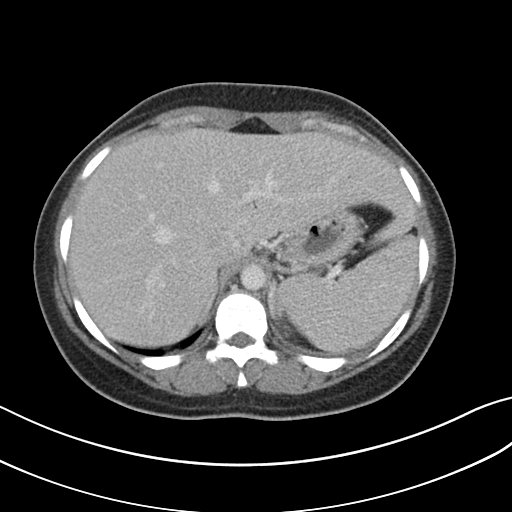
[im 92/97  soft-tissue]
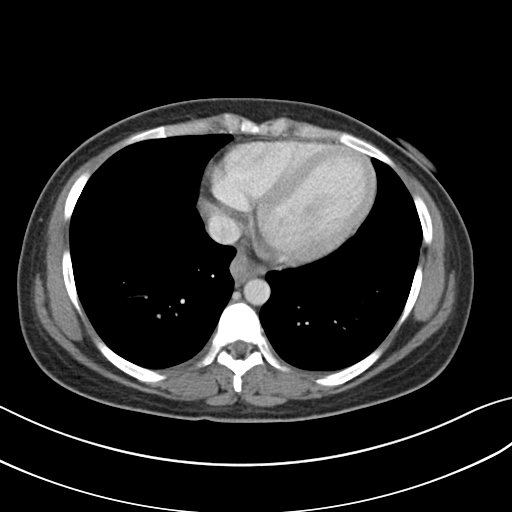

[Series 4: coronal st · coronal · 0.86mm/px · 3 of 80 slices shown]
[im 27/80  soft-tissue]
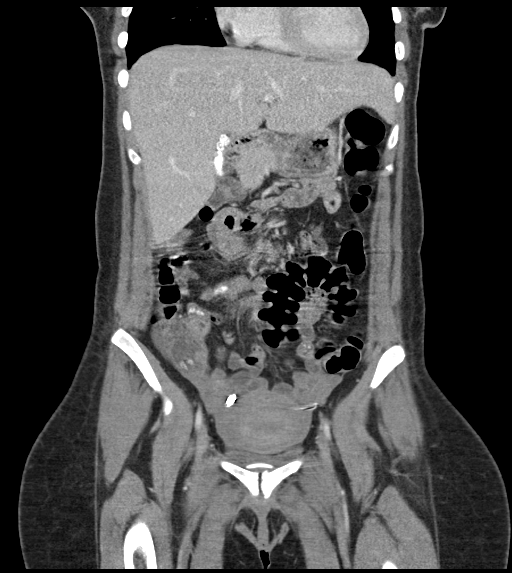
[im 36/80  soft-tissue]
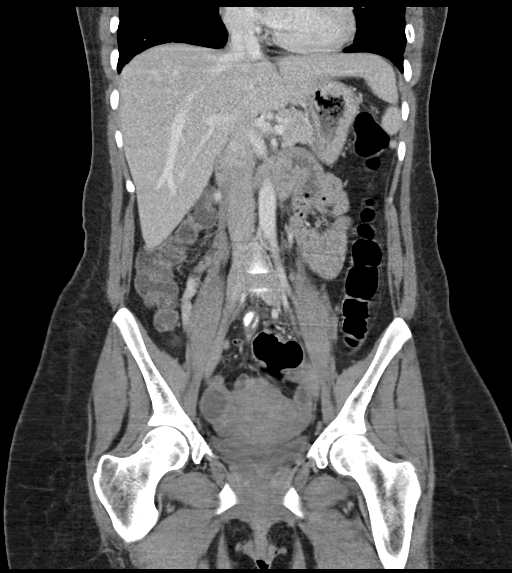
[im 44/80  soft-tissue]
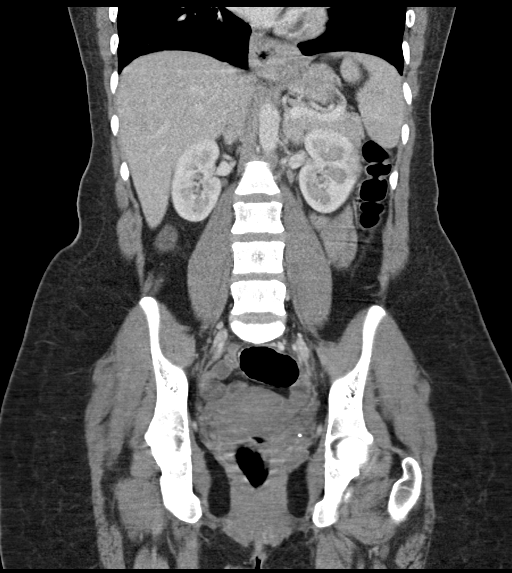

[16 of 46 positions shown; findings below may reference images not displayed]

FINDINGS: LOWER CHEST: There is no basilar pleural or apical pericardial
effusion.

HEPATOBILIARY: The hepatic contours and density are normal. There is
no intra- or extrahepatic biliary dilatation. There is
cholelithiasis without acute inflammation.

PANCREAS: The pancreatic parenchymal contours are normal and there
is no ductal dilatation. There is no peripancreatic fluid
collection.

SPLEEN: Normal.

ADRENALS/URINARY TRACT:

--Adrenal glands: Normal.

--Right kidney/ureter: No hydronephrosis, nephroureterolithiasis,
perinephric stranding or solid renal mass.

--Left kidney/ureter: No hydronephrosis, nephroureterolithiasis,
perinephric stranding or solid renal mass.

--Urinary bladder: Normal for degree of distention

STOMACH/BOWEL:

--Stomach/Duodenum: Small hiatal hernia.  Normal duodenal course.

--Small bowel: No dilatation or inflammation.

--Colon: No focal abnormality.

--Appendix: Normal.

VASCULAR/LYMPHATIC: Normal course and caliber of the major abdominal
vessels. No abdominal or pelvic lymphadenopathy.

REPRODUCTIVE: Normal uterus. Bilateral tubal ligation. Right ovarian
cyst measures 2.4 cm.

MUSCULOSKELETAL. No bony spinal canal stenosis or focal osseous
abnormality.

OTHER: None.
IMPRESSION: Normal appendix.  No acute abdominal or pelvic abnormality.

## 2021-04-13 ENCOUNTER — Emergency Department (HOSPITAL_COMMUNITY)
Admission: EM | Admit: 2021-04-13 | Discharge: 2021-04-13 | Disposition: A | Discharge status: Home / Self Care | Payer: Medicare Other | Attending: Emergency Medicine | Admitting: Emergency Medicine

## 2021-04-13 ENCOUNTER — Encounter (HOSPITAL_COMMUNITY): Payer: Self-pay | Admitting: Emergency Medicine

## 2021-04-13 ENCOUNTER — Other Ambulatory Visit: Payer: Self-pay

## 2021-04-13 DIAGNOSIS — R350 Frequency of micturition: Secondary | ICD-10-CM | POA: Insufficient documentation

## 2021-04-13 LAB — URINALYSIS, ROUTINE W REFLEX MICROSCOPIC
Bacteria, UA: NONE SEEN
Bilirubin Urine: NEGATIVE
Glucose, UA: NEGATIVE mg/dL
Hgb urine dipstick: NEGATIVE
Ketones, ur: NEGATIVE mg/dL
Nitrite: NEGATIVE
Protein, ur: NEGATIVE mg/dL
Specific Gravity, Urine: 1.025 (ref 1.005–1.030)
pH: 7 (ref 5.0–8.0)

## 2021-04-13 LAB — POC URINE PREG, ED: Preg Test, Ur: NEGATIVE

## 2021-04-13 NOTE — ED Provider Notes (Signed)
[facility] EMERGENCY DEPARTMENT Provider Note   CSN: [id number] Arrival date & time: 04/13/21  1336     History  Chief Complaint  Patient presents with   Urinary Frequency    Monique Gamble is a 31 y.o. female.  Patient presents ER chief complaint of possible urinary tract infection.  She states that for the past 2 days she has been going to the bathroom more frequently.  She states that after she goes to the bathroom for urination, the urine appears to smell unusual for her.  Otherwise denies any abdominal pain or flank pain denies any vaginal discharge no fever no cough no vomiting or diarrhea.      Home Medications Prior to Admission medications   Medication Sig Start Date End Date Taking? Authorizing Provider  amLODipine (NORVASC) 10 MG tablet Take 1 tablet (10 mg total) by mouth daily. Patient not taking: Reported on 05/10/2019 02/03/18   Gwenevere Abbot, MD  amoxicillin-clavulanate (AUGMENTIN) 875-125 MG tablet Take 1 tablet by mouth 2 (two) times daily. One po bid x 7 days 04/14/20   Dartha Lodge, PA-C  butalbital-acetaminophen-caffeine (FIORICET, ESGIC) [phone] MG tablet Take 1-2 tablets by mouth every 6 (six) hours as needed for headache. Patient not taking: Reported on 05/10/2019 02/24/18   Gerrit Heck, CNM  cephALEXin (KEFLEX) 500 MG capsule Take 1 capsule (500 mg total) by mouth 4 (four) times daily. 03/16/18   Fayrene Helper, PA-C  cyclobenzaprine (FLEXERIL) 10 MG tablet Take 10 mg by mouth 3 (three) times daily. 02/04/18   [provider]  ibuprofen (ADVIL) 600 MG tablet Take 1 tablet (600 mg total) by mouth every 6 (six) hours as needed. 04/14/20   Dartha Lodge, PA-C  lisinopril (PRINIVIL,ZESTRIL) 20 MG tablet Take 1 tablet (20 mg total) by mouth daily. Patient not taking: Reported on 05/10/2019 02/18/18   Raelyn Mora, CNM  metroNIDAZOLE (FLAGYL) 500 MG tablet Take 1 tablet (500 mg total) by mouth 2 (two) times daily. 05/12/19   Hermina Staggers, MD  naproxen (NAPROSYN) 500 MG tablet Take 1 tablet (500 mg total) by mouth 2 (two) times daily as needed. 02/29/20   Particia Nearing, PA-C  ondansetron (ZOFRAN ODT) 4 MG disintegrating tablet Take 1 tablet (4 mg total) by mouth every 8 (eight) hours as needed for nausea or vomiting. 02/29/20   Particia Nearing, PA-C  potassium chloride SA (K-DUR,KLOR-CON) 20 MEQ tablet Take 1 tablet (20 mEq total) by mouth daily. 03/16/18   Fayrene Helper, PA-C  Prenatal Vit-Fe Fumarate-FA (PRENATAL MULTIVITAMIN) TABS tablet Take 1 tablet by mouth daily at 12 noon. Patient not taking: Reported on 03/16/2018 02/03/18   Gwenevere Abbot, MD  witch hazel-glycerin (TUCKS) pad Apply 1 application topically as needed for hemorrhoids. Patient not taking: Reported on 03/16/2018 02/03/18   Gwenevere Abbot, MD      Allergies    Patient has no known allergies.    Review of Systems   Review of Systems  Constitutional:  Negative for fever.  HENT:  Negative for ear pain.   Eyes:  Negative for pain.  Respiratory:  Negative for cough.   Cardiovascular:  Negative for chest pain.  Gastrointestinal:  Negative for abdominal pain.  Genitourinary:  Negative for flank pain.  Musculoskeletal:  Negative for back pain.  Skin:  Negative for rash.  Neurological:  Negative for headaches.   Physical Exam Updated Vital Signs BP 137/82 (BP Location: Left Arm)    Pulse 77  Temp 98.2 F (36.8 C) (Oral)    Resp 15    Ht 5\' 6"  (1.676 m)    Wt 77.1 kg    SpO2 100%    BMI 27.44 kg/m  Physical Exam Constitutional:      General: She is not in acute distress.    Appearance: Normal appearance.  HENT:     Head: Normocephalic.     Nose: Nose normal.  Eyes:     Extraocular Movements: Extraocular movements intact.  Cardiovascular:     Rate and Rhythm: Normal rate.  Pulmonary:     Effort: Pulmonary effort is normal.  Abdominal:     Tenderness: There is no abdominal tenderness. There is no right CVA tenderness, left  CVA tenderness, guarding or rebound.  Musculoskeletal:        General: Normal range of motion.     Cervical back: Normal range of motion.  Neurological:     General: No focal deficit present.     Mental Status: She is alert. Mental status is at baseline.    ED Results / Procedures / Treatments   Labs (all labs ordered are listed, but only abnormal results are displayed) Labs Reviewed  URINALYSIS, ROUTINE W REFLEX MICROSCOPIC - Abnormal; Notable for the following components:      Result Value   APPearance HAZY (*)    Leukocytes,Ua TRACE (*)    All other components within normal limits  POC URINE PREG, ED    EKG None  Radiology No results found.  Procedures Procedures    Medications Ordered in ED Medications - No data to display  ED Course/ Medical Decision Making/ A&P                           Medical Decision Making Amount and/or Complexity of Data Reviewed Labs: ordered.   Review of records shows visit for reflux of 2022 for dental pain and discharged home.  Work-up today was unremarkable with no evidence of urinary tract infection.  I doubt pyelonephritis given negative exam.  Patient discharged home stable condition advised outpatient follow-up with her doctor within 1 week.  Advised immediate return for worsening symptoms or any additional concerns.        Final Clinical Impression(s) / ED Diagnoses Final diagnoses:  Urinary frequency    Rx / DC Orders ED Discharge Orders     None         2023, MD 04/13/21 [phone]

## 2021-04-13 NOTE — Discharge Instructions (Signed)
Call your primary care doctor or specialist as discussed in the next 2-3 days.   Return immediately back to the ER if:  Your symptoms worsen within the next 12-24 hours. You develop new symptoms such as new fevers, persistent vomiting, new pain, shortness of breath, or new weakness or numbness, or if you have any other concerns.  

## 2021-04-13 NOTE — ED Triage Notes (Signed)
Pt reports vaginal odor and urinary frequency x 2 days.

## 2021-04-14 NOTE — ED Notes (Signed)
Pt left prior to receiving paperwork from this RN or Chad,RN. Vitals were not obtained for this reason. Prior to leaving, the pt was stable in room, no complaints, with her children at bedside.

## 2021-08-31 ENCOUNTER — Ambulatory Visit (HOSPITAL_COMMUNITY)
Admission: EM | Admit: 2021-08-31 | Discharge: 2021-08-31 | Disposition: A | Discharge status: Home / Self Care | Payer: Medicare Other | Attending: Internal Medicine | Admitting: Internal Medicine

## 2021-08-31 DIAGNOSIS — R300 Dysuria: Secondary | ICD-10-CM | POA: Diagnosis present

## 2021-08-31 DIAGNOSIS — K047 Periapical abscess without sinus: Secondary | ICD-10-CM | POA: Insufficient documentation

## 2021-08-31 DIAGNOSIS — N390 Urinary tract infection, site not specified: Secondary | ICD-10-CM | POA: Diagnosis present

## 2021-08-31 DIAGNOSIS — K0889 Other specified disorders of teeth and supporting structures: Secondary | ICD-10-CM | POA: Diagnosis present

## 2021-08-31 DIAGNOSIS — R350 Frequency of micturition: Secondary | ICD-10-CM | POA: Diagnosis present

## 2021-08-31 LAB — POCT URINALYSIS DIPSTICK, ED / UC
Bilirubin Urine: NEGATIVE
Glucose, UA: NEGATIVE mg/dL
Hgb urine dipstick: NEGATIVE
Ketones, ur: NEGATIVE mg/dL
Nitrite: NEGATIVE
Protein, ur: 30 mg/dL — AB
Specific Gravity, Urine: 1.02 (ref 1.005–1.030)
Urobilinogen, UA: 1 mg/dL (ref 0.0–1.0)
pH: 8.5 — ABNORMAL HIGH (ref 5.0–8.0)

## 2021-08-31 MED ORDER — AMOXICILLIN-POT CLAVULANATE 875-125 MG PO TABS: 1.0000 | ORAL_TABLET | Freq: Two times a day (BID) | ORAL | 0 refills | Status: DC

## 2021-08-31 NOTE — ED Triage Notes (Signed)
Pt presents today with pain on L side bottom mouth and swelling pt reports pain has been 2 days   Pt reports uti symptoms urinary frequency and burning with malodor urine

## 2021-08-31 NOTE — ED Provider Notes (Addendum)
[facility]    CSN: [id number] Arrival date & time: 08/31/21  1744      History   Chief Complaint Chief Complaint  Patient presents with   Dental Pain    HPI Monique Gamble is a 31 y.o. female.   Patient presents with 2 different chief complaints today.  She presents with left lower dental pain that has been present for about 2 days.  She has not taken any medications for pain.  Denies history of the same.  Denies fever, body aches, chills, purulent drainage from mouth.  Denies trauma to the mouth.  Patient also presenting with urinary burning, malodorous urine, and frequency that started about 3 days ago.  Denies vaginal discharge, hematuria, abnormal vaginal bleeding, abdominal pain, pelvic pain, back pain, fever.  States that she is currently on her menstrual cycle and has had a tubal ligation so denies chance of pregnancy.   Dental Pain   Past Medical History:  Diagnosis Date   ADHD (attention deficit hyperactivity disorder)    Chlamydia    Gestational diabetes    Headache    History of anemia    Hypertension    Pregnancy induced hypertension     Patient Active Problem List   Diagnosis Date Noted   Essential hypertension-postpartum 02/18/2018   Indication for care in labor or delivery 02/01/2018   Grand multipara 01/20/2018   GDM (gestational diabetes mellitus) 12/24/2017   Chronic hypertension during pregnancy, antepartum 12/03/2017   Anemia in pregnancy 11/04/2017   No prenatal care in current pregnancy 08/22/2016   H/O pre-eclampsia in prior pregnancy, currently pregnant 01/30/2015   Supervision of high-risk pregnancy 12/19/2014   Rubella non-immune status, antepartum 02/13/2014    Past Surgical History:  Procedure Laterality Date   INDUCED ABORTION     TUBAL LIGATION Bilateral 02/01/2018   Procedure: POST PARTUM TUBAL LIGATION;  Surgeon: West Line Bing, MD;  Location: [facility] B[facility] SU[facility];  Service: Gynecology;  Laterality: Bilateral;     OB History     Gravida  8   Para  6   Term  6   Preterm  0   AB  2   Living  6      SAB  1   IAB  1   Ectopic  0   Multiple  0   Live Births  6            Home Medications    Prior to Admission medications   Medication Sig Start Date End Date Taking? Authorizing Provider  amoxicillin-clavulanate (AUGMENTIN) 875-125 MG tablet Take 1 tablet by mouth every 12 (twelve) hours. 08/31/21  Yes Ajanee Buren, Rolly Salter E, FNP  amLODipine (NORVASC) 10 MG tablet Take 1 tablet (10 mg total) by mouth daily. Patient not taking: Reported on 05/10/2019 02/03/18   Gwenevere Abbot, MD  butalbital-acetaminophen-caffeine (FIORICET, ESGIC) [phone] MG tablet Take 1-2 tablets by mouth every 6 (six) hours as needed for headache. Patient not taking: Reported on 05/10/2019 02/24/18   Gerrit Heck, CNM  cyclobenzaprine (FLEXERIL) 10 MG tablet Take 10 mg by mouth 3 (three) times daily. 02/04/18   [provider]  lisinopril (PRINIVIL,ZESTRIL) 20 MG tablet Take 1 tablet (20 mg total) by mouth daily. Patient not taking: Reported on 05/10/2019 02/18/18   Raelyn Mora, CNM  naproxen (NAPROSYN) 500 MG tablet Take 1 tablet (500 mg total) by mouth 2 (two) times daily as needed. 02/29/20   Particia Nearing, PA-C  ondansetron (ZOFRAN ODT) 4 MG disintegrating tablet Take  1 tablet (4 mg total) by mouth every 8 (eight) hours as needed for nausea or vomiting. 02/29/20   Particia Nearing, PA-C  potassium chloride SA (K-DUR,KLOR-CON) 20 MEQ tablet Take 1 tablet (20 mEq total) by mouth daily. 03/16/18   Fayrene Helper, PA-C  Prenatal Vit-Fe Fumarate-FA (PRENATAL MULTIVITAMIN) TABS tablet Take 1 tablet by mouth daily at 12 noon. Patient not taking: Reported on 03/16/2018 02/03/18   Gwenevere Abbot, MD  witch hazel-glycerin (TUCKS) pad Apply 1 application topically as needed for hemorrhoids. Patient not taking: Reported on 03/16/2018 02/03/18   Gwenevere Abbot, MD    Family History Family History   Problem Relation Age of Onset   Hypertension Mother    Hypertension Father    Diabetes Sister    Anesthesia problems Neg Hx     Social History Social History   Tobacco Use   Smoking status: Never   Smokeless tobacco: Never  Vaping Use   Vaping Use: Never used  Substance Use Topics   Alcohol use: No   Drug use: Yes    Types: Marijuana    Comment: last use 03 Jul 2017     Allergies   Patient has no known allergies.   Review of Systems Review of Systems Per HPI  Physical Exam Triage Vital Signs ED Triage Vitals  Enc Vitals Group     BP 08/31/21 1820 (!) 145/89     Pulse Rate 08/31/21 1820 89     Resp 08/31/21 1820 18     Temp 08/31/21 1820 98.3 F (36.8 C)     Temp src --      SpO2 08/31/21 1820 98 %     Weight --      Height --      Head Circumference --      Peak Flow --      Pain Score 08/31/21 1819 8     Pain Loc --      Pain Edu? --      Excl. in GC? --    No data found.  Updated Vital Signs BP (!) 145/89   Pulse 89   Temp 98.3 F (36.8 C)   Resp 18   LMP  (Within Months)   SpO2 98%   Visual Acuity Right Eye Distance:   Left Eye Distance:   Bilateral Distance:    Right Eye Near:   Left Eye Near:    Bilateral Near:     Physical Exam Constitutional:      General: She is not in acute distress.    Appearance: Normal appearance. She is not toxic-appearing or diaphoretic.  HENT:     Head: Normocephalic and atraumatic.     Mouth/Throat:     Lips: Pink.     Mouth: Mucous membranes are moist.     Dentition: Dental tenderness and gingival swelling present.     Comments: Left lower gingival swelling and erythema.  No obvious drainage. Eyes:     Extraocular Movements: Extraocular movements intact.     Conjunctiva/sclera: Conjunctivae normal.  Cardiovascular:     Rate and Rhythm: Normal rate and regular rhythm.     Pulses: Normal pulses.     Heart sounds: Normal heart sounds.  Pulmonary:     Effort: Pulmonary effort is normal. No  respiratory distress.     Breath sounds: Normal breath sounds.  Abdominal:     General: Bowel sounds are normal. There is no distension.     Palpations: Abdomen is soft.  Tenderness: There is no abdominal tenderness.  Neurological:     General: No focal deficit present.     Mental Status: She is alert and oriented to person, place, and time. Mental status is at baseline.  Psychiatric:        Mood and Affect: Mood normal.        Behavior: Behavior normal.        Thought Content: Thought content normal.        Judgment: Judgment normal.      UC Treatments / Results  Labs (all labs ordered are listed, but only abnormal results are displayed) Labs Reviewed  POCT URINALYSIS DIPSTICK, ED / UC - Abnormal; Notable for the following components:      Result Value   pH 8.5 (*)    Protein, ur 30 (*)    Leukocytes,Ua SMALL (*)    All other components within normal limits  URINE CULTURE    EKG   Radiology No results found.  Procedures Procedures (including critical care time)  Medications Ordered in UC Medications - No data to display  Initial Impression / Assessment and Plan / UC Course  I have reviewed the triage vital signs and the nursing notes.  Pertinent labs & imaging results that were available during my care of the patient were reviewed by me and considered in my medical decision making (see chart for details).     Patient has dental infection and urinary tract infection noted on UA.  Will send urine culture to confirm.  Will treat with Augmentin as this should cover for both etiologies.  Suspect protein in urine is from uti versus low po intake as patient denies any other comorbidities. Patient advised to follow-up with dentist for further evaluation and management of dental infection.  Discussed supportive care for dental pain such as warm compresses.  Patient was given strict return precautions.  Patient verbalized understanding and was agreeable with plan. Final  Clinical Impressions(s) / UC Diagnoses   Final diagnoses:  Lower urinary tract infection  Pain, dental  Dental infection  Dysuria  Urinary frequency     Discharge Instructions      You have a urinary tract infection and a dental infection which are both being treated with Augmentin antibiotic.  Please follow-up with dentist.  Urine culture is pending.  We will call if it is abnormal.   ED Prescriptions     Medication Sig Dispense Auth. Provider   amoxicillin-clavulanate (AUGMENTIN) 875-125 MG tablet Take 1 tablet by mouth every 12 (twelve) hours. 14 tablet [place], Acie Fredrickson, [place]      PDMP not reviewed this encounter.   Gustavus Bryant, [place] 08/31/21 1849    Gustavus Bryant, [place] 08/31/21 1849

## 2021-08-31 NOTE — Discharge Instructions (Signed)
You have a urinary tract infection and a dental infection which are both being treated with Augmentin antibiotic.  Please follow-up with dentist.  Urine culture is pending.  We will call if it is abnormal.

## 2021-09-01 LAB — URINE CULTURE

## 2021-09-29 ENCOUNTER — Encounter (HOSPITAL_COMMUNITY): Payer: Self-pay | Admitting: Emergency Medicine

## 2021-09-29 ENCOUNTER — Telehealth (HOSPITAL_COMMUNITY): Payer: Self-pay | Admitting: Emergency Medicine

## 2021-09-29 ENCOUNTER — Ambulatory Visit (HOSPITAL_COMMUNITY)
Admission: EM | Admit: 2021-09-29 | Discharge: 2021-09-29 | Disposition: A | Discharge status: Home / Self Care | Payer: Medicare Other | Attending: Internal Medicine | Admitting: Internal Medicine

## 2021-09-29 DIAGNOSIS — N760 Acute vaginitis: Secondary | ICD-10-CM | POA: Insufficient documentation

## 2021-09-29 DIAGNOSIS — N3090 Cystitis, unspecified without hematuria: Secondary | ICD-10-CM | POA: Diagnosis present

## 2021-09-29 DIAGNOSIS — R300 Dysuria: Secondary | ICD-10-CM | POA: Insufficient documentation

## 2021-09-29 DIAGNOSIS — R350 Frequency of micturition: Secondary | ICD-10-CM | POA: Diagnosis present

## 2021-09-29 LAB — POCT URINALYSIS DIPSTICK, ED / UC
Glucose, UA: NEGATIVE mg/dL
Ketones, ur: 15 mg/dL — AB
Nitrite: POSITIVE — AB
Protein, ur: 100 mg/dL — AB
Specific Gravity, Urine: 1.025 (ref 1.005–1.030)
Urobilinogen, UA: 1 mg/dL (ref 0.0–1.0)
pH: 6.5 (ref 5.0–8.0)

## 2021-09-29 LAB — HIV ANTIBODY (ROUTINE TESTING W REFLEX): HIV Screen 4th Generation wRfx: NONREACTIVE

## 2021-09-29 MED ORDER — PHENAZOPYRIDINE HCL 200 MG PO TABS: 200.0000 mg | ORAL_TABLET | Freq: Three times a day (TID) | ORAL | 0 refills | Status: DC

## 2021-09-29 MED ORDER — NITROFURANTOIN MONOHYD MACRO 100 MG PO CAPS: 100.0000 mg | ORAL_CAPSULE | Freq: Two times a day (BID) | ORAL | 0 refills | Status: DC

## 2021-09-29 NOTE — Discharge Instructions (Signed)
Lab testing will be completed in 48 hours, if you do not hear from our office that indicates the results are negative or normal.  You can go on MyChart to review the results at 24 and 48 hours. Advised increase fluid intake with clear liquids and cranberry juice to flush the urinary system. Advised to take the Macrobid capsules 1 every 12 hours to treat the urinary tract infection. Advised to take the Pyridium 1 every 6-8 hours to treat the burning with urination, this will turn your urine a different color. Advised to follow-up with PCP or return to urgent care if symptoms fail to improve.

## 2021-09-29 NOTE — ED Triage Notes (Addendum)
Reports new burning on urination starting two days prior with lower back pain, denies fever, abdominal pain, N/V/D. Reports white vaginal discharge with mild itching. One sexual partner over the last month, no condom use.  Patient is interested in STD testing, including bloodwork

## 2021-09-29 NOTE — Telephone Encounter (Signed)
Patient called and states insurance will not cover Pyridium.  Reviewed OTC options and patient verbalized understanding

## 2021-09-29 NOTE — ED Provider Notes (Signed)
[facility]    CSN: [id number] Arrival date & time: 09/29/21  1054      History   Chief Complaint Chief Complaint  Patient presents with   Dysuria    HPI Monique Gamble is a 31 y.o. female.   31 year old female presents with dysuria and vaginal discharge.  Patient indicates for the past 2 days she has been having increased frequency, urgency, and dysuria.  Patient relates that yesterday she started having some lower back pain associated.  She denies any fever, chills or nausea.  Patient relates she has had a bladder infection before and this is what it felt like last time. Patient also indicates that she has been having some white vaginal discharge over the past couple days, she describes this as thick.  Patient indicates she also gets some itching associated.  She relates that she had a BTL performed in 2019, and her last period was July 19 and normal.   Dysuria Associated symptoms: vaginal discharge (white and thick)     Past Medical History:  Diagnosis Date   ADHD (attention deficit hyperactivity disorder)    Chlamydia    Gestational diabetes    Headache    History of anemia    Hypertension    Pregnancy induced hypertension     Patient Active Problem List   Diagnosis Date Noted   Essential hypertension-postpartum 02/18/2018   Indication for care in labor or delivery 02/01/2018   Grand multipara 01/20/2018   GDM (gestational diabetes mellitus) 12/24/2017   Chronic hypertension during pregnancy, antepartum 12/03/2017   Anemia in pregnancy 11/04/2017   No prenatal care in current pregnancy 08/22/2016   H/O pre-eclampsia in prior pregnancy, currently pregnant 01/30/2015   Supervision of high-risk pregnancy 12/19/2014   Rubella non-immune status, antepartum 02/13/2014    Past Surgical History:  Procedure Laterality Date   INDUCED ABORTION     TUBAL LIGATION Bilateral 02/01/2018   Procedure: POST PARTUM TUBAL LIGATION;  Surgeon: [place] Bing,  MD;  Location: [facility];  Service: Gynecology;  Laterality: Bilateral;    OB History     Gravida  8   Para  6   Term  6   Preterm  0   AB  2   Living  6      SAB  1   IAB  1   Ectopic  0   Multiple  0   Live Births  6            Home Medications    Prior to Admission medications   Medication Sig Start Date End Date Taking? Authorizing Provider  nitrofurantoin, macrocrystal-monohydrate, (MACROBID) 100 MG capsule Take 1 capsule (100 mg total) by mouth 2 (two) times daily. 09/29/21  Yes Ellsworth Lennox, PA-C  phenazopyridine (PYRIDIUM) 200 MG tablet Take 1 tablet (200 mg total) by mouth 3 (three) times daily. 09/29/21  Yes Ellsworth Lennox, PA-C  amLODipine (NORVASC) 10 MG tablet Take 1 tablet (10 mg total) by mouth daily. Patient not taking: Reported on 05/10/2019 02/03/18   Gwenevere Abbot, MD  amoxicillin-clavulanate (AUGMENTIN) 875-125 MG tablet Take 1 tablet by mouth every 12 (twelve) hours. 08/31/21   [place], Acie Fredrickson, FNP  butalbital-acetaminophen-caffeine (FIORICET, ESGIC) [phone] MG tablet Take 1-2 tablets by mouth every 6 (six) hours as needed for headache. Patient not taking: Reported on 05/10/2019 02/24/18   Gerrit Heck, CNM  cyclobenzaprine (FLEXERIL) 10 MG tablet Take 10 mg by mouth 3 (three) times daily. 02/04/18   [provider]  lisinopril (PRINIVIL,ZESTRIL) 20 MG tablet Take 1 tablet (20 mg total) by mouth daily. Patient not taking: Reported on 05/10/2019 02/18/18   Raelyn Mora, CNM  naproxen (NAPROSYN) 500 MG tablet Take 1 tablet (500 mg total) by mouth 2 (two) times daily as needed. 02/29/20   Particia Nearing, PA-C  ondansetron (ZOFRAN ODT) 4 MG disintegrating tablet Take 1 tablet (4 mg total) by mouth every 8 (eight) hours as needed for nausea or vomiting. 02/29/20   Particia Nearing, PA-C  potassium chloride SA (K-DUR,KLOR-CON) 20 MEQ tablet Take 1 tablet (20 mEq total) by mouth daily. 03/16/18   Fayrene Helper, PA-C  Prenatal  Vit-Fe Fumarate-FA (PRENATAL MULTIVITAMIN) TABS tablet Take 1 tablet by mouth daily at 12 noon. Patient not taking: Reported on 03/16/2018 02/03/18   Gwenevere Abbot, MD  witch hazel-glycerin (TUCKS) pad Apply 1 application topically as needed for hemorrhoids. Patient not taking: Reported on 03/16/2018 02/03/18   Gwenevere Abbot, MD    Family History Family History  Problem Relation Age of Onset   Hypertension Mother    Hypertension Father    Diabetes Sister    Anesthesia problems Neg Hx     Social History Social History   Tobacco Use   Smoking status: Never   Smokeless tobacco: Never  Vaping Use   Vaping Use: Never used  Substance Use Topics   Alcohol use: No   Drug use: Yes    Types: Marijuana    Comment: last use 03 Jul 2017     Allergies   Patient has no known allergies.   Review of Systems Review of Systems  Genitourinary:  Positive for dysuria, frequency, urgency and vaginal discharge (white and thick).  Musculoskeletal:  Positive for back pain.     Physical Exam Triage Vital Signs ED Triage Vitals  Enc Vitals Group     BP 09/29/21 1213 (!) 167/97     Pulse Rate 09/29/21 1213 74     Resp 09/29/21 1213 16     Temp 09/29/21 1213 98 F (36.7 C)     Temp Source 09/29/21 1213 Oral     SpO2 09/29/21 1213 100 %     Weight --      Height --      Head Circumference --      Peak Flow --      Pain Score 09/29/21 1216 0     Pain Loc --      Pain Edu? --      Excl. in [place]? --    No data found.  Updated Vital Signs BP (!) 167/97 (BP Location: Right Arm)   Pulse 74   Temp 98 F (36.7 C) (Oral)   Resp 16   LMP  (Within Months) Comment: Tubal ligation  SpO2 100%   Visual Acuity Right Eye Distance:   Left Eye Distance:   Bilateral Distance:    Right Eye Near:   Left Eye Near:    Bilateral Near:     Physical Exam Constitutional:      Appearance: Normal appearance.  Cardiovascular:     Rate and Rhythm: Normal rate and regular rhythm.     Heart  sounds: Normal heart sounds.  Abdominal:     General: Abdomen is flat. Bowel sounds are normal.     Palpations: Abdomen is soft.     Tenderness: There is no abdominal tenderness. There is no guarding or rebound.  Neurological:     Mental Status: She is alert.  UC Treatments / Results  Labs (all labs ordered are listed, but only abnormal results are displayed) Labs Reviewed  POCT URINALYSIS DIPSTICK, ED / UC - Abnormal; Notable for the following components:      Result Value   Bilirubin Urine SMALL (*)    Ketones, ur 15 (*)    Hgb urine dipstick TRACE (*)    Protein, ur 100 (*)    Nitrite POSITIVE (*)    Leukocytes,Ua SMALL (*)    All other components within normal limits  URINE CULTURE  RPR  HIV ANTIBODY (ROUTINE TESTING W REFLEX)  CERVICOVAGINAL ANCILLARY ONLY    EKG   Radiology No results found.  Procedures Procedures (including critical care time)  Medications Ordered in UC Medications - No data to display  Initial Impression / Assessment and Plan / UC Course  I have reviewed the triage vital signs and the nursing notes.  Pertinent labs & imaging results that were available during my care of the patient were reviewed by me and considered in my medical decision making (see chart for details).    Plan: 1.  STI testing to include HIV and RPR are pending. 2.  Advised to take the Macrobid 1 twice daily until completed to treat the cystitis. 3.  Advised to take the Pyridium tablets 1 every 6 hours to treat the burning on urination. 4.  Urine culture is pending. 5.  Advised to follow-up with PCP or return to urgent care if symptoms fail to improve. Final Clinical Impressions(s) / UC Diagnoses   Final diagnoses:  Cystitis  Dysuria  Urinary frequency  Vaginitis and vulvovaginitis     Discharge Instructions      Lab testing will be completed in 48 hours, if you do not hear from our office that indicates the results are negative or normal.  You can go on  MyChart to review the results at 24 and 48 hours. Advised increase fluid intake with clear liquids and cranberry juice to flush the urinary system. Advised to take the Macrobid capsules 1 every 12 hours to treat the urinary tract infection. Advised to take the Pyridium 1 every 6-8 hours to treat the burning with urination, this will turn your urine a different color. Advised to follow-up with PCP or return to urgent care if symptoms fail to improve.   ED Prescriptions     Medication Sig Dispense Auth. Provider   nitrofurantoin, macrocrystal-monohydrate, (MACROBID) 100 MG capsule Take 1 capsule (100 mg total) by mouth 2 (two) times daily. 10 capsule Ellsworth Lennox, PA-C   phenazopyridine (PYRIDIUM) 200 MG tablet Take 1 tablet (200 mg total) by mouth 3 (three) times daily. 6 tablet Ellsworth Lennox, PA-C      PDMP not reviewed this encounter.   Ellsworth Lennox, PA-C 09/29/21 1301

## 2021-09-30 ENCOUNTER — Telehealth (HOSPITAL_COMMUNITY): Payer: Self-pay | Admitting: Emergency Medicine

## 2021-09-30 LAB — CERVICOVAGINAL ANCILLARY ONLY
Bacterial Vaginitis (gardnerella): POSITIVE — AB
Candida Glabrata: NEGATIVE
Candida Vaginitis: POSITIVE — AB
Chlamydia: NEGATIVE
Comment: NEGATIVE
Comment: NEGATIVE
Comment: NEGATIVE
Comment: NEGATIVE
Comment: NEGATIVE
Comment: NORMAL
Neisseria Gonorrhea: NEGATIVE
Trichomonas: POSITIVE — AB

## 2021-09-30 LAB — RPR: RPR Ser Ql: NONREACTIVE

## 2021-09-30 MED ORDER — METRONIDAZOLE 500 MG PO TABS: 500.0000 mg | ORAL_TABLET | Freq: Two times a day (BID) | ORAL | 0 refills | Status: DC

## 2021-09-30 MED ORDER — FLUCONAZOLE 150 MG PO TABS: 150.0000 mg | ORAL_TABLET | Freq: Once | ORAL | 0 refills | Status: AC

## 2021-10-01 LAB — URINE CULTURE: Culture: >=100000 — AB

## 2021-10-25 ENCOUNTER — Ambulatory Visit
Admission: EM | Admit: 2021-10-25 | Discharge: 2021-10-25 | Disposition: A | Discharge status: Home / Self Care | Payer: Medicare Other | Attending: Internal Medicine | Admitting: Internal Medicine

## 2021-10-25 DIAGNOSIS — Z113 Encounter for screening for infections with a predominantly sexual mode of transmission: Secondary | ICD-10-CM | POA: Insufficient documentation

## 2021-10-25 DIAGNOSIS — R350 Frequency of micturition: Secondary | ICD-10-CM | POA: Diagnosis present

## 2021-10-25 DIAGNOSIS — R300 Dysuria: Secondary | ICD-10-CM | POA: Diagnosis present

## 2021-10-25 DIAGNOSIS — N3001 Acute cystitis with hematuria: Secondary | ICD-10-CM

## 2021-10-25 LAB — POCT URINALYSIS DIP (MANUAL ENTRY)
Glucose, UA: NEGATIVE mg/dL
Nitrite, UA: POSITIVE — AB
Protein Ur, POC: >=300 mg/dL — AB
Spec Grav, UA: >=1.03 — AB (ref 1.010–1.025)
Urobilinogen, UA: 1 E.U./dL
pH, UA: 6.5 (ref 5.0–8.0)

## 2021-10-25 MED ORDER — CEPHALEXIN 500 MG PO CAPS: 500.0000 mg | ORAL_CAPSULE | Freq: Four times a day (QID) | ORAL | 0 refills | Status: DC

## 2021-10-25 NOTE — ED Triage Notes (Signed)
Pt c/o dysuria and frequency x 3 days

## 2021-10-25 NOTE — Discharge Instructions (Signed)
It appears that you have a urinary tract infection so you are being treated with an antibiotic.  Urine culture is pending.  Your vaginal swab to test for STDs is also pending.  Please refrain from sexual activity until test results and treatment are complete.  I recommend that you follow-up with urologist given that you are having frequent urinary tract infections as well.  Please see provided contact information.

## 2021-10-25 NOTE — ED Provider Notes (Signed)
[facility]    CSN: [id number] Arrival date & time: 10/25/21  0836      History   Chief Complaint Chief Complaint  Patient presents with   uti sxs    HPI Monique Gamble is a 31 y.o. female.   Patient presents with 3 day history of dysuria, mild hematuria, and urinary frequency. Denies vaginal discharge, abnormal vaginal bleeding, abdominal pain, back pain, fever. Patient would also like STD testing. She tested positive for trichomoniasis about a month ago and received treatment. She had sexual intercourse again with same sexual partner and is not sure if they were tested or treated for trichomonas.  She denies any current symptoms including vaginal discharge.    Past Medical History:  Diagnosis Date   ADHD (attention deficit hyperactivity disorder)    Chlamydia    Gestational diabetes    Headache    History of anemia    Hypertension    Pregnancy induced hypertension     Patient Active Problem List   Diagnosis Date Noted   Essential hypertension-postpartum 02/18/2018   Indication for care in labor or delivery 02/01/2018   Grand multipara 01/20/2018   GDM (gestational diabetes mellitus) 12/24/2017   Chronic hypertension during pregnancy, antepartum 12/03/2017   Anemia in pregnancy 11/04/2017   No prenatal care in current pregnancy 08/22/2016   H/O pre-eclampsia in prior pregnancy, currently pregnant 01/30/2015   Supervision of high-risk pregnancy 12/19/2014   Rubella non-immune status, antepartum 02/13/2014    Past Surgical History:  Procedure Laterality Date   INDUCED ABORTION     TUBAL LIGATION Bilateral 02/01/2018   Procedure: POST PARTUM TUBAL LIGATION;  Surgeon: [place] Bing, MD;  Location: [facility];  Service: Gynecology;  Laterality: Bilateral;    OB History     Gravida  8   Para  6   Term  6   Preterm  0   AB  2   Living  6      SAB  1   IAB  1   Ectopic  0   Multiple  0   Live Births  6             Home Medications    Prior to Admission medications   Medication Sig Start Date End Date Taking? Authorizing Provider  cephALEXin (KEFLEX) 500 MG capsule Take 1 capsule (500 mg total) by mouth 4 (four) times daily. 10/25/21  Yes Avielle Imbert, Rolly Salter E, FNP  amLODipine (NORVASC) 10 MG tablet Take 1 tablet (10 mg total) by mouth daily. Patient not taking: Reported on 05/10/2019 02/03/18   Gwenevere Abbot, MD  butalbital-acetaminophen-caffeine (FIORICET, ESGIC) [phone] MG tablet Take 1-2 tablets by mouth every 6 (six) hours as needed for headache. Patient not taking: Reported on 05/10/2019 02/24/18   Gerrit Heck, CNM  cyclobenzaprine (FLEXERIL) 10 MG tablet Take 10 mg by mouth 3 (three) times daily. 02/04/18   [provider]  lisinopril (PRINIVIL,ZESTRIL) 20 MG tablet Take 1 tablet (20 mg total) by mouth daily. Patient not taking: Reported on 05/10/2019 02/18/18   Raelyn Mora, CNM  naproxen (NAPROSYN) 500 MG tablet Take 1 tablet (500 mg total) by mouth 2 (two) times daily as needed. 02/29/20   Particia Nearing, PA-C  ondansetron (ZOFRAN ODT) 4 MG disintegrating tablet Take 1 tablet (4 mg total) by mouth every 8 (eight) hours as needed for nausea or vomiting. 02/29/20   Particia Nearing, PA-C  phenazopyridine (PYRIDIUM) 200 MG tablet Take 1 tablet (200 mg total)  by mouth 3 (three) times daily. 09/29/21   Nyoka Lint, PA-C  potassium chloride SA (K-DUR,KLOR-CON) 20 MEQ tablet Take 1 tablet (20 mEq total) by mouth daily. 03/16/18   Domenic Moras, PA-C  Prenatal Vit-Fe Fumarate-FA (PRENATAL MULTIVITAMIN) TABS tablet Take 1 tablet by mouth daily at 12 noon. Patient not taking: Reported on 03/16/2018 02/03/18   Aura Camps, MD  witch hazel-glycerin (TUCKS) pad Apply 1 application topically as needed for hemorrhoids. Patient not taking: Reported on 03/16/2018 02/03/18   Aura Camps, MD    Family History Family History  Problem Relation Age of Onset   Hypertension Mother     Hypertension Father    Diabetes Sister    Anesthesia problems Neg Hx     Social History Social History   Tobacco Use   Smoking status: Never   Smokeless tobacco: Never  Vaping Use   Vaping Use: Never used  Substance Use Topics   Alcohol use: No   Drug use: Yes    Types: Marijuana    Comment: last use 03 Jul 2017     Allergies   Patient has no known allergies.   Review of Systems Review of Systems Per HPI  Physical Exam Triage Vital Signs ED Triage Vitals  Enc Vitals Group     BP 10/25/21 0852 (!) 134/90     Pulse Rate 10/25/21 0852 82     Resp 10/25/21 0852 16     Temp 10/25/21 0852 98 F (36.7 C)     Temp Source 10/25/21 0852 Oral     SpO2 10/25/21 0852 98 %     Weight --      Height --      Head Circumference --      Peak Flow --      Pain Score 10/25/21 0853 10     Pain Loc --      Pain Edu? --      Excl. in [place]? --    No data found.  Updated Vital Signs BP (!) 134/90 (BP Location: Left Arm)   Pulse 82   Temp 98 F (36.7 C) (Oral)   Resp 16   LMP  (Within Months)   SpO2 98%   Visual Acuity Right Eye Distance:   Left Eye Distance:   Bilateral Distance:    Right Eye Near:   Left Eye Near:    Bilateral Near:     Physical Exam Constitutional:      General: She is not in acute distress.    Appearance: Normal appearance. She is not toxic-appearing or diaphoretic.  HENT:     Head: Normocephalic and atraumatic.  Eyes:     Extraocular Movements: Extraocular movements intact.     Conjunctiva/sclera: Conjunctivae normal.  Cardiovascular:     Rate and Rhythm: Normal rate and regular rhythm.     Pulses: Normal pulses.     Heart sounds: Normal heart sounds.  Pulmonary:     Effort: Pulmonary effort is normal. No respiratory distress.     Breath sounds: Normal breath sounds.  Abdominal:     General: Bowel sounds are normal. There is no distension.     Palpations: Abdomen is soft.     Tenderness: There is no abdominal tenderness.   Genitourinary:    Comments: Deferred with shared decision making. Self swab performed.  Neurological:     General: No focal deficit present.     Mental Status: She is alert and oriented to person, place, and time. Mental status  is at baseline.  Psychiatric:        Mood and Affect: Mood normal.        Behavior: Behavior normal.        Thought Content: Thought content normal.        Judgment: Judgment normal.      UC Treatments / Results  Labs (all labs ordered are listed, but only abnormal results are displayed) Labs Reviewed  POCT URINALYSIS DIP (MANUAL ENTRY) - Abnormal; Notable for the following components:      Result Value   Clarity, UA cloudy (*)    Bilirubin, UA small (*)    Ketones, POC UA trace (5) (*)    Spec Grav, UA >=1.030 (*)    Blood, UA large (*)    Protein Ur, POC >=300 (*)    Nitrite, UA Positive (*)    Leukocytes, UA Large (3+) (*)    All other components within normal limits  URINE CULTURE  CERVICOVAGINAL ANCILLARY ONLY    EKG   Radiology No results found.  Procedures Procedures (including critical care time)  Medications Ordered in UC Medications - No data to display  Initial Impression / Assessment and Plan / UC Course  I have reviewed the triage vital signs and the nursing notes.  Pertinent labs & imaging results that were available during my care of the patient were reviewed by me and considered in my medical decision making (see chart for details).     Urinalysis indicating urinary tract infection.  Will treat with cephalexin antibiotic.  Urine culture is pending.  Patient requesting cervicovaginal swab STD testing to ensure that she was not reinfected with trichomoniasis given that she is not sure if her sexual partner was treated and tested after recent exposure.  Given that patient is not having any symptoms and we do not have a confirmed exposure, will await cervicovaginal swab for any further treatment for this.  Patient to refrain  from sexual activity until test results and treatment are complete.  Discussed safe sex practices.  Patient advised to ensure that her sexual partner is treated and tested.  Patient verbalized understanding and was agreeable with plan. Final Clinical Impressions(s) / UC Diagnoses   Final diagnoses:  Acute cystitis with hematuria  Screening examination for venereal disease  Dysuria  Urinary frequency     Discharge Instructions      It appears that you have a urinary tract infection so you are being treated with an antibiotic.  Urine culture is pending.  Your vaginal swab to test for STDs is also pending.  Please refrain from sexual activity until test results and treatment are complete.  I recommend that you follow-up with urologist given that you are having frequent urinary tract infections as well.  Please see provided contact information.    ED Prescriptions     Medication Sig Dispense Auth. Provider   cephALEXin (KEFLEX) 500 MG capsule Take 1 capsule (500 mg total) by mouth 4 (four) times daily. 28 capsule [place], Acie Fredrickson, [place]      PDMP not reviewed this encounter.   Gustavus Bryant, [place] 10/25/21 1005

## 2021-10-27 LAB — CERVICOVAGINAL ANCILLARY ONLY
Bacterial Vaginitis (gardnerella): NEGATIVE
Candida Glabrata: NEGATIVE
Candida Vaginitis: NEGATIVE
Chlamydia: POSITIVE — AB
Comment: NEGATIVE
Comment: NEGATIVE
Comment: NEGATIVE
Comment: NEGATIVE
Comment: NEGATIVE
Comment: NORMAL
Neisseria Gonorrhea: NEGATIVE
Trichomonas: NEGATIVE

## 2021-10-27 LAB — URINE CULTURE: Culture: >=100000 — AB

## 2021-10-28 ENCOUNTER — Telehealth (HOSPITAL_COMMUNITY): Payer: Self-pay | Admitting: Emergency Medicine

## 2021-10-28 MED ORDER — DOXYCYCLINE HYCLATE 100 MG PO CAPS: 100.0000 mg | ORAL_CAPSULE | Freq: Two times a day (BID) | ORAL | 0 refills | Status: AC

## 2022-01-23 ENCOUNTER — Ambulatory Visit (HOSPITAL_COMMUNITY)
Admission: EM | Admit: 2022-01-23 | Discharge: 2022-01-23 | Disposition: A | Discharge status: Home / Self Care | Payer: Medicare Other | Attending: Internal Medicine | Admitting: Internal Medicine

## 2022-01-23 ENCOUNTER — Encounter (HOSPITAL_COMMUNITY): Payer: Self-pay

## 2022-01-23 DIAGNOSIS — G44209 Tension-type headache, unspecified, not intractable: Secondary | ICD-10-CM | POA: Diagnosis present

## 2022-01-23 DIAGNOSIS — R030 Elevated blood-pressure reading, without diagnosis of hypertension: Secondary | ICD-10-CM | POA: Diagnosis not present

## 2022-01-23 DIAGNOSIS — Z113 Encounter for screening for infections with a predominantly sexual mode of transmission: Secondary | ICD-10-CM | POA: Diagnosis not present

## 2022-01-23 MED ORDER — IBUPROFEN 800 MG PO TABS
ORAL_TABLET | ORAL | Status: AC
  Filled 2022-01-23: qty 1

## 2022-01-23 MED ORDER — NAPROXEN 500 MG PO TBEC: 500.0000 mg | DELAYED_RELEASE_TABLET | Freq: Two times a day (BID) | ORAL | 0 refills | Status: AC | PRN

## 2022-01-23 MED ORDER — IBUPROFEN 800 MG PO TABS
800.0000 mg | ORAL_TABLET | Freq: Once | ORAL | Status: AC
Start: 2022-01-23 — End: 2022-01-23
  Administered 2022-01-23: 800 mg via ORAL

## 2022-01-23 NOTE — ED Provider Notes (Signed)
[facility]    CSN: [id number] Arrival date & time: 01/23/22  1552      History   Chief Complaint Chief Complaint  Patient presents with   Headache   Exposure to STD    HPI Monique Gamble is a 31 y.o. female presents to urgent care today with complaints of headache and vaginal discharge.  Patient reports persistent headache x 3 days.  Headache described as tension and frontal area with some associated nausea.  She denies any dizziness, blurry vision or any weakness.  She reports history of frequent headaches usually relieved with naproxen.  She only has Tylenol at home which has not helped.  Patient reports history of high blood pressure that was treated during pregnancy but she has not been on medication for quite some time.  Patient also complaining of thick vaginal discharge with foul odor.  Requesting STI testing.  She denies any abnormal vaginal bleeding, pelvic or abdominal pain.    Past Medical History:  Diagnosis Date   ADHD (attention deficit hyperactivity disorder)    Chlamydia    Gestational diabetes    Headache    History of anemia    Hypertension    Pregnancy induced hypertension     Patient Active Problem List   Diagnosis Date Noted   Essential hypertension-postpartum 02/18/2018   Indication for care in labor or delivery 02/01/2018   Grand multipara 01/20/2018   GDM (gestational diabetes mellitus) 12/24/2017   Chronic hypertension during pregnancy, antepartum 12/03/2017   Anemia in pregnancy 11/04/2017   No prenatal care in current pregnancy 08/22/2016   H/O pre-eclampsia in prior pregnancy, currently pregnant 01/30/2015   Supervision of high-risk pregnancy 12/19/2014   Rubella non-immune status, antepartum 02/13/2014    Past Surgical History:  Procedure Laterality Date   INDUCED ABORTION     TUBAL LIGATION Bilateral 02/01/2018   Procedure: POST PARTUM TUBAL LIGATION;  Surgeon: Aletha Halim, MD;  Location: [facility];   Service: Gynecology;  Laterality: Bilateral;    OB History     Gravida  8   Para  6   Term  6   Preterm  0   AB  2   Living  6      SAB  1   IAB  1   Ectopic  0   Multiple  0   Live Births  6            Home Medications    Prior to Admission medications   Medication Sig Start Date End Date Taking? Authorizing Provider  naproxen (EC NAPROSYN) 500 MG EC tablet Take 1 tablet (500 mg total) by mouth 2 (two) times daily as needed. 01/23/22 01/23/23 Yes Rudolpho Sevin, NP  amLODipine (NORVASC) 10 MG tablet Take 1 tablet (10 mg total) by mouth daily. Patient not taking: Reported on 05/10/2019 02/03/18   Aura Camps, MD  butalbital-acetaminophen-caffeine (FIORICET, ESGIC) [phone] MG tablet Take 1-2 tablets by mouth every 6 (six) hours as needed for headache. Patient not taking: Reported on 05/10/2019 02/24/18   Gavin Pound, CNM  cephALEXin (KEFLEX) 500 MG capsule Take 1 capsule (500 mg total) by mouth 4 (four) times daily. 10/25/21   Teodora Medici, FNP  cyclobenzaprine (FLEXERIL) 10 MG tablet Take 10 mg by mouth 3 (three) times daily. 02/04/18   [provider]  lisinopril (PRINIVIL,ZESTRIL) 20 MG tablet Take 1 tablet (20 mg total) by mouth daily. Patient not taking: Reported on 05/10/2019 02/18/18   Laury Deep,  CNM  ondansetron (ZOFRAN ODT) 4 MG disintegrating tablet Take 1 tablet (4 mg total) by mouth every 8 (eight) hours as needed for nausea or vomiting. 02/29/20   Volney American, PA-C  phenazopyridine (PYRIDIUM) 200 MG tablet Take 1 tablet (200 mg total) by mouth 3 (three) times daily. 09/29/21   Nyoka Lint, PA-C  potassium chloride SA (K-DUR,KLOR-CON) 20 MEQ tablet Take 1 tablet (20 mEq total) by mouth daily. 03/16/18   Domenic Moras, PA-C  Prenatal Vit-Fe Fumarate-FA (PRENATAL MULTIVITAMIN) TABS tablet Take 1 tablet by mouth daily at 12 noon. Patient not taking: Reported on 03/16/2018 02/03/18   Aura Camps, MD  witch hazel-glycerin (TUCKS) pad  Apply 1 application topically as needed for hemorrhoids. Patient not taking: Reported on 03/16/2018 02/03/18   Aura Camps, MD    Family History Family History  Problem Relation Age of Onset   Hypertension Mother    Hypertension Father    Diabetes Sister    Anesthesia problems Neg Hx     Social History Social History   Tobacco Use   Smoking status: Never   Smokeless tobacco: Never  Vaping Use   Vaping Use: Never used  Substance Use Topics   Alcohol use: No   Drug use: Yes    Types: Marijuana    Comment: last use 03 Jul 2017     Allergies   Patient has no known allergies.   Review of Systems As stated in HPI otherwise negative   Physical Exam Triage Vital Signs ED Triage Vitals  Enc Vitals Group     BP 01/23/22 1703 (!) 166/79     Pulse Rate 01/23/22 1702 70     Resp 01/23/22 1703 18     Temp 01/23/22 1702 98.2 F (36.8 C)     Temp Source 01/23/22 1702 Oral     SpO2 01/23/22 1703 98 %     Weight --      Height --      Head Circumference --      Peak Flow --      Pain Score --      Pain Loc --      Pain Edu? --      Excl. in [place]? --    No data found.  Updated Vital Signs BP (!) 166/79 (BP Location: Left Arm)   Pulse 70   Temp 98.2 F (36.8 C) (Oral)   Resp 18   SpO2 98%   Visual Acuity Right Eye Distance:   Left Eye Distance:   Bilateral Distance:    Right Eye Near:   Left Eye Near:    Bilateral Near:     Physical Exam Constitutional:      General: She is not in acute distress.    Appearance: She is well-developed. She is not ill-appearing or toxic-appearing.  HENT:     Head: Normocephalic and atraumatic.  Eyes:     Extraocular Movements: Extraocular movements intact.     Pupils: Pupils are equal, round, and reactive to light.  Musculoskeletal:     Cervical back: Normal range of motion and neck supple. No rigidity.  Skin:    General: Skin is warm and dry.  Neurological:     Mental Status: She is alert.     Motor: No weakness.   Psychiatric:        Mood and Affect: Mood normal.        Behavior: Behavior normal.      UC Treatments / Results  Labs (  all labs ordered are listed, but only abnormal results are displayed) Labs Reviewed - No data to display  EKG   Radiology No results found.  Procedures Procedures (including critical care time)  Medications Ordered in UC Medications  ibuprofen (ADVIL) tablet 800 mg (has no administration in time range)    Initial Impression / Assessment and Plan / UC Course  I have reviewed the triage vital signs and the nursing notes.  Pertinent labs & imaging results that were available during my care of the patient were reviewed by me and considered in my medical decision making (see chart for details).  Headache, tension type -Patient with history of same.  VSS and nontoxic-appearing, neuroexam unremarkable -Ibuprofen in clinic today with prescription of naproxen to take as needed -Discussed symptoms that would necessitate emergent eval  Vaginal discharge -STI screening.  Will await testing results to determine if any treatment indicated -Patient advised to abstain from any sexual activity in the time being  Elevated blood pressure reading -Patient with history of same during pregnancy but has been off medication for several years -Denies any dizziness, chest pain or shortness of breath. -Requesting help to establish with PCP for monitoring and to determine need for treatment  Reviewed expections re: course of current medical issues. Questions answered. Outlined signs and symptoms indicating need for more acute intervention. Pt verbalized understanding. AVS given  Final Clinical Impressions(s) / UC Diagnoses   Final diagnoses:  Tension headache  Routine screening for STI (sexually transmitted infection)  Elevated blood pressure reading     Discharge Instructions      You have been given 800 mg of ibuprofen in the clinic today.  Please do not start  prescription naproxen until tomorrow if needed.  Return for any persistent or worsening headache.  Your STI testing results should be available in approximately 2 days.  You will receive a call should you need any treatment.  In the meantime I would abstain from any sexual activity.  As we discussed, your blood pressure is elevated at clinic today.  I am going to have the clinic reach out to you to help you to establish a primary care provider to have this closely monitored and to see if you might need medication.     ED Prescriptions     Medication Sig Dispense Auth. Provider   naproxen (EC NAPROSYN) 500 MG EC tablet Take 1 tablet (500 mg total) by mouth 2 (two) times daily as needed. 30 tablet Rolla Etienne, NP      PDMP not reviewed this encounter.   Rolla Etienne, NP 01/23/22 [phone]

## 2022-01-23 NOTE — ED Triage Notes (Signed)
Pt presents to office for headaches x 3 days. Pt is taking Tylenol with no relief. Pt is requesting STD testing, she reports vaginal discharge and itching.

## 2022-01-23 NOTE — Discharge Instructions (Signed)
You have been given 800 mg of ibuprofen in the clinic today.  Please do not start prescription naproxen until tomorrow if needed.  Return for any persistent or worsening headache.  Your STI testing results should be available in approximately 2 days.  You will receive a call should you need any treatment.  In the meantime I would abstain from any sexual activity.  As we discussed, your blood pressure is elevated at clinic today.  I am going to have the clinic reach out to you to help you to establish a primary care provider to have this closely monitored and to see if you might need medication.

## 2022-01-24 ENCOUNTER — Other Ambulatory Visit: Payer: Self-pay

## 2022-01-26 LAB — CERVICOVAGINAL ANCILLARY ONLY
Bacterial Vaginitis (gardnerella): POSITIVE — AB
Candida Glabrata: NEGATIVE
Candida Vaginitis: POSITIVE — AB
Chlamydia: NEGATIVE
Comment: NEGATIVE
Comment: NEGATIVE
Comment: NEGATIVE
Comment: NEGATIVE
Comment: NEGATIVE
Comment: NORMAL
Neisseria Gonorrhea: NEGATIVE
Trichomonas: POSITIVE — AB

## 2022-01-27 ENCOUNTER — Telehealth (HOSPITAL_COMMUNITY): Payer: Self-pay | Admitting: Emergency Medicine

## 2022-01-27 MED ORDER — FLUCONAZOLE 150 MG PO TABS: 150.0000 mg | ORAL_TABLET | Freq: Once | ORAL | 0 refills | Status: AC

## 2022-01-27 MED ORDER — METRONIDAZOLE 500 MG PO TABS: 500.0000 mg | ORAL_TABLET | Freq: Two times a day (BID) | ORAL | 0 refills | Status: DC

## 2022-02-19 ENCOUNTER — Other Ambulatory Visit: Payer: Self-pay

## 2022-02-19 ENCOUNTER — Emergency Department (HOSPITAL_COMMUNITY)
Admission: EM | Admit: 2022-02-19 | Discharge: 2022-02-19 | Disposition: A | Discharge status: Home / Self Care | Payer: Medicare Other | Attending: Emergency Medicine | Admitting: Emergency Medicine

## 2022-02-19 ENCOUNTER — Emergency Department (HOSPITAL_COMMUNITY): Payer: Medicare Other

## 2022-02-19 ENCOUNTER — Encounter (HOSPITAL_COMMUNITY): Payer: Self-pay | Admitting: Emergency Medicine

## 2022-02-19 DIAGNOSIS — N3000 Acute cystitis without hematuria: Secondary | ICD-10-CM | POA: Diagnosis not present

## 2022-02-19 DIAGNOSIS — I10 Essential (primary) hypertension: Secondary | ICD-10-CM | POA: Diagnosis not present

## 2022-02-19 DIAGNOSIS — Z79899 Other long term (current) drug therapy: Secondary | ICD-10-CM | POA: Diagnosis not present

## 2022-02-19 DIAGNOSIS — R1033 Periumbilical pain: Secondary | ICD-10-CM | POA: Diagnosis present

## 2022-02-19 LAB — I-STAT BETA HCG BLOOD, ED (MC, WL, AP ONLY): I-stat hCG, quantitative: <5 m[IU]/mL (ref <5)

## 2022-02-19 LAB — URINALYSIS, ROUTINE W REFLEX MICROSCOPIC
Bilirubin Urine: NEGATIVE
Glucose, UA: NEGATIVE mg/dL
Ketones, ur: NEGATIVE mg/dL
Nitrite: POSITIVE — AB
Protein, ur: 100 mg/dL — AB
Specific Gravity, Urine: 1.027 (ref 1.005–1.030)
WBC, UA: >50 WBC/hpf — ABNORMAL HIGH (ref 0–5)
pH: 5 (ref 5.0–8.0)

## 2022-02-19 LAB — COMPREHENSIVE METABOLIC PANEL
ALT: 9 U/L (ref 0–44)
AST: 16 U/L (ref 15–41)
Albumin: 3.5 g/dL (ref 3.5–5.0)
Alkaline Phosphatase: 56 U/L (ref 38–126)
Anion gap: 9 (ref 5–15)
BUN: 5 mg/dL — ABNORMAL LOW (ref 6–20)
CO2: 22 mmol/L (ref 22–32)
Calcium: 8.4 mg/dL — ABNORMAL LOW (ref 8.9–10.3)
Chloride: 105 mmol/L (ref 98–111)
Creatinine, Ser: 0.85 mg/dL (ref 0.44–1.00)
GFR, Estimated: >60 mL/min (ref >60)
Glucose, Bld: 108 mg/dL — ABNORMAL HIGH (ref 70–99)
Potassium: 3.2 mmol/L — ABNORMAL LOW (ref 3.5–5.1)
Sodium: 136 mmol/L (ref 135–145)
Total Bilirubin: 1.6 mg/dL — ABNORMAL HIGH (ref 0.3–1.2)
Total Protein: 6.8 g/dL (ref 6.5–8.1)

## 2022-02-19 LAB — CBC WITH DIFFERENTIAL/PLATELET
Abs Immature Granulocytes: 0.04 10*3/uL (ref 0.00–0.07)
Basophils Absolute: 0 10*3/uL (ref 0.0–0.1)
Basophils Relative: 0 %
Eosinophils Absolute: 0 10*3/uL (ref 0.0–0.5)
Eosinophils Relative: 0 %
HCT: 35.5 % — ABNORMAL LOW (ref 36.0–46.0)
Hemoglobin: 11.5 g/dL — ABNORMAL LOW (ref 12.0–15.0)
Immature Granulocytes: 0 %
Lymphocytes Relative: 5 %
Lymphs Abs: 0.6 10*3/uL — ABNORMAL LOW (ref 0.7–4.0)
MCH: 27.9 pg (ref 26.0–34.0)
MCHC: 32.4 g/dL (ref 30.0–36.0)
MCV: 86.2 fL (ref 80.0–100.0)
Monocytes Absolute: 0.6 10*3/uL (ref 0.1–1.0)
Monocytes Relative: 5 %
Neutro Abs: 9.8 10*3/uL — ABNORMAL HIGH (ref 1.7–7.7)
Neutrophils Relative %: 90 %
Platelets: 186 10*3/uL (ref 150–400)
RBC: 4.12 MIL/uL (ref 3.87–5.11)
RDW: 13.9 % (ref 11.5–15.5)
WBC: 11 10*3/uL — ABNORMAL HIGH (ref 4.0–10.5)
nRBC: 0 % (ref 0.0–0.2)

## 2022-02-19 LAB — TROPONIN I (HIGH SENSITIVITY)
Troponin I (High Sensitivity): 6 ng/L (ref <18)
Troponin I (High Sensitivity): 4 ng/L (ref <18)

## 2022-02-19 LAB — LIPASE, BLOOD: Lipase: 22 U/L (ref 11–51)

## 2022-02-19 MED ORDER — LISINOPRIL 20 MG PO TABS
20.0000 mg | ORAL_TABLET | Freq: Once | ORAL | Status: AC
  Administered 2022-02-19: 20 mg via ORAL
  Filled 2022-02-19: qty 1

## 2022-02-19 MED ORDER — LISINOPRIL 20 MG PO TABS: 20.0000 mg | ORAL_TABLET | Freq: Every day | ORAL | 2 refills | Status: AC

## 2022-02-19 MED ORDER — HYDROCODONE-ACETAMINOPHEN 5-325 MG PO TABS
1.0000 | ORAL_TABLET | Freq: Once | ORAL | Status: AC
  Administered 2022-02-19: 1 via ORAL
  Filled 2022-02-19: qty 1

## 2022-02-19 MED ORDER — ACETAMINOPHEN 325 MG PO TABS
650.0000 mg | ORAL_TABLET | Freq: Once | ORAL | Status: AC
  Administered 2022-02-19: 650 mg via ORAL
  Filled 2022-02-19: qty 2

## 2022-02-19 MED ORDER — SODIUM CHLORIDE 0.9 % IV SOLN
2.0000 g | Freq: Once | INTRAVENOUS | Status: AC
  Administered 2022-02-19: 2 g via INTRAVENOUS
  Filled 2022-02-19: qty 20

## 2022-02-19 MED ORDER — CEPHALEXIN 500 MG PO CAPS: 500.0000 mg | ORAL_CAPSULE | Freq: Four times a day (QID) | ORAL | 0 refills | Status: DC

## 2022-02-19 NOTE — ED Triage Notes (Signed)
Pt reports chest pain, abdominal pain, and N/V that started today. Pt received 4mg  zofran and 325mg  ASA en route. PT was febrile with Ems.

## 2022-02-19 NOTE — ED Notes (Signed)
No response for trop redraw 

## 2022-02-19 NOTE — Discharge Instructions (Signed)
Take Tylenol or Motrin for pain.  Follow-up with your family doctor next week for recheck

## 2022-02-19 NOTE — ED Notes (Signed)
Pt in chair, pt reports a slight decrease in pain

## 2022-02-19 NOTE — ED Provider Triage Note (Signed)
Emergency Medicine Provider Triage Evaluation Note  Monique Gamble , a 32 y.o. female  was evaluated in triage.  Pt complains of abdominal pain onset this morning.  Notes that she has abdominal cramping and she is on day 2 of her cycle.  Also notes that she has had substernal chest pain this morning.  Denies history of MI, cardiac catheterization, stents, diabetes.  Has a history of hypertension however no meds at this time.   Review of Systems  Positive:  Negative:   Physical Exam  There were no vitals taken for this visit. Gen:   Awake, no distress   Resp:  Normal effort  MSK:   Moves extremities without difficulty  Other:  Right-sided and sternal chest wall tenderness to palpation  Medical Decision Making  Medically screening exam initiated at 1:34 PM.  Appropriate orders placed.  Gwynneth Macleod was informed that the remainder of the evaluation will be completed by another provider, this initial triage assessment does not replace that evaluation, and the importance of remaining in the ED until their evaluation is complete.     Jeni Duling A, PA-C 02/19/22 1338

## 2022-02-19 NOTE — ED Notes (Signed)
Provider dr zammit made aware of patient's request for pain meds

## 2022-02-19 NOTE — ED Provider Notes (Signed)
[facility] EMERGENCY DEPARTMENT Provider Note   CSN: [id number] Arrival date & time: 02/19/22  1220     History  Chief Complaint  Patient presents with   Abdominal Pain    Monique Gamble is a 32 y.o. female.  Patient complains of lower abdominal pain.  Patient also needs more blood pressure medicine for hypertension  The history is provided by the patient and medical records. No language interpreter was used.  Abdominal Pain Pain location:  Periumbilical Pain quality: aching   Pain radiates to:  Does not radiate Pain severity:  Mild Onset quality:  Sudden Timing:  Constant Progression:  Waxing and waning Chronicity:  New Context: not alcohol use   Relieved by:  Nothing Worsened by:  Nothing Ineffective treatments:  None tried Associated symptoms: no chest pain, no cough, no diarrhea, no fatigue and no hematuria        Home Medications Prior to Admission medications   Medication Sig Start Date End Date Taking? Authorizing Provider  cephALEXin (KEFLEX) 500 MG capsule Take 1 capsule (500 mg total) by mouth 4 (four) times daily. 02/19/22  Yes Milton Ferguson, MD  lisinopril (ZESTRIL) 20 MG tablet Take 1 tablet (20 mg total) by mouth daily. 02/19/22  Yes Milton Ferguson, MD  amLODipine (NORVASC) 10 MG tablet Take 1 tablet (10 mg total) by mouth daily. Patient not taking: Reported on 05/10/2019 02/03/18   Aura Camps, MD  butalbital-acetaminophen-caffeine (FIORICET, ESGIC) [phone] MG tablet Take 1-2 tablets by mouth every 6 (six) hours as needed for headache. Patient not taking: Reported on 05/10/2019 02/24/18   Gavin Pound, CNM  cyclobenzaprine (FLEXERIL) 10 MG tablet Take 10 mg by mouth 3 (three) times daily. 02/04/18   [provider]  metroNIDAZOLE (FLAGYL) 500 MG tablet Take 1 tablet (500 mg total) by mouth 2 (two) times daily. 01/27/22   Chase Picket, MD  naproxen (EC NAPROSYN) 500 MG EC tablet Take 1 tablet (500 mg total) by mouth 2  (two) times daily as needed. 01/23/22 01/23/23  Rudolpho Sevin, NP  ondansetron (ZOFRAN ODT) 4 MG disintegrating tablet Take 1 tablet (4 mg total) by mouth every 8 (eight) hours as needed for nausea or vomiting. 02/29/20   Volney American, PA-C  phenazopyridine (PYRIDIUM) 200 MG tablet Take 1 tablet (200 mg total) by mouth 3 (three) times daily. 09/29/21   Nyoka Lint, PA-C  potassium chloride SA (K-DUR,KLOR-CON) 20 MEQ tablet Take 1 tablet (20 mEq total) by mouth daily. 03/16/18   Domenic Moras, PA-C  Prenatal Vit-Fe Fumarate-FA (PRENATAL MULTIVITAMIN) TABS tablet Take 1 tablet by mouth daily at 12 noon. Patient not taking: Reported on 03/16/2018 02/03/18   Aura Camps, MD  witch hazel-glycerin (TUCKS) pad Apply 1 application topically as needed for hemorrhoids. Patient not taking: Reported on 03/16/2018 02/03/18   Aura Camps, MD      Allergies    Patient has no known allergies.    Review of Systems   Review of Systems  Constitutional:  Negative for appetite change and fatigue.  HENT:  Negative for congestion, ear discharge and sinus pressure.   Eyes:  Negative for discharge.  Respiratory:  Negative for cough.   Cardiovascular:  Negative for chest pain.  Gastrointestinal:  Positive for abdominal pain. Negative for diarrhea.  Genitourinary:  Negative for frequency and hematuria.  Musculoskeletal:  Negative for back pain.  Skin:  Negative for rash.  Neurological:  Negative for seizures and headaches.  Psychiatric/Behavioral:  Negative for hallucinations.  Physical Exam Updated Vital Signs BP (!) 143/83   Pulse 82   Temp 98.3 F (36.8 C)   Resp 18   SpO2 100%  Physical Exam Vitals and nursing note reviewed.  Constitutional:      Appearance: She is well-developed.  HENT:     Head: Normocephalic.     Nose: Nose normal.  Eyes:     General: No scleral icterus.    Conjunctiva/sclera: Conjunctivae normal.  Neck:     Thyroid: No thyromegaly.  Cardiovascular:      Rate and Rhythm: Normal rate and regular rhythm.     Heart sounds: No murmur heard.    No friction rub. No gallop.  Pulmonary:     Breath sounds: No stridor. No wheezing or rales.  Chest:     Chest wall: No tenderness.  Abdominal:     General: There is no distension.     Tenderness: There is abdominal tenderness. There is no rebound.  Musculoskeletal:        General: Normal range of motion.     Cervical back: Neck supple.  Lymphadenopathy:     Cervical: No cervical adenopathy.  Skin:    Findings: No erythema or rash.  Neurological:     Mental Status: She is alert and oriented to person, place, and time.     Motor: No abnormal muscle tone.     Coordination: Coordination normal.  Psychiatric:        Behavior: Behavior normal.     ED Results / Procedures / Treatments   Labs (all labs ordered are listed, but only abnormal results are displayed) Labs Reviewed  CBC WITH DIFFERENTIAL/PLATELET - Abnormal; Notable for the following components:      Result Value   WBC 11.0 (*)    Hemoglobin 11.5 (*)    HCT 35.5 (*)    Neutro Abs 9.8 (*)    Lymphs Abs 0.6 (*)    All other components within normal limits  COMPREHENSIVE METABOLIC PANEL - Abnormal; Notable for the following components:   Potassium 3.2 (*)    Glucose, Bld 108 (*)    BUN 5 (*)    Calcium 8.4 (*)    Total Bilirubin 1.6 (*)    All other components within normal limits  URINALYSIS, ROUTINE W REFLEX MICROSCOPIC - Abnormal; Notable for the following components:   Color, Urine AMBER (*)    APPearance CLOUDY (*)    Hgb urine dipstick MODERATE (*)    Protein, ur 100 (*)    Nitrite POSITIVE (*)    Leukocytes,Ua LARGE (*)    WBC, UA >50 (*)    Bacteria, UA MANY (*)    All other components within normal limits  URINE CULTURE  LIPASE, BLOOD  I-STAT BETA HCG BLOOD, ED ([facility], [facility], [facility] ONLY)  TROPONIN I (HIGH SENSITIVITY)  TROPONIN I (HIGH SENSITIVITY)    EKG None  Radiology DG Chest 2 View  Result Date:  02/19/2022 CLINICAL DATA:  Chest pain on the right since this morning EXAM: CHEST - 2 VIEW COMPARISON:  None Available. FINDINGS: The heart size and mediastinal contours are within normal limits. Both lungs are clear. The visualized skeletal structures are unremarkable. IMPRESSION: No acute cardiopulmonary disease. Electronically Signed   By: Karen Kays M.D.   On: 02/19/2022 14:07    Procedures Procedures    Medications Ordered in ED Medications  HYDROcodone-acetaminophen (NORCO/VICODIN) 5-325 MG per tablet 1 tablet (has no administration in time range)  acetaminophen (TYLENOL) tablet 650 mg (  650 mg Oral Given 02/19/22 1542)  cefTRIAXone (ROCEPHIN) 2 g in sodium chloride 0.9 % 100 mL IVPB (0 g Intravenous Stopped 02/19/22 2005)  lisinopril (ZESTRIL) tablet 20 mg (20 mg Oral Given 02/19/22 1941)    ED Course/ Medical Decision Making/ A&P                           Medical Decision Making Amount and/or Complexity of Data Reviewed Labs: ordered.  Risk Prescription drug management.  This patient presents to the ED for concern of abdominal pain, this involves an extensive number of treatment options, and is a complaint that carries with it a high risk of complications and morbidity.  The differential diagnosis includes UTI appendicitis   Co morbidities that complicate the patient evaluation  Hypertension   Additional history obtained:  Additional history obtained from patient External records from outside source obtained and reviewed including hospital records   Lab Tests:  I Ordered, and personally interpreted labs.  The pertinent results include: Analysis consistent with UTI, white count 11   Imaging Studies ordered:  I ordered imaging studies including chest x-ray I independently visualized and interpreted imaging which showed negative I agree with the radiologist interpretation   Cardiac Monitoring: / EKG:  The patient was maintained on a cardiac monitor.  I personally  viewed and interpreted the cardiac monitored which showed an underlying rhythm of: Normal sinus rhythm   Consultations Obtained:  No consultant  Problem List / ED Course / Critical interventions / Medication management  Hypertension and UTI I ordered medication including lisinopril for blood pressure and Keflex for UTI Reevaluation of the patient after these medicines showed that the patient stayed the same I have reviewed the patients home medicines and have made adjustments as needed   Social Determinants of Health:  None   Test / Admission - Considered:  None  Patient with urinary tract infection.  She is sent home with Keflex and lisinopril for blood pressure        Final Clinical Impression(s) / ED Diagnoses Final diagnoses:  Acute cystitis without hematuria    Rx / DC Orders ED Discharge Orders          Ordered    lisinopril (ZESTRIL) 20 MG tablet  Daily        02/19/22 1905    cephALEXin (KEFLEX) 500 MG capsule  4 times daily        02/19/22 2130              Milton Ferguson, MD 02/21/22 1216

## 2022-03-19 ENCOUNTER — Ambulatory Visit: Payer: 59 | Admitting: Nurse Practitioner

## 2022-05-21 ENCOUNTER — Ambulatory Visit: Payer: 59 | Admitting: Nurse Practitioner

## 2022-06-11 ENCOUNTER — Encounter (HOSPITAL_COMMUNITY): Payer: Self-pay

## 2022-06-11 ENCOUNTER — Ambulatory Visit (HOSPITAL_COMMUNITY)
Admission: EM | Admit: 2022-06-11 | Discharge: 2022-06-11 | Disposition: A | Discharge status: Home / Self Care | Payer: 59 | Attending: Nurse Practitioner | Admitting: Nurse Practitioner

## 2022-06-11 DIAGNOSIS — Z202 Contact with and (suspected) exposure to infections with a predominantly sexual mode of transmission: Secondary | ICD-10-CM

## 2022-06-11 DIAGNOSIS — Z113 Encounter for screening for infections with a predominantly sexual mode of transmission: Secondary | ICD-10-CM | POA: Diagnosis present

## 2022-06-11 DIAGNOSIS — R300 Dysuria: Secondary | ICD-10-CM | POA: Diagnosis present

## 2022-06-11 LAB — POCT URINALYSIS DIP (MANUAL ENTRY)
Blood, UA: NEGATIVE
Glucose, UA: NEGATIVE mg/dL
Leukocytes, UA: NEGATIVE
Nitrite, UA: NEGATIVE
Protein Ur, POC: 30 mg/dL — AB
Spec Grav, UA: 1.025 (ref 1.010–1.025)
Urobilinogen, UA: 0.2 E.U./dL
pH, UA: 6.5 (ref 5.0–8.0)

## 2022-06-11 LAB — POCT URINE PREGNANCY: Preg Test, Ur: NEGATIVE

## 2022-06-11 MED ORDER — LIDOCAINE HCL (PF) 1 % IJ SOLN
INTRAMUSCULAR | Status: AC
  Filled 2022-06-11: qty 2

## 2022-06-11 MED ORDER — CEFTRIAXONE SODIUM 500 MG IJ SOLR
INTRAMUSCULAR | Status: AC
  Filled 2022-06-11: qty 500

## 2022-06-11 MED ORDER — DOXYCYCLINE HYCLATE 100 MG PO CAPS: 100.0000 mg | ORAL_CAPSULE | Freq: Two times a day (BID) | ORAL | 0 refills | Status: DC

## 2022-06-11 MED ORDER — CEFTRIAXONE SODIUM 500 MG IJ SOLR
500.0000 mg | Freq: Once | INTRAMUSCULAR | Status: AC
  Administered 2022-06-11: 500 mg via INTRAMUSCULAR

## 2022-06-11 MED ORDER — PHENAZOPYRIDINE HCL 200 MG PO TABS: 200.0000 mg | ORAL_TABLET | Freq: Three times a day (TID) | ORAL | 0 refills | Status: AC

## 2022-06-11 NOTE — ED Triage Notes (Signed)
Pt presents with c/o dysuria X 3 days.   Pt states her recent partner told her he tested positive for Gonorrhea.

## 2022-06-11 NOTE — ED Provider Notes (Signed)
[facility]    CSN: [id number] Arrival date & time: 06/11/22  1534      History   Chief Complaint Chief Complaint  Patient presents with   Dysuria    HPI Monique Gamble is a 32 y.o. female.   Subjective:   Monique Gamble is a 32 y.o. female who presents with dysuria, odor and discharge for the past 3 days. The vaginal discharge is clear and white.  She denies any urinary frequency, vaginal itching/irritation, nausea, vomiting, fever, low back pain or vaginal sores.  Notably, the patient also reports that her partner recently informed her that he has tested positive for chlamydia.  Her partner is currently taking medications for this.  Patient does have a prior history of chlamydia and trichomonas as well. Her last menstrual period was about 2 weeks ago.  She is not on birth control and does not use condoms.   The following portions of the patient's history were reviewed and updated as appropriate: allergies, current medications, past family history, past medical history, past social history, past surgical history, and problem list.          Past Medical History:  Diagnosis Date   ADHD (attention deficit hyperactivity disorder)    Chlamydia    Gestational diabetes    Headache    History of anemia    Hypertension    Pregnancy induced hypertension     Patient Active Problem List   Diagnosis Date Noted   Essential hypertension-postpartum 02/18/2018   Indication for care in labor or delivery 02/01/2018   Grand multipara 01/20/2018   GDM (gestational diabetes mellitus) 12/24/2017   Chronic hypertension during pregnancy, antepartum 12/03/2017   Anemia in pregnancy 11/04/2017   No prenatal care in current pregnancy 08/22/2016   H/O pre-eclampsia in prior pregnancy, currently pregnant 01/30/2015   Supervision of high-risk pregnancy 12/19/2014   Rubella non-immune status, antepartum 02/13/2014    Past Surgical History:  Procedure Laterality Date    INDUCED ABORTION     TUBAL LIGATION Bilateral 02/01/2018   Procedure: POST PARTUM TUBAL LIGATION;  Surgeon: McLoud Bing, MD;  Location: [facility] BIR[facility] SUIT[facility];  Service: Gynecology;  Laterality: Bilateral;    OB History     Gravida  8   Para  6   Term  6   Preterm  0   AB  2   Living  6      SAB  1   IAB  1   Ectopic  0   Multiple  0   Live Births  6            Home Medications    Prior to Admission medications   Medication Sig Start Date End Date Taking? Authorizing Provider  doxycycline (VIBRAMYCIN) 100 MG capsule Take 1 capsule (100 mg total) by mouth 2 (two) times daily. 06/11/22  Yes Lurline Idol, FNP  phenazopyridine (PYRIDIUM) 200 MG tablet Take 1 tablet (200 mg total) by mouth 3 (three) times daily. 06/11/22  Yes Lurline Idol, FNP  amLODipine (NORVASC) 10 MG tablet Take 1 tablet (10 mg total) by mouth daily. Patient not taking: Reported on 05/10/2019 02/03/18   Gwenevere Abbot, MD  butalbital-acetaminophen-caffeine (FIORICET, ESGIC) [id number] MG tablet Take 1-2 tablets by mouth every 6 (six) hours as needed for headache. Patient not taking: Reported on 05/10/2019 02/24/18   Gerrit Heck, CNM  cephALEXin (KEFLEX) 500 MG capsule Take 1 capsule (500 mg total) by mouth 4 (four) times daily. 02/19/22   Zammit,  Jomarie Longs, MD  cyclobenzaprine (FLEXERIL) 10 MG tablet Take 10 mg by mouth 3 (three) times daily. 02/04/18   [provider]  lisinopril (ZESTRIL) 20 MG tablet Take 1 tablet (20 mg total) by mouth daily. 02/19/22   Bethann Berkshire, MD  naproxen (EC NAPROSYN) 500 MG EC tablet Take 1 tablet (500 mg total) by mouth 2 (two) times daily as needed. 01/23/22 01/23/23  Rolla Etienne, NP  ondansetron (ZOFRAN ODT) 4 MG disintegrating tablet Take 1 tablet (4 mg total) by mouth every 8 (eight) hours as needed for nausea or vomiting. 02/29/20   Particia Nearing, PA-C  potassium chloride SA (K-DUR,KLOR-CON) 20 MEQ tablet Take 1 tablet (20 mEq total) by mouth  daily. 03/16/18   Fayrene Helper, PA-C  Prenatal Vit-Fe Fumarate-FA (PRENATAL MULTIVITAMIN) TABS tablet Take 1 tablet by mouth daily at 12 noon. Patient not taking: Reported on 03/16/2018 02/03/18   Gwenevere Abbot, MD  witch hazel-glycerin (TUCKS) pad Apply 1 application topically as needed for hemorrhoids. Patient not taking: Reported on 03/16/2018 02/03/18   Gwenevere Abbot, MD    Family History Family History  Problem Relation Age of Onset   Hypertension Mother    Hypertension Father    Diabetes Sister    Anesthesia problems Neg Hx     Social History Social History   Tobacco Use   Smoking status: Never   Smokeless tobacco: Never  Vaping Use   Vaping Use: Never used  Substance Use Topics   Alcohol use: No   Drug use: Yes    Types: Marijuana    Comment: last use 03 Jul 2017     Allergies   Patient has no known allergies.   Review of Systems Review of Systems  Constitutional:  Negative for fever.  Gastrointestinal:  Negative for abdominal pain, nausea and vomiting.  Genitourinary:  Positive for dysuria and vaginal discharge. Negative for flank pain and genital sores.  Musculoskeletal:  Negative for back pain.  All other systems reviewed and are negative.    Physical Exam Triage Vital Signs ED Triage Vitals [06/11/22 1557]  Enc Vitals Group     BP (!) 153/96     Pulse Rate 67     Resp 16     Temp 98.6 F (37 C)     Temp src      SpO2 98 %     Weight      Height      Head Circumference      Peak Flow      Pain Score 0     Pain Loc      Pain Edu?      Excl. in GC?    No data found.  Updated Vital Signs BP (!) 153/96 (BP Location: Right Arm)   Pulse 67   Temp 98.6 F (37 C)   Resp 16   LMP 05/24/2022 (Exact Date)   SpO2 98%   Visual Acuity Right Eye Distance:   Left Eye Distance:   Bilateral Distance:    Right Eye Near:   Left Eye Near:    Bilateral Near:     Physical Exam Vitals reviewed.  Constitutional:      Appearance: Normal  appearance.  HENT:     Head: Normocephalic.  Cardiovascular:     Rate and Rhythm: Normal rate.  Pulmonary:     Effort: Pulmonary effort is normal.  Abdominal:     Palpations: Abdomen is soft.  Genitourinary:    Comments: Deferred; patient performed  self swab for testing  Musculoskeletal:        General: Normal range of motion.     Cervical back: Normal range of motion and neck supple.  Skin:    General: Skin is warm and dry.  Neurological:     General: No focal deficit present.     Mental Status: She is alert and oriented to person, place, and time.      UC Treatments / Results  Labs (all labs ordered are listed, but only abnormal results are displayed) Labs Reviewed  POCT URINALYSIS DIP (MANUAL ENTRY) - Abnormal; Notable for the following components:      Result Value   Bilirubin, UA small (*)    Ketones, POC UA small (15) (*)    Protein Ur, POC =30 (*)    All other components within normal limits  URINE CULTURE  POCT URINE PREGNANCY  CERVICOVAGINAL ANCILLARY ONLY    EKG   Radiology No results found.  Procedures Procedures (including critical care time)  Medications Ordered in UC Medications  cefTRIAXone (ROCEPHIN) injection 500 mg (500 mg Intramuscular Given 06/11/22 1735)    Initial Impression / Assessment and Plan / UC Course  I have reviewed the triage vital signs and the nursing notes.  Pertinent labs & imaging results that were available during my care of the patient were reviewed by me and considered in my medical decision making (see chart for details).    32 year old female presenting with dysuria, odor, vaginal discharge and STI exposure.  Urine pregnancy negative.  Urinalysis unremarkable.  GU exam deferred.  Patient perform self swab for testing. Urine culture pending. Will empirically treat for gonorrhea and chlamydia given exposure.  Patient advised to abstain from sexual activity until pending tests have resulted, all treatment is completed  with herself and her partner and symptoms have resolved. Safe sex practices highly encouraged.  Today's evaluation has revealed no signs of a dangerous process. Discussed diagnosis with patient and/or guardian. Patient and/or guardian aware of their diagnosis, possible red flag symptoms to watch out for and need for close follow up. Patient and/or guardian understands verbal and written discharge instructions. Patient and/or guardian comfortable with plan and disposition.  Patient and/or guardian has a clear mental status at this time, good insight into illness (after discussion and teaching) and has clear judgment to make decisions regarding their care  Documentation was completed with the aid of voice recognition software. Transcription may contain typographical errors. Final Clinical Impressions(s) / UC Diagnoses   Final diagnoses:  Dysuria  Screening examination for STD (sexually transmitted disease)  STD exposure     Discharge Instructions      You were seen today because of your known exposure to a STD. You will be treated today for gonorrhea and chlamydia. It is important that you and your partner finish all of the medications before engaging in any sexual activity. Testing for bacteria, yeast, gonorrhea, chlamydia and trichomonas is pending. You will only be notified for positive results. You may go online to MyChart and review your results. Practice safe sex practices by wearing a condom every time you have sex. Remember that people who have STIs may not experience any symptoms. However, even without symptoms, these infections can be spread from person to person and require treatment. STIs can be treated, and many STIs can be cured. However, some STIs cannot be cured and will affect you for the rest of your life. It's important to be checked regularly for STIs. You should  also consider taking pre-exposure prophylaxis (PrEP) to prevent HIV infection.     ED Prescriptions      Medication Sig Dispense Auth. Provider   doxycycline (VIBRAMYCIN) 100 MG capsule Take 1 capsule (100 mg total) by mouth 2 (two) times daily. 14 capsule Lurline Idol, FNP   phenazopyridine (PYRIDIUM) 200 MG tablet Take 1 tablet (200 mg total) by mouth 3 (three) times daily. 6 tablet Lurline Idol, FNP      PDMP not reviewed this encounter.   Lurline Idol, [place] 06/11/22 1750

## 2022-06-11 NOTE — Discharge Instructions (Addendum)
You were seen today because of your known exposure to a STD. You will be treated today for gonorrhea and chlamydia. It is important that you and your partner finish all of the medications before engaging in any sexual activity. Testing for bacteria, yeast, gonorrhea, chlamydia and trichomonas is pending. You will only be notified for positive results. You may go online to MyChart and review your results. Practice safe sex practices by wearing a condom every time you have sex. Remember that people who have STIs may not experience any symptoms. However, even without symptoms, these infections can be spread from person to person and require treatment. STIs can be treated, and many STIs can be cured. However, some STIs cannot be cured and will affect you for the rest of your life. It's important to be checked regularly for STIs. You should also consider taking pre-exposure prophylaxis (PrEP) to prevent HIV infection.

## 2022-06-12 ENCOUNTER — Telehealth: Payer: Self-pay | Admitting: Emergency Medicine

## 2022-06-12 LAB — URINE CULTURE
Culture: NO GROWTH
Special Requests: NORMAL

## 2022-06-12 LAB — CERVICOVAGINAL ANCILLARY ONLY
Bacterial Vaginitis (gardnerella): POSITIVE — AB
Candida Glabrata: POSITIVE — AB
Candida Vaginitis: POSITIVE — AB
Chlamydia: POSITIVE — AB
Comment: NEGATIVE
Comment: NEGATIVE
Comment: NEGATIVE
Comment: NEGATIVE
Comment: NEGATIVE
Comment: NORMAL
Neisseria Gonorrhea: NEGATIVE
Trichomonas: NEGATIVE

## 2022-06-12 MED ORDER — METRONIDAZOLE 0.75 % VA GEL: 1.0000 | Freq: Every day | VAGINAL | 0 refills | Status: AC

## 2022-06-12 MED ORDER — FLUCONAZOLE 150 MG PO TABS: 150.0000 mg | ORAL_TABLET | Freq: Once | ORAL | 0 refills | Status: AC

## 2022-07-08 ENCOUNTER — Ambulatory Visit: Payer: 59 | Admitting: Nurse Practitioner

## 2023-01-04 ENCOUNTER — Ambulatory Visit (HOSPITAL_COMMUNITY): Payer: 59

## 2023-01-05 ENCOUNTER — Ambulatory Visit (HOSPITAL_COMMUNITY): Payer: 59

## 2023-01-06 ENCOUNTER — Encounter (HOSPITAL_COMMUNITY): Payer: Self-pay | Admitting: Emergency Medicine

## 2023-01-06 ENCOUNTER — Other Ambulatory Visit: Payer: Self-pay

## 2023-01-06 ENCOUNTER — Ambulatory Visit (HOSPITAL_COMMUNITY)
Admission: EM | Admit: 2023-01-06 | Discharge: 2023-01-06 | Disposition: A | Discharge status: Home / Self Care | Payer: 59 | Attending: Family Medicine | Admitting: Family Medicine

## 2023-01-06 DIAGNOSIS — Z3202 Encounter for pregnancy test, result negative: Secondary | ICD-10-CM

## 2023-01-06 DIAGNOSIS — Z202 Contact with and (suspected) exposure to infections with a predominantly sexual mode of transmission: Secondary | ICD-10-CM | POA: Diagnosis present

## 2023-01-06 LAB — POCT URINALYSIS DIP (MANUAL ENTRY)
Blood, UA: NEGATIVE
Glucose, UA: NEGATIVE mg/dL
Leukocytes, UA: NEGATIVE
Nitrite, UA: NEGATIVE
Protein Ur, POC: 30 mg/dL — AB
Spec Grav, UA: >=1.03 — AB (ref 1.010–1.025)
Urobilinogen, UA: 0.2 U/dL
pH, UA: 6 (ref 5.0–8.0)

## 2023-01-06 LAB — POCT URINE PREGNANCY: Preg Test, Ur: NEGATIVE

## 2023-01-06 MED ORDER — METRONIDAZOLE 500 MG PO TABS
ORAL_TABLET | ORAL | Status: AC
  Filled 2023-01-06: qty 4

## 2023-01-06 MED ORDER — METRONIDAZOLE 500 MG PO TABS
2000.0000 mg | ORAL_TABLET | Freq: Once | ORAL | Status: AC
  Administered 2023-01-06: 2000 mg via ORAL

## 2023-01-06 NOTE — ED Provider Notes (Signed)
[facility]   [id number] 01/06/23 Arrival Time: 1308  ASSESSMENT & PLAN:  1. Exposure to STD       Discharge Instructions      You have been given the following today for treatment of suspected trichomonas:  Meds ordered this encounter  Medications   metroNIDAZOLE (FLAGYL) tablet 2,000 mg     Even though we have treated you today, we have sent testing for sexually transmitted infections. We will notify you of any positive results once they are received. If required, we will prescribe any medications you might need.  Please refrain from all sexual activity for at least the next seven days.     Without s/s of PID.  Labs Reviewed  POCT URINALYSIS DIP (MANUAL ENTRY) - Abnormal; Notable for the following components:      Result Value   Color, UA orange (*)    Clarity, UA cloudy (*)    Bilirubin, UA small (*)    Ketones, POC UA trace (5) (*)    Spec Grav, UA >=1.030 (*)    Protein Ur, POC =30 (*)    All other components within normal limits  POCT URINE PREGNANCY  CERVICOVAGINAL ANCILLARY ONLY    Pending: Unresulted Labs (From admission, onward)    None        Will notify of any positive results. Instructed to refrain from sexual activity for at least seven days.  Reviewed expectations re: course of current medical issues. Questions answered. Outlined signs and symptoms indicating need for more acute intervention. Patient verbalized understanding. After Visit Summary given.   SUBJECTIVE:  Monique Gamble is a 32 y.o. female who presents with complaint of exp to trich; partner is +. She reports no symptoms/vag discharge. Also mild stinging with urination; noted today. Denies fever. Patient reports partner notified her he has trich.  Patient denies symptoms No tx PTA.  Patient's last menstrual period was 12/23/2022.   OBJECTIVE:  Vitals:   01/06/23 1011 01/06/23 1013  BP: (!) 142/91 123/84  Pulse: 80   Resp: 18   Temp: 97.9 F (36.6  C)   TempSrc: Oral   SpO2: 98%      General appearance: alert, cooperative, appears stated age and no distress GU: deferred Skin: dry Psychological: alert and cooperative; normal mood and affect.  Results for orders placed or performed during the hospital encounter of 01/06/23  POCT urinalysis dipstick  Result Value Ref Range   Color, UA orange (A) yellow   Clarity, UA cloudy (A) clear   Glucose, UA negative negative mg/dL   Bilirubin, UA small (A) negative   Ketones, POC UA trace (5) (A) negative mg/dL   Spec Grav, UA >=6.578 (A) 1.010 - 1.025   Blood, UA negative negative   pH, UA 6.0 5.0 - 8.0   Protein Ur, POC =30 (A) negative mg/dL   Urobilinogen, UA 0.2 0.2 or 1.0 E.U./dL   Nitrite, UA Negative Negative   Leukocytes, UA Negative Negative  POCT urine pregnancy  Result Value Ref Range   Preg Test, Ur Negative Negative    Labs Reviewed  POCT URINALYSIS DIP (MANUAL ENTRY) - Abnormal; Notable for the following components:      Result Value   Color, UA orange (*)    Clarity, UA cloudy (*)    Bilirubin, UA small (*)    Ketones, POC UA trace (5) (*)    Spec Grav, UA >=1.030 (*)    Protein Ur, POC =30 (*)    All  other components within normal limits  POCT URINE PREGNANCY  CERVICOVAGINAL ANCILLARY ONLY    No Known Allergies  Past Medical History:  Diagnosis Date   ADHD (attention deficit hyperactivity disorder)    Chlamydia    Gestational diabetes    Headache    History of anemia    Hypertension    Pregnancy induced hypertension    Family History  Problem Relation Age of Onset   Hypertension Mother    Hypertension Father    Diabetes Sister    Anesthesia problems Neg Hx    Social History   Socioeconomic History   Marital status: Single    Spouse name: Not on file   Number of children: Not on file   Years of education: Not on file   Highest education level: Not on file  Occupational History   Not on file  Tobacco Use   Smoking status: Never    Smokeless tobacco: Never  Vaping Use   Vaping status: Never Used  Substance and Sexual Activity   Alcohol use: No   Drug use: Yes    Types: Marijuana    Comment: last use 03 Jul 2017   Sexual activity: Yes    Birth control/protection: None  Other Topics Concern   Not on file  Social History Narrative   Not on file   Social Determinants of Health   Financial Resource Strain: Not on file  Food Insecurity: Not on file  Transportation Needs: Not on file  Physical Activity: Not on file  Stress: Not on file  Social Connections: Not on file  Intimate Partner Violence: Not on file           Homer C Jones, MD 01/06/23 1034

## 2023-01-06 NOTE — Discharge Instructions (Signed)
You have been given the following today for treatment of suspected trichomonas:  Meds ordered this encounter  Medications   metroNIDAZOLE (FLAGYL) tablet 2,000 mg     Even though we have treated you today, we have sent testing for sexually transmitted infections. We will notify you of any positive results once they are received. If required, we will prescribe any medications you might need.  Please refrain from all sexual activity for at least the next seven days.

## 2023-01-06 NOTE — ED Triage Notes (Signed)
Patient reports partner notified her he has trich.  Patient denies symptoms

## 2023-01-06 NOTE — ED Triage Notes (Signed)
Stinging with urination  Patient requesting all testing and treatment

## 2023-01-07 LAB — CERVICOVAGINAL ANCILLARY ONLY
Chlamydia: NEGATIVE
Comment: NEGATIVE
Comment: NEGATIVE
Comment: NORMAL
Neisseria Gonorrhea: NEGATIVE
Trichomonas: NEGATIVE

## 2023-01-11 ENCOUNTER — Other Ambulatory Visit: Payer: Self-pay | Admitting: Internal Medicine

## 2023-07-17 ENCOUNTER — Emergency Department (HOSPITAL_COMMUNITY)
Admission: EM | Admit: 2023-07-17 | Discharge: 2023-07-17 | Disposition: A | Discharge status: Home / Self Care | Attending: Emergency Medicine | Admitting: Emergency Medicine

## 2023-07-17 ENCOUNTER — Other Ambulatory Visit: Payer: Self-pay

## 2023-07-17 ENCOUNTER — Encounter (HOSPITAL_COMMUNITY): Payer: Self-pay

## 2023-07-17 ENCOUNTER — Emergency Department (HOSPITAL_COMMUNITY)

## 2023-07-17 DIAGNOSIS — N939 Abnormal uterine and vaginal bleeding, unspecified: Secondary | ICD-10-CM | POA: Insufficient documentation

## 2023-07-17 DIAGNOSIS — R1084 Generalized abdominal pain: Secondary | ICD-10-CM | POA: Diagnosis present

## 2023-07-17 LAB — CBC
HCT: 37.6 % (ref 36.0–46.0)
Hemoglobin: 12 g/dL (ref 12.0–15.0)
MCH: 28.6 pg (ref 26.0–34.0)
MCHC: 31.9 g/dL (ref 30.0–36.0)
MCV: 89.5 fL (ref 80.0–100.0)
Platelets: 220 10*3/uL (ref 150–400)
RBC: 4.2 MIL/uL (ref 3.87–5.11)
RDW: 13.9 % (ref 11.5–15.5)
WBC: 4.3 10*3/uL (ref 4.0–10.5)
nRBC: 0 % (ref 0.0–0.2)

## 2023-07-17 LAB — COMPREHENSIVE METABOLIC PANEL WITH GFR
ALT: 9 U/L (ref 0–44)
AST: 16 U/L (ref 15–41)
Albumin: 3.7 g/dL (ref 3.5–5.0)
Alkaline Phosphatase: 50 U/L (ref 38–126)
Anion gap: 9 (ref 5–15)
BUN: 5 mg/dL — ABNORMAL LOW (ref 6–20)
CO2: 25 mmol/L (ref 22–32)
Calcium: 9.1 mg/dL (ref 8.9–10.3)
Chloride: 105 mmol/L (ref 98–111)
Creatinine, Ser: 0.79 mg/dL (ref 0.44–1.00)
GFR, Estimated: >60 mL/min (ref >60)
Glucose, Bld: 97 mg/dL (ref 70–99)
Potassium: 3.7 mmol/L (ref 3.5–5.1)
Sodium: 139 mmol/L (ref 135–145)
Total Bilirubin: 1.3 mg/dL — ABNORMAL HIGH (ref 0.0–1.2)
Total Protein: 7.5 g/dL (ref 6.5–8.1)

## 2023-07-17 LAB — URINALYSIS, W/ REFLEX TO CULTURE (INFECTION SUSPECTED)
Bilirubin Urine: NEGATIVE
Glucose, UA: NEGATIVE mg/dL
Ketones, ur: 5 mg/dL — AB
Leukocytes,Ua: NEGATIVE
Nitrite: NEGATIVE
Protein, ur: NEGATIVE mg/dL
Specific Gravity, Urine: 1.014 (ref 1.005–1.030)
pH: 6 (ref 5.0–8.0)

## 2023-07-17 LAB — WET PREP, GENITAL
Clue Cells Wet Prep HPF POC: NONE SEEN
Sperm: NONE SEEN
Trich, Wet Prep: NONE SEEN
WBC, Wet Prep HPF POC: <10 (ref <10)
Yeast Wet Prep HPF POC: NONE SEEN

## 2023-07-17 LAB — HIV ANTIBODY (ROUTINE TESTING W REFLEX): HIV Screen 4th Generation wRfx: NONREACTIVE

## 2023-07-17 LAB — HCG, SERUM, QUALITATIVE: Preg, Serum: NEGATIVE

## 2023-07-17 MED ORDER — AZITHROMYCIN 250 MG PO TABS
1000.0000 mg | ORAL_TABLET | Freq: Once | ORAL | Status: AC
  Administered 2023-07-17: 1000 mg via ORAL
  Filled 2023-07-17: qty 4

## 2023-07-17 MED ORDER — DOXYCYCLINE HYCLATE 100 MG PO TABS: 100.0000 mg | ORAL_TABLET | Freq: Two times a day (BID) | ORAL | 0 refills | Status: AC

## 2023-07-17 MED ORDER — IBUPROFEN 400 MG PO TABS
600.0000 mg | ORAL_TABLET | Freq: Once | ORAL | Status: AC
  Administered 2023-07-17: 600 mg via ORAL
  Filled 2023-07-17: qty 1

## 2023-07-17 MED ORDER — ACETAMINOPHEN 325 MG PO TABS
975.0000 mg | ORAL_TABLET | Freq: Once | ORAL | Status: AC
  Administered 2023-07-17: 975 mg via ORAL
  Filled 2023-07-17: qty 3

## 2023-07-17 MED ORDER — METRONIDAZOLE 500 MG PO TABS
2000.0000 mg | ORAL_TABLET | Freq: Once | ORAL | Status: AC
  Administered 2023-07-17: 2000 mg via ORAL
  Filled 2023-07-17: qty 4

## 2023-07-17 MED ORDER — CEFTRIAXONE SODIUM 500 MG IJ SOLR
500.0000 mg | Freq: Once | INTRAMUSCULAR | Status: AC
  Administered 2023-07-17: 500 mg via INTRAMUSCULAR
  Filled 2023-07-17: qty 500

## 2023-07-17 MED ORDER — LIDOCAINE HCL (PF) 1 % IJ SOLN
1.0000 mL | Freq: Once | INTRAMUSCULAR | Status: AC
  Administered 2023-07-17: 1.2 mL
  Filled 2023-07-17: qty 5

## 2023-07-17 NOTE — ED Provider Notes (Signed)
 [place] [facility] AT [facility] Provider Note   CSN: [id number] Arrival date & time: 07/17/23  1306     History  Chief Complaint  Patient presents with   Vaginal Bleeding    Monique Gamble is a 33 y.o. female.  With gynecologic history G8, P6 A2 presents to the ED for vaginal bleeding.  Patient's last menstrual period ended on May 9.  Beginning yesterday she began to have abdominal cramping and passed 1 large blood clot.  Since that time she has experienced continual abdominal cramping bilateral lower abdomen and spotting.  No dysuria vaginal discharge fevers chills.  She is sexually active with 1 partner does not use barrier contraception.  No prior history of dysfunctional uterine bleeding, endometriosis or   Vaginal Bleeding      Home Medications Prior to Admission medications   Medication Sig Start Date End Date Taking? Authorizing Provider  doxycycline  (VIBRA -TABS) 100 MG tablet Take 1 tablet (100 mg total) by mouth 2 (two) times daily for 7 days. 07/17/23 07/24/23 Yes Sallyanne Creamer, DO  cyclobenzaprine  (FLEXERIL ) 10 MG tablet Take 10 mg by mouth 3 (three) times daily. 02/04/18   [provider]  lisinopril  (ZESTRIL ) 20 MG tablet Take 1 tablet (20 mg total) by mouth daily. 02/19/22   Zammit, Joseph, MD  ondansetron  (ZOFRAN  ODT) 4 MG disintegrating tablet Take 1 tablet (4 mg total) by mouth every 8 (eight) hours as needed for nausea or vomiting. 02/29/20   Corbin Dess, PA-C  phenazopyridine  (PYRIDIUM ) 200 MG tablet Take 1 tablet (200 mg total) by mouth 3 (three) times daily. 06/11/22   Maryruth Sol, FNP  potassium chloride  SA (K-DUR,KLOR-CON ) 20 MEQ tablet Take 1 tablet (20 mEq total) by mouth daily. 03/16/18   Debbra Fairy, PA-C      Allergies    Patient has no known allergies.    Review of Systems   Review of Systems  Genitourinary:  Positive for vaginal bleeding.    Physical Exam Updated Vital Signs BP (!) 158/94    Pulse 82   Temp 98.6 F (37 C) (Oral)   Resp (!) 116   LMP 05/23/2023 (Approximate)   SpO2 100%  Physical Exam Vitals and nursing note reviewed.  HENT:     Head: Normocephalic and atraumatic.  Eyes:     Pupils: Pupils are equal, round, and reactive to light.  Cardiovascular:     Rate and Rhythm: Normal rate and regular rhythm.  Pulmonary:     Effort: Pulmonary effort is normal.     Breath sounds: Normal breath sounds.  Abdominal:     Palpations: Abdomen is soft.     Tenderness: There is abdominal tenderness.     Comments: Mild lower abdominal tenderness bilaterally without rebound rigidity guarding  Genitourinary:    Comments: Pelvic exam performed with RN Renay Carota present Scant amount of blood at the introitus No external lesions No appreciable localization of bleeding Skin:    General: Skin is warm and dry.  Neurological:     Mental Status: She is alert.  Psychiatric:        Mood and Affect: Mood normal.     ED Results / Procedures / Treatments   Labs (all labs ordered are listed, but only abnormal results are displayed) Labs Reviewed  COMPREHENSIVE METABOLIC PANEL WITH GFR - Abnormal; Notable for the following components:      Result Value   BUN 5 (*)    Total Bilirubin 1.3 (*)  All other components within normal limits  URINALYSIS, W/ REFLEX TO CULTURE (INFECTION SUSPECTED) - Abnormal; Notable for the following components:   Hgb urine dipstick LARGE (*)    Ketones, ur 5 (*)    Bacteria, UA MANY (*)    All other components within normal limits  WET PREP, GENITAL  HCG, SERUM, QUALITATIVE  CBC  RPR  HIV ANTIBODY (ROUTINE TESTING W REFLEX)  GC/CHLAMYDIA PROBE AMP ([place]) NOT AT [facility]    EKG None  Radiology US  PELVIC COMPLETE WITH TRANSVAGINAL Result Date: 07/17/2023 CLINICAL DATA:  Vaginal bleeding EXAM: TRANSABDOMINAL AND TRANSVAGINAL ULTRASOUND OF PELVIS TECHNIQUE: Both transabdominal and transvaginal ultrasound examinations of the pelvis were  performed. Transabdominal technique was performed for global imaging of the pelvis including uterus, ovaries, adnexal regions, and pelvic cul-de-sac. It was necessary to proceed with endovaginal exam following the transabdominal exam to visualize the ovaries and endometrium. COMPARISON:  None Available. FINDINGS: Uterus Measurements: 8.1 x 4.2 x 6.2 cm = volume: 110 mL. Diffusely heterogeneous. No focal lesion. Endometrium Thickness: 5 mm.  There is trace fluid in the endometrial canal. Right ovary Measurements: 3.1 x 1.7 x 1.8 cm = volume: 4.9 mL. Normal appearance/no adnexal mass. Left ovary Measurements: 3.2 x 1.2 x 2.3 cm = volume: 4.6 mL. Normal appearance/no adnexal mass. Other findings There is trace free fluid in the pelvis IMPRESSION: 1. Diffusely heterogeneous uterus without focal lesion. Findings can be seen with diffuse fibroid change. 2. Trace fluid in the endometrial canal. The endometrium is nonthickened. 3. Trace free fluid in the pelvis. Electronically Signed   By: Tyron Gallon M.D.   On: 07/17/2023 18:21    Procedures Procedures    Medications Ordered in ED Medications  ibuprofen  (ADVIL ) tablet 600 mg (600 mg Oral Given 07/17/23 1607)  acetaminophen  (TYLENOL ) tablet 975 mg (975 mg Oral Given 07/17/23 1607)  cefTRIAXone  (ROCEPHIN ) injection 500 mg (500 mg Intramuscular Given 07/17/23 2051)  lidocaine  (PF) (XYLOCAINE ) 1 % injection 1-2.1 mL (1.2 mLs Other Given 07/17/23 2051)  metroNIDAZOLE  (FLAGYL ) tablet 2,000 mg (2,000 mg Oral Given 07/17/23 2052)  azithromycin (ZITHROMAX) tablet 1,000 mg (1,000 mg Oral Given 07/17/23 2052)    ED Course/ Medical Decision Making/ A&P Clinical Course as of 07/17/23 2146  Sat Jul 17, 2023  2020 Ultrasound shows no major abnormalities.  Discussed with Dr. Aquilla Knapp (OB/GYN).  He recommends wet prep GC chlamydia swabs given her history of chlamydia and presumptively treat her for PID with Rocephin  send her with Doxy and Flagyl .  Discussed mild spotting  with Dr. Aquilla Knapp as well.  Recommends continuing to monitor with no intervention or starting with Stata.  I spoke with the patient about this and she is okay with continuing to follow given that her bleeding has lightened up.  She will follow-up with OB/GYN in the office [MP]  2145 Wet prep negative for yeast or trichomoniasis BV.  GC chlamydia test HIV RPR all in process and pending.  Will discharge with prescription for Doxy and OB/GYN follow-up [MP]    Clinical Course User Index [MP] Sallyanne Creamer, DO                                 Medical Decision Making 33 year old female history as above presenting to the ED for vaginal bleeding.  Last menstrual period ended on May 6.  Passed 1 large clot and then has continued to have lower abdominal cramping and spotting.  Pelvic exam revealed no localization of bleeding or external lesions.  Sexually active 1 partner.  Low suspicion for STI.  Pregnancy test is negative.  Low suspicion for intrauterine or ectopic pregnancy.  Most likely etiologies would be ovarian cyst, uterine cyst, endometriosis.  Will obtain transvaginal ultrasound here to evaluate and provide tunnel Motrin  for pain relief  Amount and/or Complexity of Data Reviewed Labs: ordered. Radiology: ordered.  Risk OTC drugs. Prescription drug management.           Final Clinical Impression(s) / ED Diagnoses Final diagnoses:  Vaginal bleeding    Rx / DC Orders ED Discharge Orders          Ordered    doxycycline  (VIBRA -TABS) 100 MG tablet  2 times daily        07/17/23 2033              Sallyanne Creamer, DO 07/17/23 2146

## 2023-07-17 NOTE — ED Triage Notes (Signed)
 Pt states she was supposed to have her menstrual cycle earlier this month and she missed it until yesterday she passed a large blood clot and has been bleeding since. Pt also c.o abd pain/cramping.

## 2023-07-17 NOTE — Discharge Instructions (Signed)
 You were seen in the emerged department for vaginal bleeding and cramping Your blood work and ultrasound looked okay We tested you for gonorrhea chlamydia and the results of this test are still pending We treated you for gonorrhea with a shot here We also gave you a dose of antibiotics to treat for chlamydia which will you will need to pick up from your pharmacy doxycycline  twice daily for the next 7 days Pick this up from [facility] on [address] The results of your BV bacterial vaginosis and trichomonas test were negative You need to follow-up with the OB/GYN office at the number listed above They will give you a call and we will see you in the office early this coming week Return to the emergency department for severe abdominal pain, severe bleeding or any other concerns

## 2023-07-18 LAB — RPR: RPR Ser Ql: NONREACTIVE

## 2023-07-19 LAB — GC/CHLAMYDIA PROBE AMP (~~LOC~~) NOT AT ARMC
Chlamydia: NEGATIVE
Comment: NEGATIVE
Comment: NORMAL
Neisseria Gonorrhea: NEGATIVE

## 2023-07-21 ENCOUNTER — Ambulatory Visit: Admitting: Family Medicine
